# Patient Record
Sex: Female | Born: 1970
Health system: Southern US, Community
[De-identification: ages and names within clinical notes are randomized; demographics above are authoritative.]

## PROBLEM LIST (undated history)

## (undated) DIAGNOSIS — E119 Type 2 diabetes mellitus without complications: Secondary | ICD-10-CM

## (undated) DIAGNOSIS — D509 Iron deficiency anemia, unspecified: Secondary | ICD-10-CM

## (undated) DIAGNOSIS — L83 Acanthosis nigricans: Secondary | ICD-10-CM

## (undated) DIAGNOSIS — F419 Anxiety disorder, unspecified: Secondary | ICD-10-CM

## (undated) DIAGNOSIS — N939 Abnormal uterine and vaginal bleeding, unspecified: Secondary | ICD-10-CM

## (undated) DIAGNOSIS — D649 Anemia, unspecified: Secondary | ICD-10-CM

## (undated) DIAGNOSIS — D219 Benign neoplasm of connective and other soft tissue, unspecified: Secondary | ICD-10-CM

## (undated) DIAGNOSIS — R519 Headache, unspecified: Secondary | ICD-10-CM

## (undated) DIAGNOSIS — R51 Headache: Secondary | ICD-10-CM

## (undated) HISTORY — DX: Iron deficiency anemia, unspecified: D50.9

## (undated) HISTORY — DX: Abnormal uterine and vaginal bleeding, unspecified: N93.9

## (undated) HISTORY — DX: Type 2 diabetes mellitus without complications: E11.9

## (undated) HISTORY — PX: TUBAL LIGATION: SHX77

## (undated) HISTORY — PX: CHOLECYSTECTOMY: SHX55

## (undated) HISTORY — DX: Acanthosis nigricans: L83

## (undated) HISTORY — PX: KNEE ARTHROSCOPY: SUR90

---

## 2005-03-28 ENCOUNTER — Emergency Department: Payer: Self-pay | Admitting: Emergency Medicine

## 2006-01-16 ENCOUNTER — Emergency Department: Payer: Self-pay | Admitting: Emergency Medicine

## 2006-01-16 ENCOUNTER — Other Ambulatory Visit: Payer: Self-pay

## 2007-11-13 ENCOUNTER — Emergency Department: Payer: Self-pay | Admitting: Internal Medicine

## 2008-11-03 ENCOUNTER — Emergency Department: Payer: Self-pay | Admitting: Emergency Medicine

## 2009-11-06 ENCOUNTER — Emergency Department: Payer: Self-pay | Admitting: Internal Medicine

## 2010-11-27 ENCOUNTER — Emergency Department: Payer: Self-pay | Admitting: Emergency Medicine

## 2011-08-25 ENCOUNTER — Emergency Department: Payer: Self-pay | Admitting: Emergency Medicine

## 2012-04-16 ENCOUNTER — Emergency Department: Payer: Self-pay | Admitting: Emergency Medicine

## 2015-04-23 ENCOUNTER — Other Ambulatory Visit: Payer: Self-pay

## 2015-04-23 ENCOUNTER — Other Ambulatory Visit: Payer: Self-pay | Admitting: Family Medicine

## 2015-04-23 DIAGNOSIS — Z1231 Encounter for screening mammogram for malignant neoplasm of breast: Secondary | ICD-10-CM

## 2015-04-24 ENCOUNTER — Emergency Department
Admit: 2015-04-24 | Disposition: A | Discharge status: Home / Self Care | Payer: Self-pay | Admitting: Emergency Medicine

## 2015-04-24 LAB — COMPREHENSIVE METABOLIC PANEL
AST: 26 U/L
Albumin: 4 g/dL
Alkaline Phosphatase: 37 U/L — ABNORMAL LOW
Anion Gap: 7 (ref 7–16)
BUN: 5 mg/dL — ABNORMAL LOW
Bilirubin,Total: <0.1 mg/dL — ABNORMAL LOW
CO2: 25 mmol/L
Calcium, Total: 8.2 mg/dL — ABNORMAL LOW
Chloride: 104 mmol/L
Creatinine: 0.61 mg/dL
GLUCOSE: 177 mg/dL — AB
Potassium: 2.9 mmol/L — ABNORMAL LOW
SGPT (ALT): 10 U/L — ABNORMAL LOW
Sodium: 136 mmol/L
Total Protein: 7.5 g/dL

## 2015-04-24 LAB — URINALYSIS, COMPLETE
BACTERIA: NONE SEEN
Bilirubin,UR: NEGATIVE
Glucose,UR: NEGATIVE mg/dL (ref 0–75)
Ketone: NEGATIVE
NITRITE: NEGATIVE
PROTEIN: NEGATIVE
Ph: 7 (ref 4.5–8.0)
Specific Gravity: 1.017 (ref 1.003–1.030)

## 2015-04-24 LAB — CBC WITH DIFFERENTIAL/PLATELET
Basophil #: 0 10*3/uL (ref 0.0–0.1)
Basophil %: 0.7 %
EOS PCT: 2 %
Eosinophil #: 0.1 10*3/uL (ref 0.0–0.7)
HCT: 19.4 % — AB (ref 35.0–47.0)
HGB: 5.2 g/dL — ABNORMAL LOW (ref 12.0–16.0)
LYMPHS ABS: 1.4 10*3/uL (ref 1.0–3.6)
Lymphocyte %: 36.1 %
MCH: 16.4 pg — ABNORMAL LOW (ref 26.0–34.0)
MCHC: 26.7 g/dL — ABNORMAL LOW (ref 32.0–36.0)
MCV: 62 fL — ABNORMAL LOW (ref 80–100)
MONOS PCT: 7.7 %
Monocyte #: 0.3 x10 3/mm (ref 0.2–0.9)
NEUTROS ABS: 2.1 10*3/uL (ref 1.4–6.5)
Neutrophil %: 53.5 %
Platelet: 347 10*3/uL (ref 150–440)
RBC: 3.16 10*6/uL — ABNORMAL LOW (ref 3.80–5.20)
RDW: 24.4 % — ABNORMAL HIGH (ref 11.5–14.5)
WBC: 4 10*3/uL (ref 3.6–11.0)

## 2015-05-14 ENCOUNTER — Encounter
Admission: RE | Admit: 2015-05-14 | Discharge: 2015-05-14 | Disposition: A | Discharge status: Home / Self Care | Payer: Commercial Managed Care - PPO | Source: Ambulatory Visit | Attending: Obstetrics and Gynecology | Admitting: Obstetrics and Gynecology

## 2015-05-14 DIAGNOSIS — Z01810 Encounter for preprocedural cardiovascular examination: Secondary | ICD-10-CM | POA: Insufficient documentation

## 2015-05-14 DIAGNOSIS — Z01812 Encounter for preprocedural laboratory examination: Secondary | ICD-10-CM | POA: Diagnosis present

## 2015-05-14 DIAGNOSIS — D259 Leiomyoma of uterus, unspecified: Secondary | ICD-10-CM | POA: Insufficient documentation

## 2015-05-14 DIAGNOSIS — D649 Anemia, unspecified: Secondary | ICD-10-CM

## 2015-05-14 HISTORY — DX: Anemia, unspecified: D64.9

## 2015-05-14 HISTORY — DX: Headache, unspecified: R51.9

## 2015-05-14 HISTORY — DX: Headache: R51

## 2015-05-14 LAB — CBC
HCT: 26.7 % — ABNORMAL LOW (ref 35.0–47.0)
Hemoglobin: 7.4 g/dL — ABNORMAL LOW (ref 12.0–16.0)
MCH: 20 pg — ABNORMAL LOW (ref 26.0–34.0)
MCHC: 27.6 g/dL — ABNORMAL LOW (ref 32.0–36.0)
MCV: 72.5 fL — ABNORMAL LOW (ref 80.0–100.0)
Platelets: 530 10*3/uL — ABNORMAL HIGH (ref 150–440)
RBC: 3.68 MIL/uL — ABNORMAL LOW (ref 3.80–5.20)
RDW: 34.6 % — AB (ref 11.5–14.5)
WBC: 8.2 10*3/uL (ref 3.6–11.0)

## 2015-05-14 LAB — ABO/RH: ABO/RH(D): O POS

## 2015-05-14 LAB — DIFFERENTIAL
Basophils Absolute: 0 10*3/uL (ref 0–0.1)
Basophils Relative: 1 %
Eosinophils Absolute: 0.2 10*3/uL (ref 0–0.7)
Eosinophils Relative: 2 %
Lymphs Abs: 1.4 10*3/uL (ref 1.0–3.6)
Monocytes Absolute: 0.5 10*3/uL (ref 0.2–0.9)
NEUTROS ABS: 6.1 10*3/uL (ref 1.4–6.5)
Neutrophils Relative %: 74 %

## 2015-05-14 NOTE — Patient Instructions (Signed)
  Your procedure is scheduled on: May 29, 2015 (Friday) Report to Same Day Surgery. To find out your arrival time please call [phone] between 1PM - 3PM on  May 28, 2015, (Thursday)  Remember: Instructions that are not followed completely may result in serious medical risk, up to and including death, or upon the discretion of your surgeon and anesthesiologist your surgery may need to be rescheduled.    __x_ 1. Do not eat food or drink liquids after midnight. No gum chewing or hard candies.     __x__ 2. No Alcohol for 24 hours before or after surgery.   ____ 3. Bring all medications with you on the day of surgery if instructed.    __x__ 4. Notify your doctor if there is any change in your medical condition     (cold, fever, infections).     Do not wear jewelry, make-up, hairpins, clips or nail polish.  Do not wear lotions, powders, or perfumes. You may wear deodorant.  Do not shave 48 hours prior to surgery. Men may shave face and neck.  Do not bring valuables to the hospital.    [facility] is not responsible for any belongings or valuables.               Contacts, dentures or bridgework may not be worn into surgery.  Leave your suitcase in the car. After surgery it may be brought to your room.  For patients admitted to the hospital, discharge time is determined by your                treatment team.   Patients discharged the day of surgery will not be allowed to drive home.   Please read over the following fact sheets that you were given:   Surgical Site Infection Prevention   ____ Take these medicines the morning of surgery with A SIP OF WATER:    1.  2.   3.   4.  5.  6.  ____ Fleet Enema (as directed)   ____ Use CHG Soap as directed  ____ Use inhalers on the day of surgery  ____ Stop metformin 2 days prior to surgery    ____ Take 1/2 of usual insulin dose the night before surgery and none on the morning of surgery.   ____ Stop Coumadin/Plavix/aspirin on   __x__ Stop Anti-inflammatories on (ten days before surgery)  ____ Stop supplements until after surgery.    ____ Bring C-Pap to the hospital.

## 2015-05-19 ENCOUNTER — Ambulatory Visit: Payer: Self-pay

## 2015-05-20 DIAGNOSIS — N939 Abnormal uterine and vaginal bleeding, unspecified: Secondary | ICD-10-CM | POA: Insufficient documentation

## 2015-05-20 HISTORY — DX: Abnormal uterine and vaginal bleeding, unspecified: N93.9

## 2015-05-25 ENCOUNTER — Encounter: Payer: Self-pay | Admitting: Emergency Medicine

## 2015-05-25 ENCOUNTER — Emergency Department
Admission: EM | Admit: 2015-05-25 | Discharge: 2015-05-25 | Disposition: A | Discharge status: Home / Self Care | Payer: Commercial Managed Care - PPO | Attending: Student | Admitting: Student

## 2015-05-25 DIAGNOSIS — Z79899 Other long term (current) drug therapy: Secondary | ICD-10-CM | POA: Insufficient documentation

## 2015-05-25 DIAGNOSIS — R1030 Lower abdominal pain, unspecified: Secondary | ICD-10-CM

## 2015-05-25 DIAGNOSIS — E876 Hypokalemia: Secondary | ICD-10-CM | POA: Insufficient documentation

## 2015-05-25 DIAGNOSIS — D649 Anemia, unspecified: Secondary | ICD-10-CM

## 2015-05-25 DIAGNOSIS — Z87891 Personal history of nicotine dependence: Secondary | ICD-10-CM | POA: Insufficient documentation

## 2015-05-25 DIAGNOSIS — Z3202 Encounter for pregnancy test, result negative: Secondary | ICD-10-CM | POA: Insufficient documentation

## 2015-05-25 DIAGNOSIS — D259 Leiomyoma of uterus, unspecified: Secondary | ICD-10-CM | POA: Insufficient documentation

## 2015-05-25 HISTORY — DX: Benign neoplasm of connective and other soft tissue, unspecified: D21.9

## 2015-05-25 LAB — CBC WITH DIFFERENTIAL/PLATELET
BASOS ABS: 0.1 10*3/uL (ref 0–0.1)
Basophils Relative: 1 %
Eosinophils Absolute: 0.1 10*3/uL (ref 0–0.7)
Eosinophils Relative: 2 %
HCT: 28.6 % — ABNORMAL LOW (ref 35.0–47.0)
Hemoglobin: 8.6 g/dL — ABNORMAL LOW (ref 12.0–16.0)
LYMPHS ABS: 1.5 10*3/uL (ref 1.0–3.6)
Lymphocytes Relative: 16 %
MCH: 22.2 pg — ABNORMAL LOW (ref 26.0–34.0)
MCHC: 29.9 g/dL — AB (ref 32.0–36.0)
MCV: 74.2 fL — ABNORMAL LOW (ref 80.0–100.0)
Monocytes Absolute: 0.5 10*3/uL (ref 0.2–0.9)
Monocytes Relative: 5 %
NEUTROS PCT: 76 %
Neutro Abs: 7.4 10*3/uL — ABNORMAL HIGH (ref 1.4–6.5)
Platelets: 339 10*3/uL (ref 150–440)
RBC: 3.86 MIL/uL (ref 3.80–5.20)
RDW: 30.3 % — AB (ref 11.5–14.5)
WBC: 9.6 10*3/uL (ref 3.6–11.0)

## 2015-05-25 LAB — URINALYSIS COMPLETE WITH MICROSCOPIC (ARMC ONLY)
BILIRUBIN URINE: NEGATIVE
Glucose, UA: NEGATIVE mg/dL
KETONES UR: NEGATIVE mg/dL
Leukocytes, UA: NEGATIVE
Nitrite: NEGATIVE
PROTEIN: 30 mg/dL — AB
Renal Epithelial: 3
SPECIFIC GRAVITY, URINE: 1.021 (ref 1.005–1.030)
Squamous Epithelial / LPF: NONE SEEN
pH: 6 (ref 5.0–8.0)

## 2015-05-25 LAB — COMPREHENSIVE METABOLIC PANEL
ALBUMIN: 3.5 g/dL (ref 3.5–5.0)
ALK PHOS: 33 U/L — AB (ref 38–126)
ALT: 13 U/L — AB (ref 14–54)
AST: 20 U/L (ref 15–41)
Anion gap: 8 (ref 5–15)
BUN: 6 mg/dL (ref 6–20)
CALCIUM: 8.1 mg/dL — AB (ref 8.9–10.3)
CO2: 23 mmol/L (ref 22–32)
Chloride: 101 mmol/L (ref 101–111)
Creatinine, Ser: 0.59 mg/dL (ref 0.44–1.00)
GFR calc non Af Amer: >60 mL/min (ref >60)
Glucose, Bld: 103 mg/dL — ABNORMAL HIGH (ref 65–99)
Potassium: 2.8 mmol/L — CL (ref 3.5–5.1)
SODIUM: 132 mmol/L — AB (ref 135–145)
Total Bilirubin: 0.5 mg/dL (ref 0.3–1.2)
Total Protein: 7.2 g/dL (ref 6.5–8.1)

## 2015-05-25 LAB — LIPASE, BLOOD: LIPASE: 22 U/L (ref 22–51)

## 2015-05-25 LAB — POCT PREGNANCY, URINE: PREG TEST UR: NEGATIVE

## 2015-05-25 MED ORDER — SODIUM CHLORIDE 0.9 % IV BOLUS (SEPSIS)
1000.0000 mL | Freq: Once | INTRAVENOUS | Status: AC
  Administered 2015-05-25: 1000 mL via INTRAVENOUS

## 2015-05-25 MED ORDER — ONDANSETRON HCL 4 MG/2ML IJ SOLN
4.0000 mg | Freq: Once | INTRAMUSCULAR | Status: AC
  Administered 2015-05-25: 4 mg via INTRAVENOUS

## 2015-05-25 MED ORDER — KETOROLAC TROMETHAMINE 30 MG/ML IJ SOLN
30.0000 mg | Freq: Once | INTRAMUSCULAR | Status: AC
  Administered 2015-05-25: 30 mg via INTRAVENOUS

## 2015-05-25 MED ORDER — MORPHINE SULFATE 4 MG/ML IJ SOLN
INTRAMUSCULAR | Status: AC
  Administered 2015-05-25: 4 mg via INTRAVENOUS
  Filled 2015-05-25: qty 1

## 2015-05-25 MED ORDER — MORPHINE SULFATE 4 MG/ML IJ SOLN
4.0000 mg | Freq: Once | INTRAMUSCULAR | Status: AC
  Administered 2015-05-25: 4 mg via INTRAVENOUS

## 2015-05-25 MED ORDER — OXYCODONE HCL 5 MG PO TABS: 5.0000 mg | ORAL_TABLET | Freq: Four times a day (QID) | ORAL | Status: DC | PRN

## 2015-05-25 MED ORDER — KETOROLAC TROMETHAMINE 30 MG/ML IJ SOLN
INTRAMUSCULAR | Status: AC
  Administered 2015-05-25: 30 mg via INTRAVENOUS
  Filled 2015-05-25: qty 1

## 2015-05-25 MED ORDER — POTASSIUM CHLORIDE CRYS ER 20 MEQ PO TBCR
40.0000 meq | EXTENDED_RELEASE_TABLET | Freq: Once | ORAL | Status: AC
  Administered 2015-05-25: 40 meq via ORAL

## 2015-05-25 MED ORDER — ONDANSETRON HCL 4 MG/2ML IJ SOLN
INTRAMUSCULAR | Status: AC
  Administered 2015-05-25: 4 mg via INTRAVENOUS
  Filled 2015-05-25: qty 2

## 2015-05-25 MED ORDER — POTASSIUM CHLORIDE CRYS ER 20 MEQ PO TBCR
EXTENDED_RELEASE_TABLET | ORAL | Status: AC
  Filled 2015-05-25: qty 2

## 2015-05-25 NOTE — ED Provider Notes (Signed)
[facility] Emergency Department Provider Note  ____________________________________________  Time seen: Approximately 4:49 PM  I have reviewed the triage vital signs and the nursing notes.   HISTORY  Chief Complaint Abdominal Pain    HPI QUINESHA SELINGER is a 44 y.o. female with history of anemia and large uterine fibroids presents for evaluation of 3 days of constant waxing and waning lower abdominal/suprapubic pain. She is scheduled for hysterectomy in 4 days for removal of fibroid uterus for treatment of severe anemia related to menorrhagia. Currently the pain is described as severe. No modifying factors. No nausea, vomiting, diarrhea, fevers or chills. She has had vaginal bleeding since May 20 which is improving.   Past Medical History  Diagnosis Date  . Headache   . Anemia   . Fibroids     There are no active problems to display for this patient.   Past Surgical History  Procedure Laterality Date  . Cholecystectomy    . Tubal ligation    . Knee arthroscopy Right     Current Outpatient Rx  Name  Route  Sig  Dispense  Refill  . ferrous sulfate 325 (65 FE) MG tablet   Oral   Take 325 mg by mouth 3 (three) times daily with meals.         Marland Kitchen ibuprofen (ADVIL,MOTRIN) 200 MG tablet   Oral   Take 600 mg by mouth every 6 (six) hours as needed.         . norethindrone (AYGESTIN) 5 MG tablet   Oral   Take 5 mg by mouth daily.           Allergies Review of patient's allergies indicates no known allergies.  No family history on file.  Social History History  Substance Use Topics  . Smoking status: Former Smoker -- 1.00 packs/day    Types: Cigarettes    Quit date: 04/25/2012  . Smokeless tobacco: Never Used  . Alcohol Use: No    Review of Systems Constitutional: No fever/chills Eyes: No visual changes. ENT: No sore throat. Cardiovascular: Denies chest pain. Respiratory: Denies shortness of breath. Gastrointestinal: +abdominal  pain.  No nausea, no vomiting.  No diarrhea.  No constipation. Genitourinary: Negative for dysuria. Musculoskeletal: Negative for back pain. Skin: Negative for rash. Neurological: Negative for headaches, focal weakness or numbness.  10-point ROS otherwise negative.  ____________________________________________   PHYSICAL EXAM:  VITAL SIGNS: ED Triage Vitals  Enc Vitals Group     BP 05/25/15 1634 103/56 mmHg     Pulse Rate 05/25/15 1634 77     Resp 05/25/15 1634 20     Temp 05/25/15 1634 98.4 F (36.9 C)     Temp Source 05/25/15 1634 Oral     SpO2 05/25/15 1634 100 %     Weight 05/25/15 1634 220 lb (99.791 kg)     Height 05/25/15 1634 5\' 4"  (1.626 m)     Head Cir --      Peak Flow --      Pain Score 05/25/15 1635 10     Pain Loc --      Pain Edu? --      Excl. in [place]? --     Constitutional: Alert and oriented. In mild distress secondary to pain. Eyes: Conjunctivae are normal. PERRL. EOMI. Head: Atraumatic. Nose: No congestion/rhinnorhea. Mouth/Throat: Mucous membranes are moist.  Oropharynx non-erythematous. Neck: No stridor.   Cardiovascular: Normal rate, regular rhythm. Grossly normal heart sounds.  Good peripheral circulation. Respiratory: Normal  respiratory effort.  No retractions. Lungs CTAB. Gastrointestinal: Soft with palpable nontender uterus, fundus palpated just under the umbilicus. No distention. No abdominal bruits. No CVA tenderness. Genitourinary: deferred Musculoskeletal: No lower extremity tenderness nor edema.  No joint effusions. Neurologic:  Normal speech and language. No gross focal neurologic deficits are appreciated. Speech is normal. No gait instability. Skin:  Skin is warm, dry and intact. No rash noted. Psychiatric: Mood and affect are normal. Speech and behavior are normal.  ____________________________________________   LABS (all labs ordered are listed, but only abnormal results are displayed)  Labs Reviewed  CBC WITH  DIFFERENTIAL/PLATELET - Abnormal; Notable for the following:    Hemoglobin 8.6 (*)    HCT 28.6 (*)    MCV 74.2 (*)    MCH 22.2 (*)    MCHC 29.9 (*)    RDW 30.3 (*)    Neutro Abs 7.4 (*)    All other components within normal limits  COMPREHENSIVE METABOLIC PANEL - Abnormal; Notable for the following:    Sodium 132 (*)    Potassium 2.8 (*)    Glucose, Bld 103 (*)    Calcium 8.1 (*)    ALT 13 (*)    Alkaline Phosphatase 33 (*)    All other components within normal limits  URINALYSIS COMPLETEWITH MICROSCOPIC ([facility] ONLY) - Abnormal; Notable for the following:    Color, Urine YELLOW (*)    APPearance HAZY (*)    Hgb urine dipstick 3+ (*)    Protein, ur 30 (*)    Bacteria, UA RARE (*)    All other components within normal limits  LIPASE, BLOOD  POC URINE PREG, ED  POCT PREGNANCY, URINE   ____________________________________________  EKG  none ____________________________________________  RADIOLOGY  none ____________________________________________   PROCEDURES  Procedure(s) performed: None  Critical Care performed: No  ____________________________________________   INITIAL IMPRESSION / ASSESSMENT AND PLAN / ED COURSE  Pertinent labs & imaging results that were available during my care of the patient were reviewed by me and considered in my medical decision making (see chart for details).  ----------------------------------------- 8:32 PM on 05/25/2015 -----------------------------------------   [name] is a 44 y.o. female with history of anemia and large uterine fibroids presents for evaluation of 3 days of constant waxing and waning lower abdominal/suprapubic pain secondary to known enlarged fibroid uterus. Labs notable for improving anemia. She also has chronic hypokalemia. K is 2.8 today and her potassium was orally repleted. She has mild hyponatremia. At this point her pain has completely resolved with 4 mg of morphine, 30 mg of Toradol. She is now  sitting up in bed texting on her phone. I discussed the case with Dr. Leafy Ro of OB/GYN and she will have her office contact the patient tomorrow for follow-up appointment. I've discussed extensive return precautions with the patient and she and her husband are comfortable with the discharge plan. Vital signs stable. Afebrile. ____________________________________________   FINAL CLINICAL IMPRESSION(S) / ED DIAGNOSES  Final diagnoses:  Uterine leiomyoma, unspecified location  Lower abdominal pain  Hypokalemia  Anemia, unspecified anemia type      Joanne Gavel, MD 05/25/15 2035

## 2015-05-25 NOTE — ED Notes (Signed)
States abdominal pain x 2 days, has history of uterine fibroid with scheduled hysterectomy and vaginal bleeding since May 20th

## 2015-05-27 NOTE — H&P (Signed)
From [facility] GYN OFFICE VISIT 04/29/2015   Amanda Moran 05-03-1971  HPI: 44 y.o. female, G3P3003, presents with chief complaint of heavy menstrual bleeding leading to microcytic anemia. She was recently seen for a physical and Hgb 5.2 was identified. She was sent to ER at [facility] and monitored. No si/sx of anemia meriting transfusion.  She quantifies this bleeding as: Patient's last menstrual period was 04/21/2015. Menstrual frequency 28 days Length 7 days Pads/day: 10-12 pads Clotting: yes Night time sx yes Overall quality of life impact: Not significantly, she has always dealt with it. Was waiting for menopause.   These symptoms have been present for 5 years. She has tried no treatement in the past for these symptoms without improvement. She has been to the emergency room for this problem in the past.   Anemia symptoms:  Headaches: None Palpitations: None Shortness of breath: None Excessive fatigue: None Dizziness: None Pica: None  Associated symptoms:  Fever, chills, increasing abdominal girth, pelvic pain/pressure, bowel or bladder dysfunction all negative  GYN specific history:  Contraception: BTL STI: recent trichomonas infection Paps: NILM recently, never needed colposcopy Recent sexual activity: Yes-with spouse Desire for future pregnancy: none Weight history: recent weight loss with that is intentional   Associated medical illnesses:  Bleeding disorder, connective tissue disease, liver/renal/heart disease all negative.  Current medications Current Outpatient Prescriptions  Medication Sig  . ferrous sulfate 325 (65 FE) MG tablet Take 325 mg by mouth daily with breakfast.  . norethindrone (AYGESTIN) 5 mg tablet Take 5 mg by mouth once daily.   Herbal medications?: None No use of SSRI, anticoagulants, antipsychotics, tamoxifen.   Family history:  Coagulation or thromboembolic disorders: None Hormone-sensitive cancers: Maternal sister breast, maternal sister aunt,  maternal sister ovarian, mom had cervical cancer.    See the ROS below, but she specifically denies lightheadness, excessive fatigue, or changes in skin color.    Review of Systems: positives in bold GEN:  fevers, chills, weight changes, appetite changes, fatigue, night sweats HEENT: HA, vision changes, hearing loss, congestion, rhinorrhea, sinus pressure, dysphagia CV:  CP, palpitations PULM: SOB, cough GI: abd pain, N/V/D/C GU: dysuria, urgency, frequency MSK: arthralgias, myalgias, back pain, swelling SKIN: rashes, color changes, pallor NEURO: numbness, weakness, tingling, seizures, dizziness, tremors PSYCH: depression, anxiety, behavioral problems, confusion  HEME/LYMPH: easy bruising or bleeding ENDO: heat/cold intolerance   PMH: Past Medical History  Diagnosis Date  . Fibroid   . Anemia     PSH: Past Surgical History  Procedure Laterality Date  . Tubal ligation    . Cholecystectomy       PE: BP 138/70 mmHg  Pulse 106  Ht 162.6 cm (5\' 4" )  Wt 102.059 kg (225 lb)  BMI 38.60 kg/m2  LMP 04/21/2015 GEN: NAD, well-groomed, well-hydrated HEENT: Thyroid normal size, neck supple. No conjunctival pallor.  CV/Pulm: RRR, CTAB ABD: Soft, NT, ND, +BS GU: Normal vulva; Normal, well-estrogenized vaginal tissue; Cervix benign appearing; no CMT; Uterus palpable to 16 cm (likely pedunculated mass); No adnexal masses Ext: 5/5 strength, warm and well-perfused. Cap refill <2 sec.   Previous Imaging: TVUS on 4/29 at [facility]: 13.8 x 10.5 x 9.8 cm uterus with multiple fibroids: 7 x 7 c 5 cm anterior fundal intramural 7 x 6 x 6 cm LUS intramural 4 x 4 x 3 cm right lateral fundal 6 x 7 x 5 cm right lateral pedunculated Normal ovaries with addition of right hydrosalpinx.    Pertinent lab work: CBC 4.0\5.2/347 MCV 62 Cr 0.61  Endometrial  biopsy After verbal consent the  cervix was examined using Graves speculum and found to be benign appearing. The cervix was cleaned with betadine x 3. Hurricane spray was used to provide pain relief. The cervix was grasped with a single-tooth tenaculum. The endometrial biopsy Pipelle was placed to 9 cm depth and sufficient sample was obtained in 2 passes. All instruments were removed from the vagina.   A/P: 44 y.o. presents with large uterine fibroids  1. AUB: In order to treat Ms. Chow's anemia and uterine fibroids we recommend preoperative blood transfusion and minimally invasive total laparoscopic hysterectomy with bilateral salpingectomy, power morcellation, possible abdominal hysterectomy, possible cystoscopy. The risks of this procedure include pain, bleeding, infection, damage to surrounding structures, conversion to open surgery. The methods used to alleviate these risks were discussed and patient consents to this procedure in a manner consistent with informed consent. Pt is agreeable to blood transfusion if necessary to save her life or optimize her health postoperatively. The operative permit and blood consent were signed to reflect this. All questions were answered to Ms. Rusk's satisfaction. Plan for surgery June 3rd. Pt to proceed for anesthesia visit. Plan to continue aygestin and FeSO4 during this time in preparation.   RTC postop  I spent 30 minutes of a 45 minute visit involved in face-to-face counseling regarding this issue.

## 2015-05-28 ENCOUNTER — Ambulatory Visit
Admission: RE | Admit: 2015-05-28 | Discharge: 2015-05-28 | Disposition: A | Discharge status: Home / Self Care | Payer: Commercial Managed Care - PPO | Source: Ambulatory Visit | Attending: *Deleted | Admitting: *Deleted

## 2015-05-28 DIAGNOSIS — Z808 Family history of malignant neoplasm of other organs or systems: Secondary | ICD-10-CM | POA: Diagnosis not present

## 2015-05-28 DIAGNOSIS — N939 Abnormal uterine and vaginal bleeding, unspecified: Secondary | ICD-10-CM | POA: Diagnosis not present

## 2015-05-28 DIAGNOSIS — Z803 Family history of malignant neoplasm of breast: Secondary | ICD-10-CM | POA: Diagnosis not present

## 2015-05-28 DIAGNOSIS — N840 Polyp of corpus uteri: Secondary | ICD-10-CM | POA: Diagnosis not present

## 2015-05-28 DIAGNOSIS — Z8041 Family history of malignant neoplasm of ovary: Secondary | ICD-10-CM | POA: Diagnosis not present

## 2015-05-28 DIAGNOSIS — D251 Intramural leiomyoma of uterus: Secondary | ICD-10-CM | POA: Diagnosis not present

## 2015-05-28 DIAGNOSIS — D6489 Other specified anemias: Secondary | ICD-10-CM | POA: Diagnosis not present

## 2015-05-28 DIAGNOSIS — Z9049 Acquired absence of other specified parts of digestive tract: Secondary | ICD-10-CM | POA: Diagnosis not present

## 2015-05-28 DIAGNOSIS — Z87891 Personal history of nicotine dependence: Secondary | ICD-10-CM | POA: Diagnosis not present

## 2015-05-28 DIAGNOSIS — D649 Anemia, unspecified: Secondary | ICD-10-CM | POA: Diagnosis present

## 2015-05-28 DIAGNOSIS — Z79899 Other long term (current) drug therapy: Secondary | ICD-10-CM | POA: Diagnosis not present

## 2015-05-28 LAB — PREPARE RBC (CROSSMATCH)

## 2015-05-28 LAB — POTASSIUM: POTASSIUM, SERUM: 3.6

## 2015-05-28 MED ORDER — DIPHENHYDRAMINE HCL 25 MG PO CAPS
25.0000 mg | ORAL_CAPSULE | Freq: Once | ORAL | Status: AC
  Administered 2015-05-28: 25 mg via ORAL

## 2015-05-28 MED ORDER — DIPHENHYDRAMINE HCL 25 MG PO CAPS
ORAL_CAPSULE | ORAL | Status: AC
  Filled 2015-05-28: qty 1

## 2015-05-28 MED ORDER — ACETAMINOPHEN 325 MG PO TABS
ORAL_TABLET | ORAL | Status: AC
  Filled 2015-05-28: qty 2

## 2015-05-28 MED ORDER — SODIUM CHLORIDE 0.9 % IV SOLN
INTRAVENOUS | Status: DC
  Administered 2015-05-28: 09:00:00 via INTRAVENOUS

## 2015-05-28 MED ORDER — ACETAMINOPHEN 325 MG PO TABS
650.0000 mg | ORAL_TABLET | Freq: Once | ORAL | Status: AC
  Administered 2015-05-28: 650 mg via ORAL

## 2015-05-29 ENCOUNTER — Ambulatory Visit: Payer: Commercial Managed Care - PPO | Admitting: Anesthesiology

## 2015-05-29 ENCOUNTER — Encounter
Admission: RE | Disposition: A | Discharge status: Home / Self Care | Payer: Self-pay | Source: Ambulatory Visit | Attending: Obstetrics and Gynecology

## 2015-05-29 ENCOUNTER — Observation Stay
Admission: RE | Admit: 2015-05-29 | Discharge: 2015-05-31 | Disposition: A | Discharge status: Home / Self Care | Payer: Commercial Managed Care - PPO | Source: Ambulatory Visit | Attending: Obstetrics and Gynecology | Admitting: Obstetrics and Gynecology

## 2015-05-29 ENCOUNTER — Encounter: Payer: Self-pay | Admitting: Certified Registered Nurse Anesthetist

## 2015-05-29 DIAGNOSIS — Z79899 Other long term (current) drug therapy: Secondary | ICD-10-CM | POA: Insufficient documentation

## 2015-05-29 DIAGNOSIS — Z8041 Family history of malignant neoplasm of ovary: Secondary | ICD-10-CM | POA: Insufficient documentation

## 2015-05-29 DIAGNOSIS — Z87891 Personal history of nicotine dependence: Secondary | ICD-10-CM | POA: Insufficient documentation

## 2015-05-29 DIAGNOSIS — D6489 Other specified anemias: Principal | ICD-10-CM | POA: Insufficient documentation

## 2015-05-29 DIAGNOSIS — Z808 Family history of malignant neoplasm of other organs or systems: Secondary | ICD-10-CM | POA: Insufficient documentation

## 2015-05-29 DIAGNOSIS — N840 Polyp of corpus uteri: Secondary | ICD-10-CM | POA: Insufficient documentation

## 2015-05-29 DIAGNOSIS — D251 Intramural leiomyoma of uterus: Secondary | ICD-10-CM | POA: Insufficient documentation

## 2015-05-29 DIAGNOSIS — D259 Leiomyoma of uterus, unspecified: Secondary | ICD-10-CM | POA: Diagnosis present

## 2015-05-29 DIAGNOSIS — N939 Abnormal uterine and vaginal bleeding, unspecified: Secondary | ICD-10-CM | POA: Insufficient documentation

## 2015-05-29 DIAGNOSIS — Z9049 Acquired absence of other specified parts of digestive tract: Secondary | ICD-10-CM | POA: Insufficient documentation

## 2015-05-29 DIAGNOSIS — D649 Anemia, unspecified: Secondary | ICD-10-CM | POA: Diagnosis present

## 2015-05-29 DIAGNOSIS — Z803 Family history of malignant neoplasm of breast: Secondary | ICD-10-CM | POA: Insufficient documentation

## 2015-05-29 DIAGNOSIS — D252 Subserosal leiomyoma of uterus: Secondary | ICD-10-CM

## 2015-05-29 HISTORY — PX: ABDOMINAL HYSTERECTOMY: SHX81

## 2015-05-29 HISTORY — PX: LAPAROSCOPIC SUPRACERVICAL HYSTERECTOMY: SHX5399

## 2015-05-29 LAB — TYPE AND SCREEN
ABO/RH(D): O POS
ABO/RH(D): O POS
ANTIBODY SCREEN: NEGATIVE
Antibody Screen: NEGATIVE
UNIT DIVISION: 0
UNIT DIVISION: 0

## 2015-05-29 LAB — CBC
HCT: 33 % — ABNORMAL LOW (ref 35.0–47.0)
HEMOGLOBIN: 10.1 g/dL — AB (ref 12.0–16.0)
MCH: 23.6 pg — ABNORMAL LOW (ref 26.0–34.0)
MCHC: 30.5 g/dL — AB (ref 32.0–36.0)
MCV: 77.5 fL — ABNORMAL LOW (ref 80.0–100.0)
Platelets: 216 10*3/uL (ref 150–440)
RBC: 4.26 MIL/uL (ref 3.80–5.20)
RDW: 27 % — ABNORMAL HIGH (ref 11.5–14.5)
WBC: 13 10*3/uL — ABNORMAL HIGH (ref 3.6–11.0)

## 2015-05-29 LAB — BASIC METABOLIC PANEL
Anion gap: 9 (ref 5–15)
BUN: 10 mg/dL (ref 6–20)
CALCIUM: 8.8 mg/dL — AB (ref 8.9–10.3)
CO2: 27 mmol/L (ref 22–32)
Chloride: 103 mmol/L (ref 101–111)
Creatinine, Ser: 0.66 mg/dL (ref 0.44–1.00)
GFR calc non Af Amer: >60 mL/min (ref >60)
GLUCOSE: 165 mg/dL — AB (ref 65–99)
Potassium: 3.4 mmol/L — ABNORMAL LOW (ref 3.5–5.1)
SODIUM: 139 mmol/L (ref 135–145)

## 2015-05-29 LAB — HEMOGLOBIN AND HEMATOCRIT, BLOOD
HCT: 32.4 % — ABNORMAL LOW (ref 35.0–47.0)
Hemoglobin: 10 g/dL — ABNORMAL LOW (ref 12.0–16.0)

## 2015-05-29 LAB — ABO/RH: ABO/RH(D): O POS

## 2015-05-29 SURGERY — HYSTERECTOMY, SUPRACERVICAL, LAPAROSCOPIC
Anesthesia: General | Wound class: Clean Contaminated

## 2015-05-29 MED ORDER — ONDANSETRON HCL 4 MG/2ML IJ SOLN: 4.0000 mg | Freq: Four times a day (QID) | INTRAMUSCULAR | Status: DC | PRN

## 2015-05-29 MED ORDER — KCL IN DEXTROSE-NACL 20-5-0.45 MEQ/L-%-% IV SOLN
INTRAVENOUS | Status: DC
  Administered 2015-05-29 – 2015-05-30 (×4): via INTRAVENOUS
  Filled 2015-05-29 (×8): qty 1000

## 2015-05-29 MED ORDER — ESMOLOL HCL 10 MG/ML IV SOLN
INTRAVENOUS | Status: DC | PRN
  Administered 2015-05-29: 20 mg via INTRAVENOUS
  Administered 2015-05-29 (×3): 10 mg via INTRAVENOUS

## 2015-05-29 MED ORDER — NALOXONE HCL 0.4 MG/ML IJ SOLN: 0.4000 mg | INTRAMUSCULAR | Status: DC | PRN

## 2015-05-29 MED ORDER — EPHEDRINE SULFATE 50 MG/ML IJ SOLN
INTRAMUSCULAR | Status: DC | PRN
  Administered 2015-05-29: 10 mg via INTRAVENOUS

## 2015-05-29 MED ORDER — GLYCOPYRROLATE 0.2 MG/ML IJ SOLN
INTRAMUSCULAR | Status: DC | PRN
  Administered 2015-05-29: .15 mg via INTRAVENOUS

## 2015-05-29 MED ORDER — DIPHENHYDRAMINE HCL 50 MG/ML IJ SOLN: 12.5000 mg | Freq: Four times a day (QID) | INTRAMUSCULAR | Status: DC | PRN

## 2015-05-29 MED ORDER — MIDAZOLAM HCL 2 MG/2ML IJ SOLN
INTRAMUSCULAR | Status: DC | PRN
  Administered 2015-05-29: 2 mg via INTRAVENOUS

## 2015-05-29 MED ORDER — ROCURONIUM BROMIDE 100 MG/10ML IV SOLN
INTRAVENOUS | Status: DC | PRN
  Administered 2015-05-29: 5 mg via INTRAVENOUS
  Administered 2015-05-29: 20 mg via INTRAVENOUS

## 2015-05-29 MED ORDER — LIDOCAINE HCL (CARDIAC) 20 MG/ML IV SOLN
INTRAVENOUS | Status: DC | PRN
  Administered 2015-05-29: 40 mg via INTRAVENOUS

## 2015-05-29 MED ORDER — ONDANSETRON HCL 4 MG/2ML IJ SOLN
INTRAMUSCULAR | Status: AC
  Administered 2015-05-29: 4 mg
  Filled 2015-05-29: qty 2

## 2015-05-29 MED ORDER — MORPHINE SULFATE (PF) 1 MG/ML IV SOLN
INTRAVENOUS | Status: DC
  Administered 2015-05-29: 14:00:00 via INTRAVENOUS
  Filled 2015-05-29: qty 25

## 2015-05-29 MED ORDER — CEFAZOLIN SODIUM-DEXTROSE 2-3 GM-% IV SOLR
INTRAVENOUS | Status: AC
  Administered 2015-05-29: 2 g via INTRAVENOUS
  Filled 2015-05-29: qty 50

## 2015-05-29 MED ORDER — SIMETHICONE 80 MG PO CHEW
80.0000 mg | CHEWABLE_TABLET | Freq: Three times a day (TID) | ORAL | Status: DC
  Administered 2015-05-29 – 2015-05-31 (×7): 80 mg via ORAL
  Filled 2015-05-29 (×7): qty 1

## 2015-05-29 MED ORDER — LACTATED RINGERS IV SOLN
INTRAVENOUS | Status: DC
  Administered 2015-05-29 (×2): via INTRAVENOUS

## 2015-05-29 MED ORDER — ONDANSETRON HCL 4 MG/2ML IJ SOLN
INTRAMUSCULAR | Status: DC | PRN
  Administered 2015-05-29: 4 mg via INTRAVENOUS

## 2015-05-29 MED ORDER — BUPIVACAINE HCL (PF) 0.5 % IJ SOLN
INTRAMUSCULAR | Status: AC
  Filled 2015-05-29: qty 30

## 2015-05-29 MED ORDER — FENTANYL CITRATE (PF) 100 MCG/2ML IJ SOLN
25.0000 ug | INTRAMUSCULAR | Status: DC | PRN
  Administered 2015-05-29 (×4): 25 ug via INTRAVENOUS

## 2015-05-29 MED ORDER — ONDANSETRON HCL 4 MG/2ML IJ SOLN: 4.0000 mg | Freq: Once | INTRAMUSCULAR | Status: DC | PRN

## 2015-05-29 MED ORDER — FENTANYL CITRATE (PF) 100 MCG/2ML IJ SOLN
INTRAMUSCULAR | Status: AC
  Administered 2015-05-29: 25 ug via INTRAVENOUS
  Filled 2015-05-29: qty 2

## 2015-05-29 MED ORDER — SODIUM CHLORIDE 0.9 % IJ SOLN: 9.0000 mL | INTRAMUSCULAR | Status: DC | PRN

## 2015-05-29 MED ORDER — FENTANYL CITRATE (PF) 100 MCG/2ML IJ SOLN
INTRAMUSCULAR | Status: DC | PRN
  Administered 2015-05-29: 25 ug via INTRAVENOUS
  Administered 2015-05-29: 100 ug via INTRAVENOUS
  Administered 2015-05-29: 50 ug via INTRAVENOUS
  Administered 2015-05-29: 25 ug via INTRAVENOUS
  Administered 2015-05-29: 100 ug via INTRAVENOUS
  Administered 2015-05-29: 50 ug via INTRAVENOUS

## 2015-05-29 MED ORDER — CEFAZOLIN SODIUM-DEXTROSE 2-3 GM-% IV SOLR
2.0000 g | Freq: Once | INTRAVENOUS | Status: AC
  Administered 2015-05-29: 2 g via INTRAVENOUS

## 2015-05-29 MED ORDER — PROPOFOL 10 MG/ML IV BOLUS
INTRAVENOUS | Status: DC | PRN
Start: 2015-05-29 — End: 2015-05-29
  Administered 2015-05-29: 170 mg via INTRAVENOUS

## 2015-05-29 MED ORDER — KETOROLAC TROMETHAMINE 30 MG/ML IJ SOLN
30.0000 mg | Freq: Four times a day (QID) | INTRAMUSCULAR | Status: DC
  Administered 2015-05-29 – 2015-05-30 (×4): 30 mg via INTRAVENOUS
  Filled 2015-05-29 (×9): qty 1

## 2015-05-29 MED ORDER — DIPHENHYDRAMINE HCL 12.5 MG/5ML PO ELIX
12.5000 mg | ORAL_SOLUTION | Freq: Four times a day (QID) | ORAL | Status: DC | PRN
  Filled 2015-05-29: qty 5

## 2015-05-29 MED ORDER — DEXAMETHASONE SODIUM PHOSPHATE 4 MG/ML IJ SOLN
INTRAMUSCULAR | Status: DC | PRN
  Administered 2015-05-29: 5 mg via INTRAVENOUS

## 2015-05-29 MED ORDER — SUCCINYLCHOLINE CHLORIDE 20 MG/ML IJ SOLN
INTRAMUSCULAR | Status: DC | PRN
  Administered 2015-05-29: 100 mg via INTRAVENOUS

## 2015-05-29 MED ORDER — KETOROLAC TROMETHAMINE 30 MG/ML IJ SOLN
30.0000 mg | Freq: Four times a day (QID) | INTRAMUSCULAR | Status: DC
  Filled 2015-05-29 (×5): qty 1

## 2015-05-29 MED ORDER — BUPIVACAINE HCL 0.5 % IJ SOLN
INTRAMUSCULAR | Status: DC | PRN
  Administered 2015-05-29: 5 mL

## 2015-05-29 MED ORDER — KETOROLAC TROMETHAMINE 60 MG/2ML IM SOLN
INTRAMUSCULAR | Status: AC
  Administered 2015-05-29: 30 mg
  Filled 2015-05-29: qty 2

## 2015-05-29 SURGICAL SUPPLY — 48 items
BAG URO DRAIN 2000ML W/SPOUT (MISCELLANEOUS) ×4 IMPLANT
BLADE SURG SZ10 CARB STEEL (BLADE) ×4 IMPLANT
BLADE SURG SZ11 CARB STEEL (BLADE) ×8 IMPLANT
BLADE SURG SZ20 CARB STEEL (BLADE) ×4 IMPLANT
CATH FOLEY 2WAY  5CC 16FR (CATHETERS) ×2
CATH URTH 16FR FL 2W BLN LF (CATHETERS) ×2 IMPLANT
CELL SAVER ADDITIONAL TIME PER (MISCELLANEOUS) ×120
CELL SAVER AUTOCOAGULATE (MISCELLANEOUS) ×8
CELL SAVER COLL SVCS (MISCELLANEOUS) ×8
CHLORAPREP W/TINT 26ML (MISCELLANEOUS) ×4 IMPLANT
CLOSURE WOUND 1/2 X4 (GAUZE/BANDAGES/DRESSINGS) ×1
COUNTER NEEDLE 20/40 LG (NEEDLE) ×4 IMPLANT
DRSG TEGADERM 2-3/8X2-3/4 SM (GAUZE/BANDAGES/DRESSINGS) ×4 IMPLANT
ELECT BLADE 6.5 EXT (BLADE) ×4 IMPLANT
FILTER LAP SMOKE EVAC STRL (MISCELLANEOUS) ×4 IMPLANT
GAUZE SPONGE NON-WVN 2X2 STRL (MISCELLANEOUS) ×2 IMPLANT
GLOVE BIO SURGEON STRL SZ8 (GLOVE) ×4 IMPLANT
GOWN STRL REUS W/ TWL LRG LVL3 (GOWN DISPOSABLE) ×2 IMPLANT
GOWN STRL REUS W/ TWL XL LVL3 (GOWN DISPOSABLE) ×2 IMPLANT
GOWN STRL REUS W/TWL LRG LVL3 (GOWN DISPOSABLE) ×2
GOWN STRL REUS W/TWL XL LVL3 (GOWN DISPOSABLE) ×2
HANDLE YANKAUER SUCT BULB TIP (MISCELLANEOUS) ×4 IMPLANT
IV LACTATED RINGERS 1000ML (IV SOLUTION) ×4 IMPLANT
KIT RM TURNOVER CYSTO AR (KITS) ×4 IMPLANT
LABEL OR SOLS (LABEL) ×4 IMPLANT
NS IRRIG 500ML POUR BTL (IV SOLUTION) ×4 IMPLANT
OCCLUDER COLPOPNEUMO (BALLOONS) ×4 IMPLANT
PACK GYN LAPAROSCOPIC (MISCELLANEOUS) ×4 IMPLANT
PAD OB MATERNITY 4.3X12.25 (PERSONAL CARE ITEMS) ×4 IMPLANT
PAD PREP 24X41 OB/GYN DISP (PERSONAL CARE ITEMS) ×4 IMPLANT
PENCIL ELECTRO HAND CTR (MISCELLANEOUS) ×4 IMPLANT
SAVE CELL AUTOCOAG (MISCELLANEOUS) ×4 IMPLANT
SAVER CELL COLL SVCS (MISCELLANEOUS) ×4 IMPLANT
SET CYSTO W/LG BORE CLAMP LF (SET/KITS/TRAYS/PACK) ×4 IMPLANT
SOLUTION ELECTROLUBE (MISCELLANEOUS) ×4 IMPLANT
SPONGE LAP 18X18 5 PK (GAUZE/BANDAGES/DRESSINGS) ×8 IMPLANT
SPONGE VERSALON 2X2 STRL (MISCELLANEOUS) ×2
STRIP CLOSURE SKIN 1/2X4 (GAUZE/BANDAGES/DRESSINGS) ×3 IMPLANT
SUT VIC AB 0 CT1 27 (SUTURE) ×8
SUT VIC AB 0 CT1 27XCR 8 STRN (SUTURE) ×8 IMPLANT
SUT VIC AB 2-0 UR6 27 (SUTURE) ×4 IMPLANT
SUTURE MONOCRYL 2-0 UR6 VIOLOT (SUTURE) ×4 IMPLANT
SYR 50ML LL SCALE MARK (SYRINGE) ×4 IMPLANT
SYRINGE 10CC LL (SYRINGE) ×4 IMPLANT
TIME ADDITIONAL PER CELL SAVER (MISCELLANEOUS) ×60 IMPLANT
TROCAR ENDO BLADELESS 11MM (ENDOMECHANICALS) ×4 IMPLANT
TROCAR XCEL UNIV SLVE 11M 100M (ENDOMECHANICALS) ×4 IMPLANT
TUBING INSUFFLATOR HEATED (MISCELLANEOUS) ×4 IMPLANT

## 2015-05-29 NOTE — H&P (Signed)
H&P Update  Pt was last seen on 5/4 and complete history and physical performed.  The surgical history has been reviewed and remains accurate without interval change. The patient was re-examined and patient's physiologic condition has not changed significantly in the last 30 days.  No new pharmacological allergies or types of therapy has been initiated.  No Known Allergies Current Facility-Administered Medications  Medication Dose Route Frequency Provider Last Rate Last Dose  . ceFAZolin (ANCEF) 2-3 GM-% IVPB SOLR           . ceFAZolin (ANCEF) IVPB 2 g/50 mL premix  2 g Intravenous Once Ruffin Frederick, MD      . lactated ringers infusion   Intravenous Continuous Gunnar Fusi, MD 75 mL/hr at 05/29/15 0802     Facility-Administered Medications Ordered in Other Encounters  Medication Dose Route Frequency Provider Last Rate Last Dose  . 0.9 %  sodium chloride infusion   Intravenous Continuous Ruffin Frederick, MD 20 mL/hr at 05/28/15 [phone]       Past Medical History  Diagnosis Date  . Headache   . Anemia   . Fibroids    Past Surgical History  Procedure Laterality Date  . Cholecystectomy    . Tubal ligation    . Knee arthroscopy Right     BP 124/61 mmHg  Pulse 93  Temp(Src) 98.6 F (37 C) (Oral)  Resp 16  SpO2 100%  NAD RRR no murmurs CTAB, no wheezing, resps unlabored +BS, soft, NTTP No c/c/e Pelvic exam deferred  The above history was confirmed with the patient. The condition still exists that makes this procedure necessary. Surgical plan includes TLH with BS, morcellation likely necesasry confirmed on the consent. The treatment plan remains the same, without new options for care.  The patient understands the potential benefits and risks and the consents have been signed and placed on the chart. We discussed the ~25% likelihood of converting to open surgery and the expected results of that outcome as well. Spent 15 minutes with family asking all questions.    Amanda Moran. Archie Balboa MD, [facility] [phone]

## 2015-05-29 NOTE — Transfer of Care (Signed)
Immediate Anesthesia Transfer of Care Note  Patient: Amanda Moran  Procedure(s) Performed: Procedure(s) with comments: LAPAROSCOPIC SUPRACERVICAL HYSTERECTOMY (N/A) - attempted HYSTERECTOMY ABDOMINAL  Patient Location: PACU  Anesthesia Type:General  Level of Consciousness: awake, oriented and patient cooperative  Airway & Oxygen Therapy: Patient Spontanous Breathing and Patient connected to face mask oxygen  Post-op Assessment: Report given to RN and Post -op Vital signs reviewed and stable  Post vital signs: Reviewed and stable  Last Vitals:  Filed Vitals:   05/29/15 0754  BP: 124/61  Pulse: 93  Temp: 37 C  Resp: 16    Complications: No apparent anesthesia complications

## 2015-05-29 NOTE — Anesthesia Postprocedure Evaluation (Signed)
  Anesthesia Post-op Note  Patient: Amanda Moran  Procedure(s) Performed: Procedure(s) with comments: LAPAROSCOPIC SUPRACERVICAL HYSTERECTOMY (N/A) - attempted HYSTERECTOMY ABDOMINAL  Anesthesia type:General  Patient location: PACU  Post pain: Pain level controlled  Post assessment: Post-op Vital signs reviewed, Patient's Cardiovascular Status Stable, Respiratory Function Stable, Patent Airway and No signs of Nausea or vomiting  Post vital signs: Reviewed and stable  Last Vitals:  Filed Vitals:   05/29/15 0754  BP: 124/61  Pulse: 93  Temp: 37 C  Resp: 16    Level of consciousness: awake, alert  and patient cooperative  Complications: No apparent anesthesia complications

## 2015-05-29 NOTE — Op Note (Signed)
Operatative Note  Pre-Op Dx:   1. Uterine fibroids 2. Anemia 3. Abnormal uterine bleeding-L  Post-Op Dx:   same  Procedure:   Diagnostic laparoscopy  Total abdominal hysterectomy  Bilateral salpingectomy   Surgeon:  Nathaneil Canary MD, MPH Assistant(s):  Laverta Baltimore Staff:  Circulator: Lazaro Arms, RN Scrub Person: Judithe Modest  Anesthesia:  Randolm Idol  Antibiotics:  Ancef 2g  at 0848 Incision Time:  0945 End Time: 1148 DVT prophylaxis: SCDs   EBL:  575cc (250 cc given back via cell-saver) IVF:  1850 cc LR UOP: 600 cc Drains: Foley catheter  Specimen(s):    ID Type Source Tests Collected by Time Destination  1 : uerus with cervix Tissue [facility] benign resection SURGICAL PATHOLOGY Ruffin Frederick, MD 05/29/2015 1101     Findings:  ASA 2. Wound class 2 Uterus: Larger than anticipated by TVUS. Uterus 18 cm rather than the 16 cm as previously palpated in the office. This was more easily palpable under anesthesia. What had been described as 13 cm uterus with pedunculated fibroid wasn't pedunculated at all, but was rather a large subserosal fibroid. The decision was made to convert to open surgery which was anticipated and discussed at length with family. Ovaries and Tubes: Normal, clips in place Adhesions: none   Complications:  none   Disposition:  Extubated to PACU.   Technique:   After a satisfactory level of general anesthesia was achieved and the patient in supine position, SCD stockings were placed and turned on.  Preoperative antibiotics had been given.  A time out was performed.  Her abdomen was prepped with Alcohol and the vulva and vagina were widely prepped with Hibaclens.  A foley catheter was placed into the bladder.  The abdomen was draped out with sterile drapes.  Uterus sounded to 18 cm. Medium V-care was placed.  Laparoscopy was started. The umbilicus was infiltrated with 5 cc of dilute marcaine. 11 mm incision was made. The 10 mm  operative scope was placed under direct visualization using an optical trocar and sleeve. Once intraperitoneal access was obtained, the abdomen was insufflated. A prohibitively enlarged uterus was noted as described above, and the decision was made to convert to open incision. The abdomen was deflated, the fascia was closed with 2-0 Vicryl, and skin closed with 4-0 Monocryl. V-care was removed.   The abdomen was entered through a Pfannenstiel incision and hemostasis achieved with Bovie electrosurgery.  The parietal peritoneum was elevated and entered sharply and incised in a vertical fashion.  The abdomen and pelvis were inspected with findings as noted above.   All suture ligatures were 0-Vicryl unless otherwise specified, with larger pedicles transfixed, and Heaney clamps were used.    Large Kelly clamps were placed along the lateral walls of the uterus and used for traction and to control back-bleeding.  The round ligaments were sutured and divided in their mid section, opening up the broad ligament.  The adnexal pedicles were bluntly isolated and clamped, cut and doubly ligated with a suture ligature doubly. The ureter was identified inferior and posterior to the ligation site.  The anterior leaf of the broad ligament was now entered and incised inferiorly and the bladder flap developed with sharp and blunt dissection. The uterine vessels were skeletonized and the uterine arteries clamped with and divided, high on the uterus due to it's large size. Care was taken to avoid back bleeding. The uterus was followed down and successive clamp, cut, tie sequences were used to  reach the uterine artery at the cardinal ligament. The bulky uterus was then amputated using scalpel.  Straight Heaney clamps were now used to divide the cervix from the cardinal ligaments, and approximately 5 clamps were required on each side.  The ureters were identified by palpation and were lateral and inferior to the operative site.     Once the cervicovaginal junction was reached, curved Heaney clamps were placed across the vaginal angles and the uterus and cervix transected from the cuff with the [employer].   The angles were sutured to affix the cuff to the lower cardinal and uterosacral ligaments and held.  The remainder of the cuff was closed with 2 interrupted figure of 8 sutures.  The pelvis was irrigated thoroughly and hemostasis assured.    The subfascial and subcutaneous tissues were extensively irrigated and hemostasis assured.  Hemostasis was achieved with 2 additional figure of 8 sutures at the left pedicle. 3 g of Arista was used on the raw peritoneal edge bilaterally as a prophylactic measure.The rectus fascia was closed with 0-Vicryl.  The subcutaneous tissue was re-approximated with several interrupted sutures of 0- Vicryl suture and the skin was closed with 4-0 Monocryl suture subcuticularly.  Sterile dressing applied.    Final sponge, needle, and instrument counts were correct times three.  Ending time  was performed.  The patient was now extubated without difficulty and taken to the PACU in stable condition.

## 2015-05-29 NOTE — Anesthesia Procedure Notes (Signed)
Procedure Name: Intubation Performed by: Kennon Holter Pre-anesthesia Checklist: Patient identified, Timeout performed, Emergency Drugs available, Suction available and Patient being monitored Patient Re-evaluated:Patient Re-evaluated prior to inductionOxygen Delivery Method: Circle system utilized Preoxygenation: Pre-oxygenation with 100% oxygen Intubation Type: IV induction Ventilation: Mask ventilation without difficulty Laryngoscope Size: 3 Grade View: Grade I Tube type: Oral Number of attempts: 1 Airway Equipment and Method: Stylet

## 2015-05-29 NOTE — Anesthesia Preprocedure Evaluation (Addendum)
Anesthesia Evaluation  Patient identified by MRN, date of birth, ID band Patient awake    Reviewed: Allergy & Precautions, NPO status , Patient's Chart, lab work & pertinent test results  History of Anesthesia Complications Negative for: history of anesthetic complications  Airway Mallampati: II  TM Distance: >3 FB Neck ROM: Full    Dental  (+) Implants, Dental Advisory Given,    Pulmonary former smoker (quit x 3 yrs),          Cardiovascular     Neuro/Psych  Headaches (occ.),    GI/Hepatic   Endo/Other    Renal/GU      Musculoskeletal   Abdominal   Peds  Hematology  (+) anemia ,   Anesthesia Other Findings   Reproductive/Obstetrics                            Anesthesia Physical Anesthesia Plan  ASA: II  Anesthesia Plan: General   Post-op Pain Management:    Induction: Intravenous  Airway Management Planned: Oral ETT  Additional Equipment:   Intra-op Plan:   Post-operative Plan:   Informed Consent: I have reviewed the patients History and Physical, chart, labs and discussed the procedure including the risks, benefits and alternatives for the proposed anesthesia with the patient or authorized representative who has indicated his/her understanding and acceptance.     Plan Discussed with:   Anesthesia Plan Comments:         Anesthesia Quick Evaluation

## 2015-05-29 NOTE — Progress Notes (Signed)
Daily Progress Note  Service:  [facility] Day:   LOS: 0 days  Attending:  Ruffin Frederick, MD    Subjective:                                                                                          The patient feels tired.  Pain is well controlled with current medications. Nausea/vomiting:  no nausea, no vomiting. Urinary output is adequate via Foley. Flatus: None. Ambulating:None The patient is not tolerating a normal diet.   ROS:  Denies HA, CP, SOB, FC, NV, dizziness, lightheadedness, leg tenderness.     Objective:    24hour vital signs:  Temp:  [97.5 F (36.4 C)-98.6 F (37 C)] 98.2 F (36.8 C) (06/03 1700) Pulse Rate:  [60-137] 62 (06/03 1700) Resp:  [8-24] 18 (06/03 1700) BP: (122-138)/(61-80) 132/62 mmHg (06/03 1700) SpO2:  [96 %-100 %] 100 % (06/03 1700)  SpO2 Readings from Last 1 Encounters:  05/29/15 100%        General:    alert and no distress  HEENT:   NCAT, MMM, OP clear, EOMI  Cardiovascular:   RRR, no murmurs  Pulmonary:   CTAB, no wheezing, no crackles, resps unlabored  Abdomen:  absent, no TTP, no distension  Incision:  healing well, no significant drainage, no dehiscence, no significant erythema  Extremities:  no edema, No evidence of DVT seen on physical exam.    Labs: Recent Labs     05/29/15  0736  05/29/15  1348  WBC   --   13.0*  HGB  10.0*  10.1*  HCT  32.4*  33.0*  MCV   --   77.5*  PLT   --   216   Recent Labs     05/29/15  1348  NA  139  K  3.4*  CL  103  CO2  27  BUN  10  CREATININE  0.66  CALCIUM  8.8*   No results for input(s): BILITOT, BILIDIR, ALKPHOS, AST, ALT, PROT in the last 72 hours.  Invalid input(s): LABALB  Medications: Scheduled Meds: . ketorolac  30 mg Intravenous 4 times per day   Or  . ketorolac  30 mg Intramuscular 4 times per day  . morphine   Intravenous 6 times per day  . simethicone  80 mg Oral TID PC & HS    Continuous Infusions: . dextrose 5 % and 0.45 % NaCl with KCl 20  mEq/L 125 mL/hr at 05/29/15 1504    PRN Meds:diphenhydrAMINE **OR** diphenhydrAMINE, naloxone **AND** sodium chloride, ondansetron (ZOFRAN) IV  .Assessment:   44 y.o. POD #0 s/p TAH for large fibroid uterus, EBL 575 cc. Doing well postoperatively.  Plan:  1. Post-op: - Pain: well controlled on current regimen of Toradol and Morphine PCA - CVS:  No Issues - Pulm:  IS bedside - GI: Clears, ADAT. No N/V. Awaiting flatus.  Zofran PRN.  Add Colace BID - GU: Foley in place. Adeq UOP. D/c Foley in am.   - Heme:  preop Hgb 10.0;  Postop Hgb in the AM, given 250 cc blood back via  cell saver. Acute blood loss anemia on top of chronic anemia  - ID:  Pt received antibiotics at the appropriate intervals during her surgery. No current signs or symptoms of infection. - FEN:  MIVF 125 cc/hr - Endocrine:  No issues - DVT ppx: SCD's,  ambulate TID - Wound: Healing appropriately after surgery. - Path: Discussed operative findings with patient and her family. All questions answered.   3. Dispo:  - Anticipate discharge home on POD #2-3

## 2015-05-30 DIAGNOSIS — D6489 Other specified anemias: Secondary | ICD-10-CM | POA: Diagnosis not present

## 2015-05-30 MED ORDER — HYDROCODONE-ACETAMINOPHEN 5-325 MG PO TABS
1.0000 | ORAL_TABLET | ORAL | Status: DC | PRN
  Administered 2015-05-30 (×3): 1 via ORAL
  Administered 2015-05-30: 2 via ORAL
  Filled 2015-05-30: qty 2
  Filled 2015-05-30 (×2): qty 1
  Filled 2015-05-30: qty 2
  Filled 2015-05-30: qty 1

## 2015-05-30 MED ORDER — IBUPROFEN 800 MG PO TABS
800.0000 mg | ORAL_TABLET | Freq: Three times a day (TID) | ORAL | Status: DC
  Administered 2015-05-30 – 2015-05-31 (×2): 800 mg via ORAL
  Filled 2015-05-30 (×3): qty 1

## 2015-05-30 MED ORDER — MORPHINE SULFATE 2 MG/ML IJ SOLN
2.0000 mg | INTRAMUSCULAR | Status: DC | PRN
Start: 2015-05-30 — End: 2015-05-31

## 2015-05-30 NOTE — Progress Notes (Signed)
Daily Progress Note  Service:  [facility] Day:   LOS: 1 day  Attending:  Ruffin Frederick, MD    Subjective:                                                                                          The patient feels great.  Pain is well controlled with current medications. Nausea/vomiting:  no nausea, no vomiting. Urinary output is adequate via Foley. Flatus: None, yet Ambulating:In the room The patient is tolerating a normal diet.   ROS:  Denies HA, CP, SOB, FC, NV, dizziness, lightheadedness, leg tenderness.     Objective:    24hour vital signs:  Temp:  [97.5 F (36.4 C)-98.6 F (37 C)] 98.6 F (37 C) (06/04 0328) Pulse Rate:  [60-137] 79 (06/04 0328) Resp:  [8-24] 19 (06/04 0750) BP: (109-138)/(57-80) 125/68 mmHg (06/04 0328) SpO2:  [96 %-100 %] 100 % (06/04 0328)  SpO2 Readings from Last 1 Encounters:  05/30/15 100%    I/O last 3 completed shifts: In: 3664 [I.V.:2960; Blood:250] Out: 4034 [Urine:2150; Blood:1075]   General:    alert and no distress  HEENT:   NCAT, MMM, OP clear, EOMI  Cardiovascular:   RRR, no murmurs  Pulmonary:   CTAB, no wheezing, no crackles, resps unlabored  Abdomen:  present, no TTP, no distension  Incision:  healing well, no significant drainage, no dehiscence, no significant erythema  Extremities:  no edema, No evidence of DVT seen on physical exam.    Labs: Recent Labs     05/29/15  0736  05/29/15  1348  WBC   --   13.0*  HGB  10.0*  10.1*  HCT  32.4*  33.0*  MCV   --   77.5*  PLT   --   216   Recent Labs     05/29/15  1348  NA  139  K  3.4*  CL  103  CO2  27  BUN  10  CREATININE  0.66  CALCIUM  8.8*   No results for input(s): BILITOT, BILIDIR, ALKPHOS, AST, ALT, PROT in the last 72 hours.  Invalid input(s): LABALB  Medications: Scheduled Meds: . ibuprofen  800 mg Oral TID  . ketorolac  30 mg Intravenous 4 times per day   Or  . ketorolac  30 mg Intramuscular 4 times per day  . simethicone  80 mg  Oral TID PC & HS    Continuous Infusions: . dextrose 5 % and 0.45 % NaCl with KCl 20 mEq/L 125 mL/hr at 05/30/15 0337    PRN Meds:HYDROcodone-acetaminophen, morphine injection  .Assessment:   44 y.o. POD #1 s/p TAH for large fibroid uterus, EBL 575 cc. Doing well postoperatively.  Plan:  1. Post-op: - Pain: well controlled on current regimen of Toradol and Morphine PCA, plan to change to Motrin, Norco with Morphine for breakthrough - CVS:  No Issues - Pulm:  IS bedside - GI: regular diet. No N/V. Awaiting flatus.  Zofran PRN.  Added Colace BID - GU: Needed, 500 cc bolus overnight, otherwise adequate UOP. D/C foley today, voiding trial   -  Heme:  preop Hgb 10.0;  Postop Hgb 10.1 given 250 cc blood back via cell saver. Acute blood loss anemia on top of chronic anemia  - ID:  Pt received antibiotics at the appropriate intervals during her surgery. No current signs or symptoms of infection. - FEN:  MIVF decrease to 75 cc/hr - Endocrine:  No issues - DVT ppx: SCD's,  ambulate TID - Wound: Healing appropriately after surgery. - Path: Discussed operative findings with patient and her family. All questions answered.   3. Dispo:  - Anticipate discharge home on POD #2

## 2015-05-30 NOTE — Progress Notes (Signed)
Bladder scan post void with output of 200 ml and zero residual

## 2015-05-31 DIAGNOSIS — D6489 Other specified anemias: Secondary | ICD-10-CM | POA: Diagnosis not present

## 2015-05-31 DIAGNOSIS — D649 Anemia, unspecified: Secondary | ICD-10-CM | POA: Diagnosis present

## 2015-05-31 MED ORDER — OXYCODONE-ACETAMINOPHEN 5-325 MG PO TABS: 1.0000 | ORAL_TABLET | ORAL | Status: DC | PRN

## 2015-05-31 MED ORDER — DOCUSATE SODIUM 100 MG PO CAPS: 100.0000 mg | ORAL_CAPSULE | Freq: Two times a day (BID) | ORAL | Status: DC

## 2015-05-31 MED ORDER — ALUM & MAG HYDROXIDE-SIMETH 200-200-20 MG/5ML PO SUSP: 30.0000 mL | ORAL | Status: DC | PRN

## 2015-05-31 MED ORDER — ONDANSETRON HCL 4 MG/2ML IJ SOLN: 4.0000 mg | Freq: Four times a day (QID) | INTRAMUSCULAR | Status: DC | PRN

## 2015-05-31 MED ORDER — DOCUSATE SODIUM 100 MG PO CAPS
100.0000 mg | ORAL_CAPSULE | Freq: Two times a day (BID) | ORAL | Status: DC
  Administered 2015-05-31: 100 mg via ORAL
  Filled 2015-05-31: qty 1

## 2015-05-31 MED ORDER — HYDROMORPHONE HCL 1 MG/ML IJ SOLN: 0.2000 mg | INTRAMUSCULAR | Status: DC | PRN

## 2015-05-31 MED ORDER — OXYCODONE-ACETAMINOPHEN 5-325 MG PO TABS
1.0000 | ORAL_TABLET | ORAL | Status: DC | PRN
  Administered 2015-05-31: 1 via ORAL
  Filled 2015-05-31 (×2): qty 2

## 2015-05-31 MED ORDER — IBUPROFEN 800 MG PO TABS: 800.0000 mg | ORAL_TABLET | Freq: Three times a day (TID) | ORAL | Status: DC

## 2015-05-31 MED ORDER — ACETAMINOPHEN 325 MG PO TABS: 650.0000 mg | ORAL_TABLET | ORAL | Status: DC | PRN

## 2015-05-31 MED ORDER — PRENATAL MULTIVITAMIN CH: 1.0000 | ORAL_TABLET | Freq: Every day | ORAL | Status: DC

## 2015-05-31 MED ORDER — ONDANSETRON HCL 4 MG PO TABS: 4.0000 mg | ORAL_TABLET | Freq: Four times a day (QID) | ORAL | Status: DC | PRN

## 2015-05-31 NOTE — Discharge Summary (Signed)
Physician Discharge Summary  Patient ID: Amanda Moran MRN: [id number] DOB/AGE: 1971/12/01 44 y.o.  Admit date: 05/29/2015 Discharge date: 05/31/2015  Admission Diagnoses: Uterine Fibroids leading to AUB-L, chronic anemia  Discharge Diagnoses:  Active Problems:   Chronic anemia   Discharged Condition: good  Hospital Course: Ms. Ulloa was brought in the day before surgery and received 2 U PRBC for a preop Hgb of 5. It had temporarily increased on 8 in the interim between scheduling and surgery. The patient was admitted on the day of scheduled surgery and proceeded to the operating room as scheduled for the below stated procedure without complications, EBL 768GS with 250 given back.  (For complete operative information, please see operative report).  Postoperatively she was admitted to the floor. Pain was initially managed with Morphine PCA which was transitioned to PO Motrin and Percocet when the patient was tolerating a regular diet on postoperative day number 1.  Postoperative labs were stable (Hgb 10 --> 10 and creatinine 0.66 )  Foley cath was discontinued on postoperative day number 1 and patient passed a voiding trial without difficulty.   Patient was felt to be stable for discharge on postoperative day number 2 when she was tolerating a regular diet, pain was controlled with po pain medications, and she was ambulating and voiding without difficulty. Vital signs were stable and physical exam remained benign throughout her hospital stay. Incision was clean,dry, and intact with sutures.  She will follow up per below for post-op check in two weeks.  Rx given for Percocet, Motrin, and Colace.    She was given specific instructions and numbers to call in written and verbal format. She verbalized understanding, agrees with the plan of care, and all questions answered to her satisfaction.   Consults: None  Significant Diagnostic Studies: See summary  Treatments: IV hydration, analgesia:  Morphine and Motrin, Toradol, Percocet and surgery: TAH, BS  Discharge Exam: Blood pressure 114/70, pulse 68, temperature 98.4 F (36.9 C), temperature source Oral, resp. rate 18, SpO2 100 %. General:  alert and no distress  HEENT:  NCAT, MMM, OP clear, EOMI  Cardiovascular:  RRR, no murmurs  Pulmonary:  CTAB, no wheezing, no crackles, resps unlabored  Abdomen: present, no TTP, no distension  Incision: healing well, no significant drainage, no dehiscence, no significant erythema  Extremities: no edema, No evidence of DVT seen on physical exam.         Disposition: 01-Home or Self Care      Discharge Instructions    Discharge patient    Complete by:  As directed             Medication List    STOP taking these medications        oxyCODONE 5 MG immediate release tablet  Commonly known as:  ROXICODONE      TAKE these medications        docusate sodium 100 MG capsule  Commonly known as:  COLACE  Take 1 capsule (100 mg total) by mouth 2 (two) times daily.     ferrous sulfate 325 (65 FE) MG tablet  Take 325 mg by mouth 3 (three) times daily with meals.     ibuprofen 200 MG tablet  Commonly known as:  ADVIL,MOTRIN  Take 600 mg by mouth every 6 (six) hours as needed for headache or mild pain.     ibuprofen 800 MG tablet  Commonly known as:  ADVIL,MOTRIN  Take 1 tablet (800 mg total) by mouth 3 (three)  times daily.     oxyCODONE-acetaminophen 5-325 MG per tablet  Commonly known as:  PERCOCET/ROXICET  Take 1-2 tablets by mouth every 3 (three) hours as needed (moderate to severe pain (when tolerating fluids)).       Follow-up Information    Follow up with GOODMAN, Clayburn Pert, MD In 2 weeks.   Specialty:  Obstetrics and Gynecology   Contact information:   [place] [address] [phone]       Signed: Ruffin Frederick 05/31/2015, 7:35 AM

## 2015-05-31 NOTE — Discharge Instructions (Signed)
Signs and Symptoms to Report Call our office at [phone] if you have any of the following.   Fever over 100.4 degrees or higher  Severe stomach pain not relieved with pain medications  Bright red bleeding thats heavier than a period that does not slow with rest  To go the bathroom a lot (frequency), you cant hold your urine (urgency), or it hurts when you empty your bladder (urinate)  Chest pain  Shortness of breath  Pain in the calves of your legs  Severe nausea and vomiting not relieved with anti-nausea medications  Signs of infection around your wounds, such as redness, hot to touch, swelling, green/yellow drainage (like pus), bad smelling discharge  Any concerns  What You Can Expect after Surgery  You may see some pink tinged, bloody fluid and bruising around the wound. This is normal.  You may notice shoulder and neck pain. This is caused by the gas used during surgery to expand your abdomen so your surgeon could get to the uterus easier.  You may have a sore throat because of the tube in your mouth during general anesthesia. This will go away in 2 to 3 days.  You may have some stomach cramps.  You may notice spotting on your panties.  You may have pain around the incision sites.   Activities after Your Discharge Follow these guidelines to help speed your recovery at home:  Do the coughing and deep breathing as you did in the hospital for 2 weeks. Use the small blue breathing device, called the incentive spirometer for 2 weeks.  Dont drive if you are in pain or taking narcotic pain medicine. You may drive when you can safely slam on the brakes, turn the wheel forcefully, and rotate your torso comfortably. This is typically 1-2 weeks. Practice in a parking lot or side street prior to attempting to drive regularly.   Ask others to help with household chores for 4 weeks.  Do not lift anything heavier that 10 pounds for 4-6 weeks. This includes pets,  children, and groceries.  Dont do strenuous activities, exercises, or sports like vacuuming, tennis, squash, etc. until your doctor says it is safe to do so. ---If you had a hysterectomy (abdominal, laparoscopic, or vaginal) do not have intercourse for 8-10 weeks.   Walk as you feel able. Rest often since it may take two or three weeks for your energy level to return to normal.   You may climb stairs  Avoid constipation:   -Eat fruits, vegetables, and whole grains. Eat small meals as your appetite will take time to return to normal.   -Drink 6 to 8 glasses of water each day unless your doctor has told you to limit your fluids.   -Use a laxative or stool softener as needed if constipation becomes a problem. You may take Miralax, metamucil, Citrucil, Colace, Senekot, FiberCon, etc. If this does not relieve the constipation, try two tablespoons of Milk Of Magnesia every 8 hours until your bowels move.   You may shower. Gently wash the wounds with a mild soap and water. Pat dry.  Do not get in a hot tub, swimming pool, etc. until your doctor agrees.  Do not use lotions, oils, powders on the wounds.  Do not douche, use tampons, or have sex until your doctor says it is okay.  Take your pain medicine when you need it. The medicine may not work as well if the pain is bad.  Take the medicines you were  taking before surgery. Other medications you will need are pain medications (Norco or Percocet) and nausea medications (Zofran).   Call your doctor for increased pain or vaginal bleeding, temperature above 100.4, depression, or concerns.  No strenuous activity or heavy lifting for 6 weeks.  No intercourse, tampons, douching, or enemas for 6 weeks.  No tub baths-showers only.  No driving for 2 weeks or while taking percocet.  Keep incision clean and dry.  Call your doctor for incision concerns including redness, swelling, bleeding or drainage, or if begins to come apart.

## 2015-06-01 LAB — SURGICAL PATHOLOGY

## 2015-06-23 LAB — POCT PREGNANCY, URINE: PREG TEST UR: NEGATIVE

## 2015-07-31 ENCOUNTER — Ambulatory Visit: Payer: Self-pay | Admitting: Family Medicine

## 2015-08-06 ENCOUNTER — Other Ambulatory Visit: Payer: Self-pay | Admitting: Family Medicine

## 2015-08-06 ENCOUNTER — Ambulatory Visit (INDEPENDENT_AMBULATORY_CARE_PROVIDER_SITE_OTHER): Payer: Commercial Managed Care - PPO | Admitting: Family Medicine

## 2015-08-06 ENCOUNTER — Encounter: Payer: Self-pay | Admitting: Family Medicine

## 2015-08-06 DIAGNOSIS — E119 Type 2 diabetes mellitus without complications: Secondary | ICD-10-CM | POA: Diagnosis not present

## 2015-08-06 DIAGNOSIS — R748 Abnormal levels of other serum enzymes: Secondary | ICD-10-CM

## 2015-08-06 DIAGNOSIS — R7301 Impaired fasting glucose: Secondary | ICD-10-CM | POA: Insufficient documentation

## 2015-08-06 DIAGNOSIS — Z1231 Encounter for screening mammogram for malignant neoplasm of breast: Secondary | ICD-10-CM

## 2015-08-06 DIAGNOSIS — D500 Iron deficiency anemia secondary to blood loss (chronic): Secondary | ICD-10-CM | POA: Insufficient documentation

## 2015-08-06 LAB — CBC WITH DIFFERENTIAL/PLATELET
Hematocrit: 36.4 % (ref 34.0–46.6)
Hemoglobin: 11.5 g/dL (ref 11.1–15.9)
Lymphocytes Absolute: 1.9 10*3/uL (ref 0.7–3.1)
Lymphs: 29 %
MCH: 26.4 pg — ABNORMAL LOW (ref 26.6–33.0)
MCHC: 31.6 g/dL (ref 31.5–35.7)
MCV: 84 fL (ref 79–97)
MID (Absolute): 0.4 10*3/uL (ref 0.1–1.6)
MID: 7 %
NEUTROS ABS: 4.4 10*3/uL (ref 1.4–7.0)
Neutrophils: 65 %
PLATELETS: 237 10*3/uL (ref 150–379)
RBC: 4.35 x10E6/uL (ref 3.77–5.28)
RDW: 20.5 % — ABNORMAL HIGH (ref 12.3–15.4)
WBC: 6.7 10*3/uL (ref 3.4–10.8)

## 2015-08-06 LAB — MICROALBUMIN, URINE WAIVED
CREATININE, URINE WAIVED: 300 mg/dL (ref 10–300)
MICROALB, UR WAIVED: 30 mg/L — AB (ref 0–19)
Microalb/Creat Ratio: <30 mg/g (ref <30)

## 2015-08-06 LAB — BAYER DCA HB A1C WAIVED: HB A1C (BAYER DCA - WAIVED): 5.4 % (ref <7.0)

## 2015-08-06 NOTE — Assessment & Plan Note (Addendum)
A1c 6.5 at last check in May. On recheck today, it came back at 5.4! Keep watching what she's eating and working on diet and exercise. Will check back in with A1c in 6 months.

## 2015-08-06 NOTE — Patient Instructions (Signed)
Diabetes and Standards of Medical Care Diabetes is complicated. You may find that your diabetes team includes a dietitian, nurse, diabetes educator, eye doctor, and more. To help everyone know what is going on and to help you get the care you deserve, the following schedule of care was developed to help keep you on track. Below are the tests, exams, vaccines, medicines, education, and plans you will need. HbA1c test This test shows how well you have controlled your glucose over the past 2-3 months. It is used to see if your diabetes management plan needs to be adjusted.   It is performed at least 2 times a year if you are meeting treatment goals.  It is performed 4 times a year if therapy has changed or if you are not meeting treatment goals. Blood pressure test  This test is performed at every routine medical visit. The goal is less than 140/90 mm Hg for most people, but 130/80 mm Hg in some cases. Ask your health care provider about your goal. Dental exam  Follow up with the dentist regularly. Eye exam  If you are diagnosed with type 1 diabetes as a child, get an exam upon reaching the age of 37 years or older and have had diabetes for 3-5 years. Yearly eye exams are recommended after that initial eye exam.  If you are diagnosed with type 1 diabetes as an adult, get an exam within 5 years of diagnosis and then yearly.  If you are diagnosed with type 2 diabetes, get an exam as soon as possible after the diagnosis and then yearly. Foot care exam  Visual foot exams are performed at every routine medical visit. The exams check for cuts, injuries, or other problems with the feet.  A comprehensive foot exam should be done yearly. This includes visual inspection as well as assessing foot pulses and testing for loss of sensation.  Check your feet nightly for cuts, injuries, or other problems with your feet. Tell your health care provider if anything is not healing. Kidney function test (urine  microalbumin)  This test is performed once a year.  Type 1 diabetes: The first test is performed 5 years after diagnosis.  Type 2 diabetes: The first test is performed at the time of diagnosis.  A serum creatinine and estimated glomerular filtration rate (eGFR) test is done once a year to assess the level of chronic kidney disease (CKD), if present. Lipid profile (cholesterol, HDL, LDL, triglycerides)  Performed every 5 years for most people.  The goal for LDL is less than 100 mg/dL. If you are at high risk, the goal is less than 70 mg/dL.  The goal for HDL is 40 mg/dL-50 mg/dL for men and 50 mg/dL-60 mg/dL for women. An HDL cholesterol of 60 mg/dL or higher gives some protection against heart disease.  The goal for triglycerides is less than 150 mg/dL. Influenza vaccine, pneumococcal vaccine, and hepatitis B vaccine  The influenza vaccine is recommended yearly.  It is recommended that people with diabetes who are over 24 years old get the pneumonia vaccine. In some cases, two separate shots may be given. Ask your health care provider if your pneumonia vaccination is up to date.  The hepatitis B vaccine is also recommended for adults with diabetes. Diabetes self-management education  Education is recommended at diagnosis and ongoing as needed. Treatment plan  Your treatment plan is reviewed at every medical visit. Document Released: 10/09/2009 Document Revised: 04/28/2014 Document Reviewed: 05/14/2013 [facility] Patient Information 2015 [place],  [employer]. This information is not intended to replace advice given to you by your health care provider. Make sure you discuss any questions you have with your health care provider.  

## 2015-08-06 NOTE — Assessment & Plan Note (Addendum)
Rechecking CBC today following hysterectomy 3 months ago. CBC came back at low normal. Recommended patient restart iron supplementation to bring it back where it needs to be. Will recheck in 6 months.

## 2015-08-06 NOTE — Progress Notes (Signed)
BP 108/63 mmHg  Pulse 91  Temp(Src) 98.4 F (36.9 C)  Ht 5' 4.6" (1.641 m)  Wt 231 lb 3.2 oz (104.872 kg)  BMI 38.94 kg/m2  SpO2 100%  LMP  (LMP Unknown)   Subjective:    Patient ID: Amanda Moran, female    DOB: 02/07/1971, 44 y.o.   MRN: [id number]  HPI: Amanda Moran is a 44 y.o. female  Chief Complaint  Patient presents with  . Diabetes  . Anemia   DIABETES Hypoglycemic episodes:no Polydipsia/polyuria: no Visual disturbance: no Chest pain: no Paresthesias: no Glucose Monitoring: no Taking Insulin?: no Blood Pressure Monitoring: not checking Retinal Examination: Not up to Date Foot Exam: Up to Date Diabetic Education: Not Completed Pneumovax: Refused Influenza: Not up to Date Aspirin: no  ANEMIA- had a hysterectomy in May, has been feeling much better since then.  Anemia status: stable Etiology of anemia: chronic blood loss Duration of anemia treatment: hysterectomy in May Compliance with treatment: fair compliance Severity of anemia: severe Fatigue: no Decreased exercise tolerance: no  Dyspnea on exertion: no Palpitations: no Bleeding: no Pica: no   Relevant past medical, surgical, family and social history reviewed and updated as indicated. Interim medical history since our last visit reviewed. Allergies and medications reviewed and updated.  Review of Systems  Constitutional: Negative.   Respiratory: Negative.   Cardiovascular: Negative.   Gastrointestinal: Negative.   Psychiatric/Behavioral: Negative.    Per HPI unless specifically indicated above     Objective:    BP 108/63 mmHg  Pulse 91  Temp(Src) 98.4 F (36.9 C)  Ht 5' 4.6" (1.641 m)  Wt 231 lb 3.2 oz (104.872 kg)  BMI 38.94 kg/m2  SpO2 100%  LMP  (LMP Unknown)  Wt Readings from Last 3 Encounters:  08/06/15 231 lb 3.2 oz (104.872 kg)  04/23/15 227 lb (102.967 kg)  05/28/15 220 lb (99.791 kg)    Physical Exam  Constitutional: She is oriented to person, place, and time. She  appears well-developed and well-nourished. No distress.  HENT:  Head: Normocephalic and atraumatic.  Right Ear: Hearing normal.  Left Ear: Hearing normal.  Nose: Nose normal.  Eyes: Conjunctivae and lids are normal. Right eye exhibits no discharge. Left eye exhibits no discharge. No scleral icterus.  Cardiovascular: Normal rate and regular rhythm.  Exam reveals no gallop and no friction rub.   Murmur heard.  Crescendo systolic murmur is present with a grade of 4/6  Pulmonary/Chest: Effort normal and breath sounds normal. No respiratory distress. She has no wheezes. She has no rales. She exhibits no tenderness.  Musculoskeletal: Normal range of motion.  Neurological: She is alert and oriented to person, place, and time.  Skin: Skin is warm, dry and intact. No rash noted. No erythema. No pallor.  Psychiatric: She has a normal mood and affect. Her speech is normal and behavior is normal. Judgment and thought content normal. Cognition and memory are normal.  Nursing note and vitals reviewed.   Results for orders placed or performed during the hospital encounter of 05/29/15  Hemoglobin and hematocrit, blood  Result Value Ref Range   Hemoglobin 10.0 (L) 12.0 - 16.0 g/dL   HCT 32.4 (L) 35.0 - 47.0 %  CBC  Result Value Ref Range   WBC 13.0 (H) 3.6 - 11.0 K/uL   RBC 4.26 3.80 - 5.20 MIL/uL   Hemoglobin 10.1 (L) 12.0 - 16.0 g/dL   HCT 33.0 (L) 35.0 - 47.0 %   MCV 77.5 (L) 80.0 -  100.0 fL   MCH 23.6 (L) 26.0 - 34.0 pg   MCHC 30.5 (L) 32.0 - 36.0 g/dL   RDW 27.0 (H) 11.5 - 14.5 %   Platelets 216 150 - 440 K/uL  Basic metabolic panel  Result Value Ref Range   Sodium 139 135 - 145 mmol/L   Potassium 3.4 (L) 3.5 - 5.1 mmol/L   Chloride 103 101 - 111 mmol/L   CO2 27 22 - 32 mmol/L   Glucose, Bld 165 (H) 65 - 99 mg/dL   BUN 10 6 - 20 mg/dL   Creatinine, Ser 0.66 0.44 - 1.00 mg/dL   Calcium 8.8 (L) 8.9 - 10.3 mg/dL   GFR calc non Af Amer >60 >60 mL/min   GFR calc Af Amer >60 >60 mL/min    Anion gap 9 5 - 15  Pregnancy, urine POC  Result Value Ref Range   Preg Test, Ur NEGATIVE NEGATIVE  Type and screen for Red Blood Exchange  Result Value Ref Range   ABO/RH(D) O POS    Antibody Screen NEG    Sample Expiration 06/01/2015   ABO/Rh  Result Value Ref Range   ABO/RH(D) O POS   Surgical pathology  Result Value Ref Range   SURGICAL PATHOLOGY      Surgical Pathology CASE: [id number] PATIENT: Amanda Moran Surgical Pathology Report     SPECIMEN SUBMITTED: A. Uterus with cervix, bilateral tubes  CLINICAL HISTORY: None provided  PRE-OPERATIVE DIAGNOSIS: Uterine fibroids  POST-OPERATIVE DIAGNOSIS: Same as pre-op     DIAGNOSIS: A. UTERUS WITH CERVIX, BILATERAL FALLOPIAN TUBES; ABDOMINAL TOTAL HYSTERECTOMY WITH BILATERAL SALPINGECTOMY: - CERVIX AND ENDOCERVIX WITH NO SIGNIFICANT PATHOLOGIC ABNORMALITY. - SUBSEROSAL AND INTRAMURAL LEIOMYOMATA, LARGEST MEASURING 9.2 CM WITH NEURILEMMOMMA-LIKE FEATURES. - PROLIFERATIVE ENDOMETRIUM. - ENDOMETRIAL POLYP, MEASURING 2 CM. - UNREMARKABLE FALLOPIAN TUBES. - NEGATIVE FOR MALIGNANCY.   GROSS DESCRIPTION: A. The specimen is received in a formalin-filled container labeled with the patient's name and uterus with cervix, bilateral tubes, which is markedly distorted and cannot be orientated.  Adnexa: Bilateral tubes received detached, ovaries not received We ight: 1741 grams Dimensions:      Fundus -15 x 14 x 11 cm      Cervix -7.3 x 4.5 x 3 cm, detached Serosa: Distorted and shows 2 subserosal nodules measuring 1.2 and 7.5 cm in diameter, each of which have a whorled pattern. Cervix: Pale-tan and soft Endocervix: Unremarkable Endometrial cavity:      Dimensions -9.5 x 4 x 2.5 cm      Thickness -0.1 cm      Other findings -noted on the endometrium is a pink red pedunculated polypoid structure measuring 2 x 1.2 x 0.3 cm in greatest dimensions, which appears to be confined to the endometrial  surface. Myometrium:     Thickness -up to 9.5 cm in thickness cm     Other findings -serial sectioning through the myometrium reveals numerous pale tan rubbery intramural nodules ranging from 0.8 cm in diameter up to 9.2 x 8.5 x 6 cm in greatest dimensions. The smaller masses have a pale tan to nodular whorled pattern while the largest mass is markedly discolored pink red soft and has a somewhat fleshy appearance. Adnexa: One fallo pian tube           Measurements -3 x 2.5 x 0.7           Other findings -portion of fallopian tube fimbria with no lumen grossly identified Other fallopian tube  Measurements -4 cm in length x 1 cm in diameter           Other findings -portion of congested fallopian tube with fimbria Other comments: None  Block summary: 1-2 cervix 3 endocervix 4-5 subserosal nodules 6-9 endomyometrium 10 endometrial polyp 11-12 smaller myometrial masses 13-15 sections of largest myometrial mass 16-17 entire first fallopian tube fimbria 18-19 entire longitudinally sectioned second fallopian tube fimbria and representative cross-sections   Final Diagnosis performed by Quay Burow, MD.  Electronically signed 06/01/2015 6:04:08PM    The electronic signature indicates that the named Attending Pathologist has evaluated the specimen  Technical component performed at [facility], [address] [place], [place] [address] Lab: [phone] Dir: Darrick Penna. Evette Doffing, MD  Profes sional component performed at [facility], [facility], [place], [place], [place] [address] Lab: [phone] Dir: Dellia Nims. Reuel Derby, MD        Assessment & Plan:   Problem List Items Addressed This Visit      Endocrine   Diet-controlled type 2 diabetes mellitus    A1c 6.5 at last check in May. On recheck today, it came back at 5.4! Keep watching what she's eating and working on diet and exercise. Will check back in with A1c in 6 months.         Other    Iron deficiency anemia due to chronic blood loss    Rechecking CBC today following hysterectomy 3 months ago. CBC came back at low normal. Recommended patient restart iron supplementation to bring it back where it needs to be. Will recheck in 6 months.       Relevant Orders   CBC With Differential/Platelet    Other Visit Diagnoses    Encounter for screening mammogram for breast cancer        Elevated alkaline phosphatase level        Will recheck levels today. Await results.     Type 2 diabetes, diet controlled        Relevant Orders    Bayer DCA Hb A1c Waived        Follow up plan: Return in about 6 months (around 02/06/2016) for A1c/Anemia follow up.

## 2015-08-07 ENCOUNTER — Encounter: Payer: Self-pay | Admitting: Family Medicine

## 2015-08-07 LAB — COMPREHENSIVE METABOLIC PANEL
A/G RATIO: 1.6 (ref 1.1–2.5)
ALT: 10 IU/L (ref 0–32)
AST: 12 IU/L (ref 0–40)
Albumin: 4.1 g/dL (ref 3.5–5.5)
Alkaline Phosphatase: 47 IU/L (ref 39–117)
BILIRUBIN TOTAL: 0.3 mg/dL (ref 0.0–1.2)
BUN/Creatinine Ratio: 13 (ref 9–23)
BUN: 7 mg/dL (ref 6–24)
CALCIUM: 8.6 mg/dL — AB (ref 8.7–10.2)
CO2: 24 mmol/L (ref 18–29)
Chloride: 104 mmol/L (ref 97–108)
Creatinine, Ser: 0.56 mg/dL — ABNORMAL LOW (ref 0.57–1.00)
GFR calc non Af Amer: 115 mL/min/{1.73_m2} (ref >59)
GFR, EST AFRICAN AMERICAN: 133 mL/min/{1.73_m2} (ref >59)
Globulin, Total: 2.5 g/dL (ref 1.5–4.5)
Glucose: 121 mg/dL — ABNORMAL HIGH (ref 65–99)
POTASSIUM: 3.6 mmol/L (ref 3.5–5.2)
Sodium: 143 mmol/L (ref 134–144)
Total Protein: 6.6 g/dL (ref 6.0–8.5)

## 2015-08-07 LAB — IRON AND TIBC
IRON: 73 ug/dL (ref 27–159)
Iron Saturation: 17 % (ref 15–55)
Total Iron Binding Capacity: 421 ug/dL (ref 250–450)
UIBC: 348 ug/dL (ref 131–425)

## 2015-08-07 LAB — FERRITIN: FERRITIN: 16 ng/mL (ref 15–150)

## 2015-08-21 ENCOUNTER — Encounter: Payer: Self-pay | Admitting: Family Medicine

## 2015-08-21 ENCOUNTER — Ambulatory Visit (INDEPENDENT_AMBULATORY_CARE_PROVIDER_SITE_OTHER): Payer: Commercial Managed Care - PPO | Admitting: Family Medicine

## 2015-08-21 VITALS — BP 116/78 | HR 74 | Temp 98.6°F | Ht 65.0 in | Wt 235.0 lb

## 2015-08-21 DIAGNOSIS — M5441 Lumbago with sciatica, right side: Secondary | ICD-10-CM | POA: Diagnosis not present

## 2015-08-21 MED ORDER — CYCLOBENZAPRINE HCL 10 MG PO TABS: 10.0000 mg | ORAL_TABLET | Freq: Three times a day (TID) | ORAL | Status: DC | PRN

## 2015-08-21 MED ORDER — IBUPROFEN 600 MG PO TABS: 600.0000 mg | ORAL_TABLET | Freq: Four times a day (QID) | ORAL | Status: DC | PRN

## 2015-08-21 NOTE — Progress Notes (Signed)
BP 116/78 mmHg  Pulse 74  Temp(Src) 98.6 F (37 C)  Ht 5\' 5"  (1.651 m)  Wt 235 lb (106.595 kg)  BMI 39.11 kg/m2  SpO2 99%  LMP  (LMP Unknown)   Subjective:    Patient ID: Amanda Moran, female    DOB: 18-Aug-1971, 44 y.o.   MRN: [id number]  HPI: Amanda Moran is a 44 y.o. female  Chief Complaint  Patient presents with  . Back Pain    right side low, throbbing, pinches when walking - Sunday bent down   BACK PAIN Duration: 1 weeks- bent down to change her shoes and she felt a pull in her back Mechanism of injury: bending down Location: Right and low back Onset: sudden Severity: severe Quality: sharp and throbbing Frequency: constant Radiation: buttocks Aggravating factors: movement, walking and bending Alleviating factors: rest, ice, heat, laying and NSAIDs Status: stable Treatments attempted: rest, ice, heat, APAP, ibuprofen and aleve  Relief with NSAIDs?: mild Nighttime pain:  yes Paresthesias / decreased sensation:  no Bowel / bladder incontinence:  no Fevers:  no Dysuria / urinary frequency:  no  Relevant past medical, surgical, family and social history reviewed and updated as indicated. Interim medical history since our last visit reviewed. Allergies and medications reviewed and updated.  Review of Systems  Constitutional: Negative.   Respiratory: Negative.   Cardiovascular: Negative.   Musculoskeletal: Positive for myalgias, back pain and gait problem. Negative for joint swelling, arthralgias, neck pain and neck stiffness.  Psychiatric/Behavioral: Negative.     Per HPI unless specifically indicated above     Objective:    BP 116/78 mmHg  Pulse 74  Temp(Src) 98.6 F (37 C)  Ht 5\' 5"  (1.651 m)  Wt 235 lb (106.595 kg)  BMI 39.11 kg/m2  SpO2 99%  LMP  (LMP Unknown)  Wt Readings from Last 3 Encounters:  08/21/15 235 lb (106.595 kg)  08/06/15 231 lb 3.2 oz (104.872 kg)  04/23/15 227 lb (102.967 kg)    Physical Exam  Constitutional: She is  oriented to person, place, and time. She appears well-developed and well-nourished. No distress.  HENT:  Head: Normocephalic and atraumatic.  Right Ear: Hearing normal.  Left Ear: Hearing normal.  Nose: Nose normal.  Eyes: Conjunctivae and lids are normal. Right eye exhibits no discharge. Left eye exhibits no discharge. No scleral icterus.  Pulmonary/Chest: Effort normal. No respiratory distress.  Neurological: She is alert and oriented to person, place, and time.  Skin: Skin is warm, dry and intact. No rash noted. No erythema. No pallor.  Psychiatric: She has a normal mood and affect. Her speech is normal and behavior is normal. Judgment and thought content normal. Cognition and memory are normal.  Nursing note and vitals reviewed.  Back Exam:    Inspection:  Normal spinal curvature.  No deformity, ecchymosis, erythema, or lesions     Palpation:     Midline spinal tenderness: no      Paralumbar tenderness: yesLeft     Parathoracic tenderness: no     Buttocks tenderness: yesRight     Range of Motion:      Flexion: Fingers to Knees     Extension:Decreased     Lateral bending:Decreased    Rotation:Decreased    Neuro Exam:Lower extremity DTRs normal & symmetric.  Strength and sensation intact.    Special Tests:      Straight leg raise:negative   Results for orders placed or performed in visit on 08/06/15  CBC With Differential/Platelet  Result Value Ref Range   WBC 6.7 3.4 - 10.8 x10E3/uL   RBC 4.35 3.77 - 5.28 x10E6/uL   Hemoglobin 11.5 11.1 - 15.9 g/dL   Hematocrit 36.4 34.0 - 46.6 %   MCV 84 79 - 97 fL   MCH 26.4 (L) 26.6 - 33.0 pg   MCHC 31.6 31.5 - 35.7 g/dL   RDW 20.5 (H) 12.3 - 15.4 %   Platelets 237 150 - 379 x10E3/uL   Neutrophils 65 %   Lymphs 29 %   MID 7 %   Neutrophils Absolute 4.4 1.4 - 7.0 x10E3/uL   Lymphocytes Absolute 1.9 0.7 - 3.1 x10E3/uL   MID (Absolute) 0.4 0.1 - 1.6 X10E3/uL  Comprehensive metabolic panel  Result Value Ref Range   Glucose 121 (H)  65 - 99 mg/dL   BUN 7 6 - 24 mg/dL   Creatinine, Ser 0.56 (L) 0.57 - 1.00 mg/dL   GFR calc non Af Amer 115 >59 mL/min/1.73   GFR calc Af Amer 133 >59 mL/min/1.73   BUN/Creatinine Ratio 13 9 - 23   Sodium 143 134 - 144 mmol/L   Potassium 3.6 3.5 - 5.2 mmol/L   Chloride 104 97 - 108 mmol/L   CO2 24 18 - 29 mmol/L   Calcium 8.6 (L) 8.7 - 10.2 mg/dL   Total Protein 6.6 6.0 - 8.5 g/dL   Albumin 4.1 3.5 - 5.5 g/dL   Globulin, Total 2.5 1.5 - 4.5 g/dL   Albumin/Globulin Ratio 1.6 1.1 - 2.5   Bilirubin Total 0.3 0.0 - 1.2 mg/dL   Alkaline Phosphatase 47 39 - 117 IU/L   AST 12 0 - 40 IU/L   ALT 10 0 - 32 IU/L  Iron and TIBC  Result Value Ref Range   Total Iron Binding Capacity 421 250 - 450 ug/dL   UIBC 348 131 - 425 ug/dL   Iron 73 27 - 159 ug/dL   Iron Saturation 17 15 - 55 %  Ferritin  Result Value Ref Range   Ferritin 16 15 - 150 ng/mL  Bayer DCA Hb A1c Waived  Result Value Ref Range   Bayer DCA Hb A1c Waived 5.4 <7.0 %  Microalbumin, Urine Waived  Result Value Ref Range   Microalb, Ur Waived 30 (H) 0 - 19 mg/L   Creatinine, Urine Waived 300 10 - 300 mg/dL   Microalb/Creat Ratio <30 <30 mg/g      Assessment & Plan:   Problem List Items Addressed This Visit    None    Visit Diagnoses    Right-sided low back pain with right-sided sciatica    -  Primary    Will start on felxeril and ibuprofen. Heat. Stretches given. If not better in 2 weeks, return and we will start PT.     Relevant Medications    cyclobenzaprine (FLEXERIL) 10 MG tablet    ibuprofen (ADVIL,MOTRIN) 600 MG tablet        Follow up plan: Return in about 2 weeks (around 09/04/2015), or if not better.

## 2015-08-21 NOTE — Patient Instructions (Signed)

## 2015-09-17 ENCOUNTER — Telehealth: Payer: Self-pay | Admitting: Family Medicine

## 2015-09-17 MED ORDER — IBUPROFEN 600 MG PO TABS: 600.0000 mg | ORAL_TABLET | Freq: Four times a day (QID) | ORAL | Status: DC | PRN

## 2015-09-17 NOTE — Telephone Encounter (Signed)
Pt would like to have refill on ibuprofen sent to [facility]

## 2015-09-25 ENCOUNTER — Telehealth: Payer: Self-pay | Admitting: Family Medicine

## 2015-09-25 MED ORDER — LORAZEPAM 0.5 MG PO TABS: 0.5000 mg | ORAL_TABLET | Freq: Two times a day (BID) | ORAL | Status: DC | PRN

## 2015-09-25 NOTE — Telephone Encounter (Signed)
Forward to Amanda Moran

## 2015-09-25 NOTE — Telephone Encounter (Signed)
Pt called stated she can not stop crying, this has never happened to her before. Pt is in hysterics. States she needs to talk to Dr. Wynetta Emery before she leaves today, does not want to end up in the Emergency Room. Thanks.
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
## 2015-09-28 ENCOUNTER — Telehealth: Payer: Self-pay | Admitting: Family Medicine

## 2015-09-28 NOTE — Telephone Encounter (Signed)
Patient needs Dr. Wynetta Emery to fax a doctor note and this is the fax number [place] [facility]: Debby Bud. Patient took Sunday off. So needs doctor note from Saturday & Sunday.

## 2015-09-28 NOTE — Telephone Encounter (Signed)
Please extend patients work note.

## 2015-09-30 ENCOUNTER — Encounter: Payer: Self-pay | Admitting: Family Medicine

## 2015-09-30 NOTE — Telephone Encounter (Signed)
OK to fax over for her

## 2015-10-15 ENCOUNTER — Telehealth: Payer: Self-pay | Admitting: Family Medicine

## 2015-10-15 ENCOUNTER — Ambulatory Visit: Payer: Commercial Managed Care - PPO | Admitting: Family Medicine

## 2015-10-15 NOTE — Telephone Encounter (Signed)
Noted patient to come in for appointment today, but canceled.
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
## 2015-10-21 ENCOUNTER — Telehealth: Payer: Self-pay

## 2015-10-21 NOTE — Telephone Encounter (Signed)
Spoke with patient, she feels like the mood medication that she is taking is not helping and she does not want to be dependant on medication. She wants a referral to psychology, she does not need one, but a list of them was printed for patient to pick up.

## 2015-11-15 ENCOUNTER — Emergency Department
Admission: EM | Admit: 2015-11-15 | Discharge: 2015-11-15 | Disposition: A | Discharge status: Home / Self Care | Payer: Commercial Managed Care - PPO | Attending: Emergency Medicine | Admitting: Emergency Medicine

## 2015-11-15 DIAGNOSIS — M62838 Other muscle spasm: Secondary | ICD-10-CM | POA: Diagnosis not present

## 2015-11-15 DIAGNOSIS — S4992XA Unspecified injury of left shoulder and upper arm, initial encounter: Secondary | ICD-10-CM | POA: Diagnosis not present

## 2015-11-15 DIAGNOSIS — Z87891 Personal history of nicotine dependence: Secondary | ICD-10-CM | POA: Insufficient documentation

## 2015-11-15 DIAGNOSIS — Z79899 Other long term (current) drug therapy: Secondary | ICD-10-CM | POA: Diagnosis not present

## 2015-11-15 DIAGNOSIS — Y998 Other external cause status: Secondary | ICD-10-CM | POA: Insufficient documentation

## 2015-11-15 DIAGNOSIS — E119 Type 2 diabetes mellitus without complications: Secondary | ICD-10-CM | POA: Diagnosis not present

## 2015-11-15 DIAGNOSIS — Y9389 Activity, other specified: Secondary | ICD-10-CM | POA: Insufficient documentation

## 2015-11-15 DIAGNOSIS — S199XXA Unspecified injury of neck, initial encounter: Secondary | ICD-10-CM | POA: Diagnosis present

## 2015-11-15 DIAGNOSIS — Y92410 Unspecified street and highway as the place of occurrence of the external cause: Secondary | ICD-10-CM | POA: Insufficient documentation

## 2015-11-15 MED ORDER — CYCLOBENZAPRINE HCL 10 MG PO TABS: 10.0000 mg | ORAL_TABLET | Freq: Three times a day (TID) | ORAL | Status: DC | PRN

## 2015-11-15 MED ORDER — KETOROLAC TROMETHAMINE 30 MG/ML IJ SOLN
60.0000 mg | Freq: Once | INTRAMUSCULAR | Status: AC
  Administered 2015-11-15: 60 mg via INTRAMUSCULAR
  Filled 2015-11-15: qty 2

## 2015-11-15 MED ORDER — MELOXICAM 15 MG PO TABS: 15.0000 mg | ORAL_TABLET | Freq: Every day | ORAL | Status: DC
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
## 2015-11-15 NOTE — Discharge Instructions (Signed)
Heat Therapy Heat therapy can help ease sore, stiff, injured, and tight muscles and joints. Heat relaxes your muscles, which may help ease your pain.  RISKS AND COMPLICATIONS If you have any of the following conditions, do not use heat therapy unless your health care provider has approved:  Poor circulation.  Healing wounds or scarred skin in the area being treated.  Diabetes, heart disease, or high blood pressure.  Not being able to feel (numbness) the area being treated.  Unusual swelling of the area being treated.  Active infections.  Blood clots.  Cancer.  Inability to communicate pain. This may include young children and people who have problems with their brain function (dementia).  Pregnancy. Heat therapy should only be used on old, pre-existing, or long-lasting (chronic) injuries. Do not use heat therapy on new injuries unless directed by your health care provider. HOW TO USE HEAT THERAPY There are several different kinds of heat therapy, including:  Moist heat pack.  Warm water bath.  Hot water bottle.  Electric heating pad.  Heated gel pack.  Heated wrap.  Electric heating pad. Use the heat therapy method suggested by your health care provider. Follow your health care provider's instructions on when and how to use heat therapy. GENERAL HEAT THERAPY RECOMMENDATIONS  Do not sleep while using heat therapy. Only use heat therapy while you are awake.  Your skin may turn pink while using heat therapy. Do not use heat therapy if your skin turns red.  Do not use heat therapy if you have new pain.  High heat or long exposure to heat can cause burns. Be careful when using heat therapy to avoid burning your skin.  Do not use heat therapy on areas of your skin that are already irritated, such as with a rash or sunburn. SEEK MEDICAL CARE IF:  You have blisters, redness, swelling, or numbness.  You have new pain.  Your pain is worse. MAKE SURE  YOU:  Understand these instructions.  Will watch your condition.  Will get help right away if you are not doing well or get worse.   This information is not intended to replace advice given to you by your health care provider. Make sure you discuss any questions you have with your health care provider.  Muscle Cramps and Spasms    Muscle cramps and spasms occur when a muscle or muscles tighten and you have no control over this tightening (involuntary muscle contraction). They are a common problem and can develop in any muscle. The most common place is in the calf muscles of the leg. Both muscle cramps and muscle spasms are involuntary muscle contractions, but they also have differences:  Muscle cramps are sporadic and painful. They may last a few seconds to a quarter of an hour. Muscle cramps are often more forceful and last longer than muscle spasms.  Muscle spasms may or may not be painful. They may also last just a few seconds or much longer. CAUSES  It is uncommon for cramps or spasms to be due to a serious underlying problem. In many cases, the cause of cramps or spasms is unknown. Some common causes are:  Overexertion.  Overuse from repetitive motions (doing the same thing over and over).  Remaining in a certain position for a long period of time.  Improper preparation, form, or technique while performing a sport or activity.  Dehydration.  Injury.  Side effects of some medicines.  Abnormally low levels of the salts and ions in your  blood (electrolytes), especially potassium and calcium. This could happen if you are taking water pills (diuretics) or you are pregnant.  Some underlying medical problems can make it more likely to develop cramps or spasms. These include, but are not limited to:  Diabetes.  Parkinson disease.  Hormone disorders, such as thyroid problems.  Alcohol abuse.  Diseases specific to muscles, joints, and bones.  Blood vessel disease where not enough blood is  getting to the muscles.  HOME CARE INSTRUCTIONS  Stay well hydrated. Drink enough water and fluids to keep your urine clear or pale yellow.  It may be helpful to massage, stretch, and relax the affected muscle.  For tight or tense muscles, use a warm towel, heating pad, or hot shower water directed to the affected area.  If you are sore or have pain after a cramp or spasm, applying ice to the affected area may relieve discomfort.  Put ice in a plastic bag.  Place a towel between your skin and the bag.  Leave the ice on for 15-20 minutes, 03-04 times a day. Medicines used to treat a known cause of cramps or spasms may help reduce their frequency or severity. Only take over-the-counter or prescription medicines as directed by your caregiver. SEEK MEDICAL CARE IF:  Your cramps or spasms get more severe, more frequent, or do not improve over time.  MAKE SURE YOU:  Understand these instructions.  Will watch your condition.  Will get help right away if you are not doing well or get worse. This information is not intended to replace advice given to you by your health care provider. Make sure you discuss any questions you have with your health care provider.  Document Released: 06/03/2002 Document Revised: 04/08/2013 Document Reviewed: 11/28/2012  Elsevier Interactive Patient Education 2016 [place] Released: 03/05/2012 Document Revised: 01/02/2015 Document Reviewed: 02/04/2014 Elsevier Interactive Patient Education [employer].
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
## 2015-11-23 ENCOUNTER — Encounter: Payer: Self-pay | Admitting: Family Medicine

## 2015-11-23 ENCOUNTER — Ambulatory Visit (INDEPENDENT_AMBULATORY_CARE_PROVIDER_SITE_OTHER): Payer: Commercial Managed Care - PPO | Admitting: Family Medicine

## 2015-11-23 VITALS — BP 109/79 | HR 92 | Temp 99.5°F | Wt 240.0 lb

## 2015-11-23 DIAGNOSIS — S134XXA Sprain of ligaments of cervical spine, initial encounter: Secondary | ICD-10-CM

## 2015-11-23 MED ORDER — CYCLOBENZAPRINE HCL 10 MG PO TABS: ORAL_TABLET | ORAL | Status: DC

## 2015-11-23 MED ORDER — IBUPROFEN 600 MG PO TABS: 600.0000 mg | ORAL_TABLET | Freq: Three times a day (TID) | ORAL | Status: DC | PRN
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
## 2015-11-23 NOTE — Patient Instructions (Signed)
Cervical Sprain  A cervical sprain is an injury in the neck in which the strong, fibrous tissues (ligaments) that connect your neck bones stretch or tear. Cervical sprains can range from mild to severe. Severe cervical sprains can cause the neck vertebrae to be unstable. This can lead to damage of the spinal cord and can result in serious nervous system problems. The amount of time it takes for a cervical sprain to get better depends on the cause and extent of the injury. Most cervical sprains heal in 1 to 3 weeks.  CAUSES   Severe cervical sprains may be caused by:    Contact sport injuries (such as from football, rugby, wrestling, hockey, auto racing, gymnastics, diving, martial arts, or boxing).    Motor vehicle collisions.    Whiplash injuries. This is an injury from a sudden forward and backward whipping movement of the head and neck.   Falls.   Mild cervical sprains may be caused by:    Being in an awkward position, such as while cradling a telephone between your ear and shoulder.    Sitting in a chair that does not offer proper support.    Working at a poorly designed computer station.    Looking up or down for long periods of time.   SYMPTOMS    Pain, soreness, stiffness, or a burning sensation in the front, back, or sides of the neck. This discomfort may develop immediately after the injury or slowly, 24 hours or more after the injury.    Pain or tenderness directly in the middle of the back of the neck.    Shoulder or upper back pain.    Limited ability to move the neck.    Headache.    Dizziness.    Weakness, numbness, or tingling in the hands or arms.    Muscle spasms.    Difficulty swallowing or chewing.    Tenderness and swelling of the neck.   DIAGNOSIS   Most of the time your health care provider can diagnose a cervical sprain by taking your history and doing a physical exam. Your health care provider will ask about previous neck injuries and any known neck  problems, such as arthritis in the neck. X-rays may be taken to find out if there are any other problems, such as with the bones of the neck. Other tests, such as a CT scan or MRI, may also be needed.   TREATMENT   Treatment depends on the severity of the cervical sprain. Mild sprains can be treated with rest, keeping the neck in place (immobilization), and pain medicines. Severe cervical sprains are immediately immobilized. Further treatment is done to help with pain, muscle spasms, and other symptoms and may include:   Medicines, such as pain relievers, numbing medicines, or muscle relaxants.    Physical therapy. This may involve stretching exercises, strengthening exercises, and posture training. Exercises and improved posture can help stabilize the neck, strengthen muscles, and help stop symptoms from returning.   HOME CARE INSTRUCTIONS    Put ice on the injured area.     Put ice in a plastic bag.     Place a towel between your skin and the bag.     Leave the ice on for 15-20 minutes, 3-4 times a day.    If your injury was severe, you may have been given a cervical collar to wear. A cervical collar is a two-piece collar designed to keep your neck from moving while it heals.      Do not remove the collar unless instructed by your health care provider.    If you have long hair, keep it outside of the collar.    Ask your health care provider before making any adjustments to your collar. Minor adjustments may be required over time to improve comfort and reduce pressure on your chin or on the back of your head.    Ifyou are allowed to remove the collar for cleaning or bathing, follow your health care provider's instructions on how to do so safely.    Keep your collar clean by wiping it with mild soap and water and drying it completely. If the collar you have been given includes removable pads, remove them every 1-2 days and hand wash them with soap and water. Allow them to air dry. They should be completely  dry before you wear them in the collar.    If you are allowed to remove the collar for cleaning and bathing, wash and dry the skin of your neck. Check your skin for irritation or sores. If you see any, tell your health care provider.    Do not drive while wearing the collar.    Only take over-the-counter or prescription medicines for pain, discomfort, or fever as directed by your health care provider.    Keep all follow-up appointments as directed by your health care provider.    Keep all physical therapy appointments as directed by your health care provider.    Make any needed adjustments to your workstation to promote good posture.    Avoid positions and activities that make your symptoms worse.    Warm up and stretch before being active to help prevent problems.   SEEK MEDICAL CARE IF:    Your pain is not controlled with medicine.    You are unable to decrease your pain medicine over time as planned.    Your activity level is not improving as expected.   SEEK IMMEDIATE MEDICAL CARE IF:    You develop any bleeding.   You develop stomach upset.   You have signs of an allergic reaction to your medicine.    Your symptoms get worse.    You develop new, unexplained symptoms.    You have numbness, tingling, weakness, or paralysis in any part of your body.   MAKE SURE YOU:    Understand these instructions.   Will watch your condition.   Will get help right away if you are not doing well or get worse.     This information is not intended to replace advice given to you by your health care provider. Make sure you discuss any questions you have with your health care provider.     Document Released: 10/09/2007 Document Revised: 12/17/2013 Document Reviewed: 06/19/2013  Elsevier Interactive Patient Education 2016 [employer]

## 2015-11-25 ENCOUNTER — Telehealth: Payer: Self-pay | Admitting: Family Medicine

## 2015-11-25 NOTE — Telephone Encounter (Signed)
Pt would like a letter for light duty vs being out of work.  She does not have PAL time to cover being out and thinks she can do light duty ok.  She would like a note today if at all possible.

## 2015-11-26 NOTE — Telephone Encounter (Signed)
Routing to provider.  Dr. 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

## 2015-11-27 NOTE — Telephone Encounter (Signed)
Letter generated, patient notified that she can pick it up.

## 2015-11-27 NOTE — Telephone Encounter (Signed)
She can do light duty. No lifting over 10lbs. Needs to be able to sit and rest as needed. Please let her know that she shouldn't work if she is taking the medicine because it's going to make her sleepy. Thanks!

## 2015-11-30 ENCOUNTER — Telehealth: Payer: Self-pay

## 2015-11-30 NOTE — Telephone Encounter (Signed)
Letter generated and on your desk. OK to fax over or for her to pick up

## 2015-11-30 NOTE — Telephone Encounter (Signed)
Patient called, she states that the work note was not specific enough, that her job still gave her a regular assignment and told her not to lift anything. 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 She would like a more specific note.

## 2016-04-02 ENCOUNTER — Other Ambulatory Visit: Payer: Self-pay | Admitting: Family Medicine

## 2016-06-07 IMAGING — US TRANSABDOMINAL ULTRASOUND OF PELVIS
1 series · 13 of 25 positions shown · non-contrast
Comparison: 11/06/2009

CLINICAL DATA: Anemia, history of uterine fibroids

EXAM:
TRANSABDOMINAL ULTRASOUND OF PELVIS
TECHNIQUE: Transabdominal ultrasound examination of the pelvis was performed
including evaluation of the uterus, ovaries, adnexal regions, and
pelvic cul-de-sac.

[Series 1: transabdominal ultrasound of pelvis · 0.25mm/px · 13 of 126 slices shown]
[im 1/126]
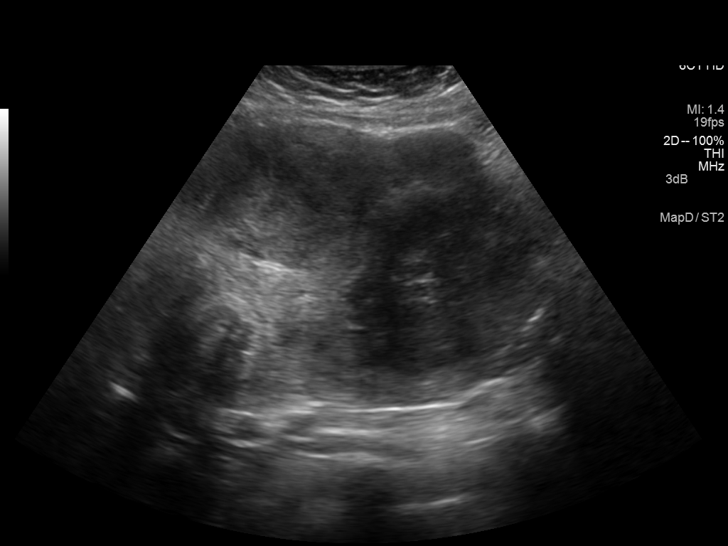
[im 11/126]
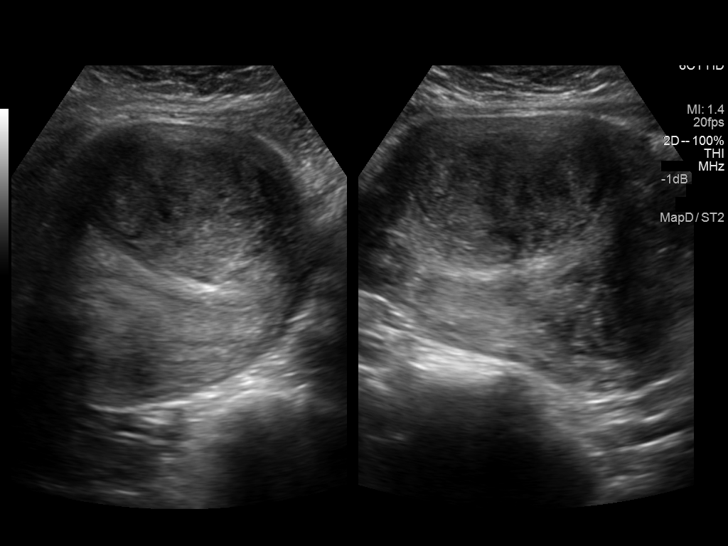
[im 21/126]
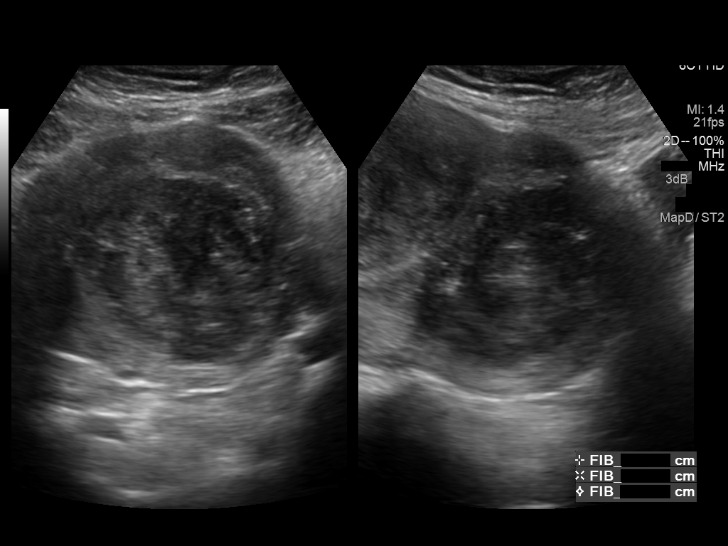
[im 32/126]
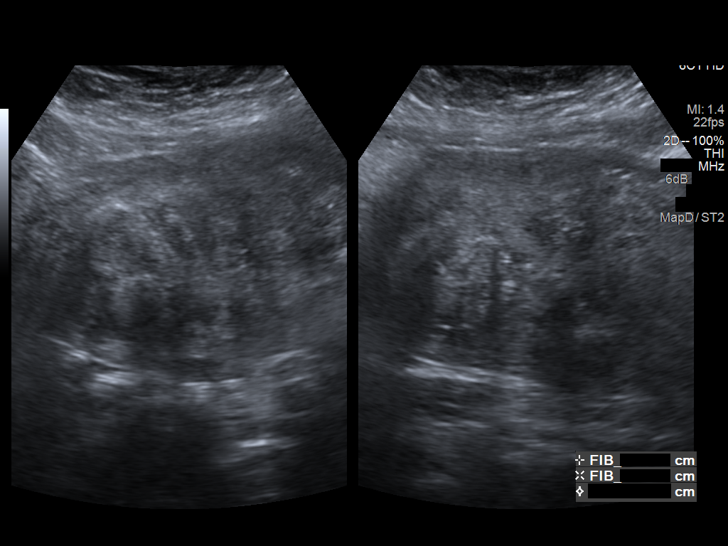
[im 42/126]
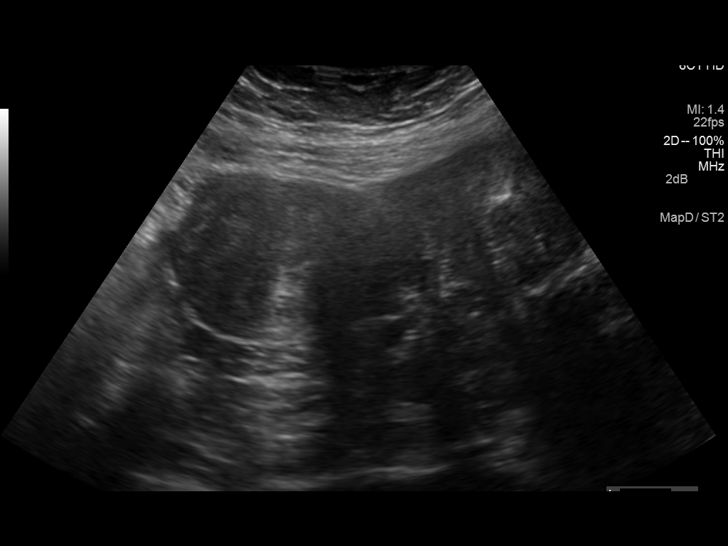
[im 53/126]
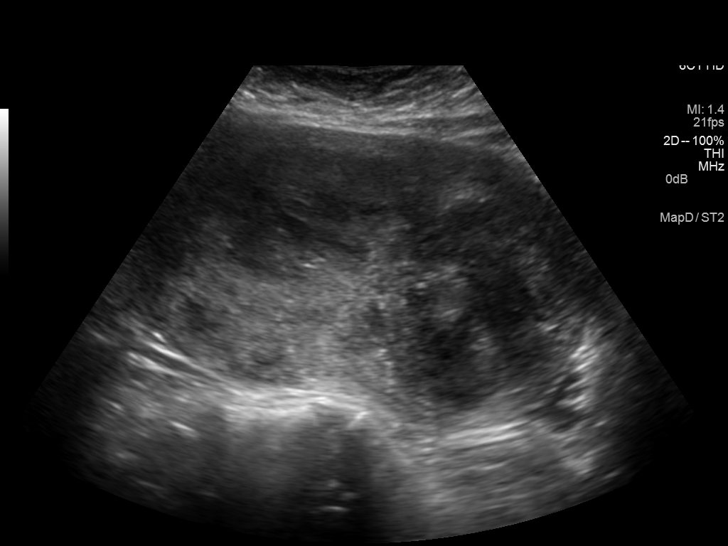
[im 63/126]
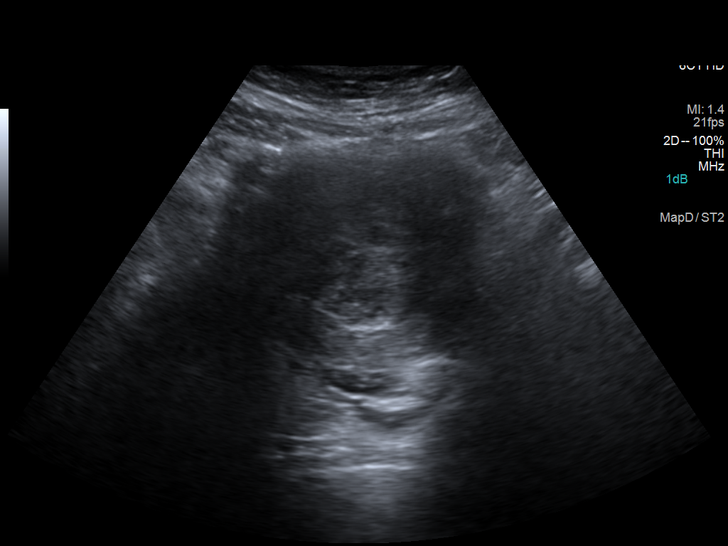
[im 73/126]
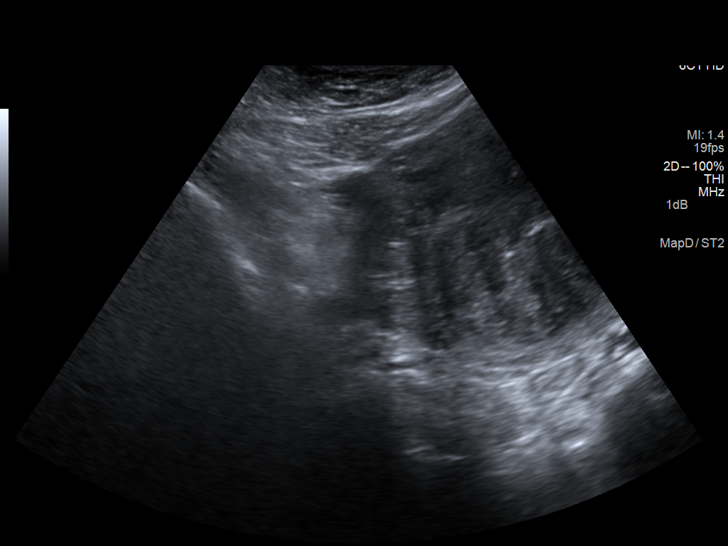
[im 84/126]
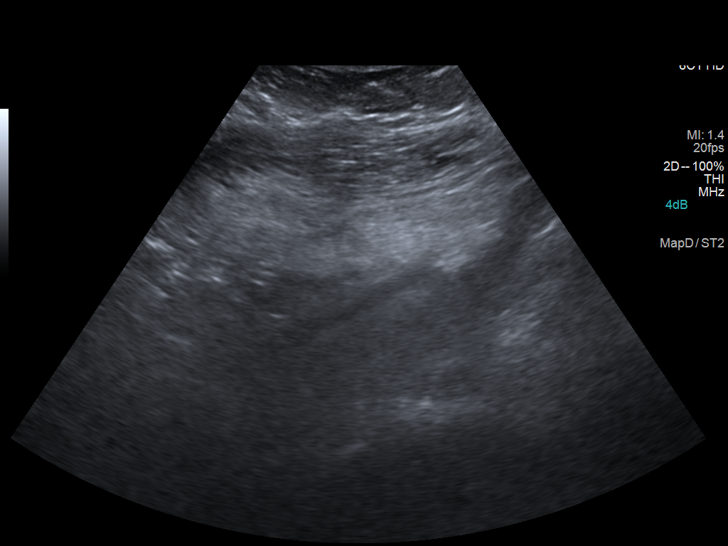
[im 94/126]
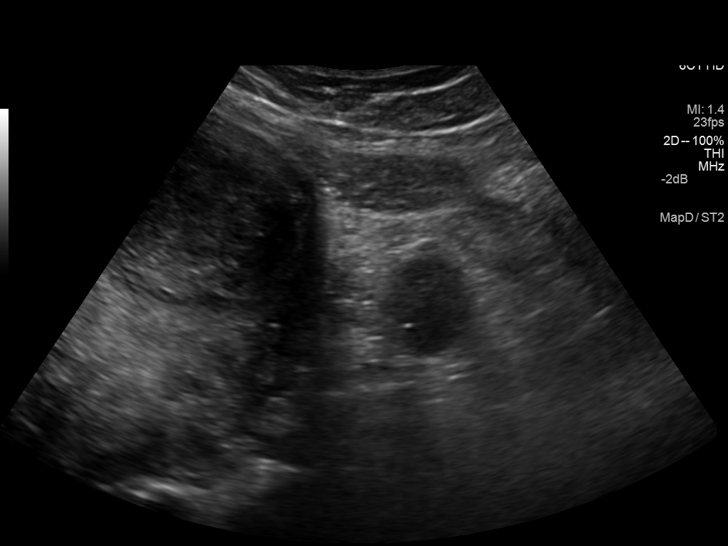
[im 105/126]
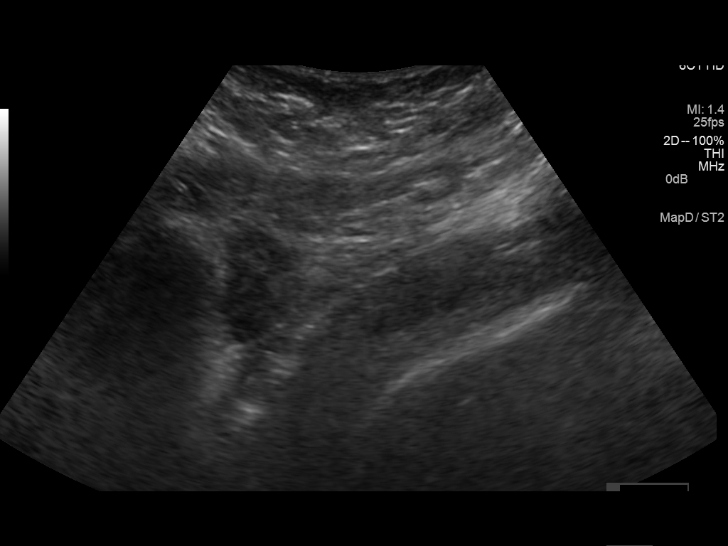
[im 115/126]
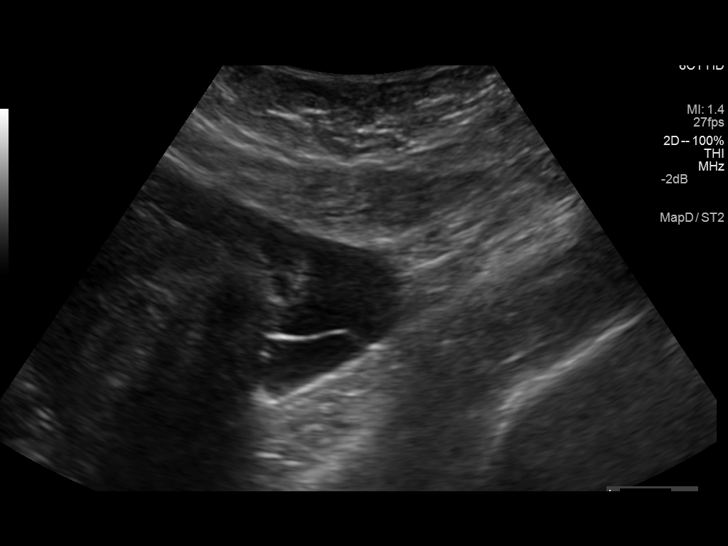
[im 126/126]
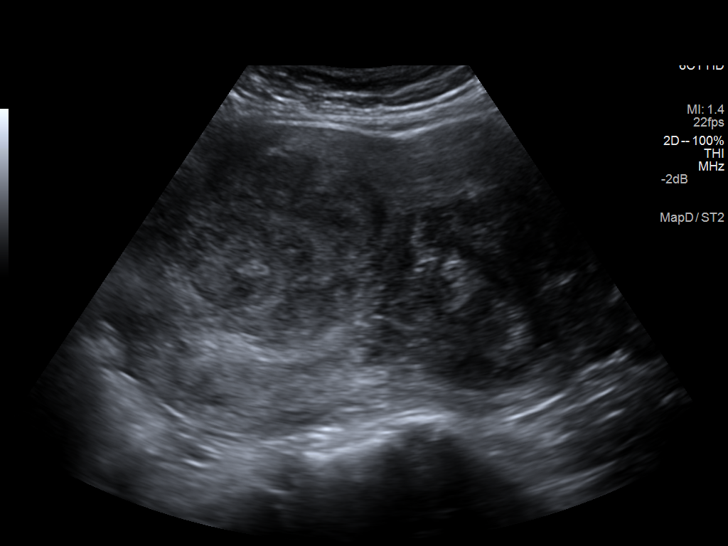

[13 of 25 positions shown; findings below may reference images not displayed]

FINDINGS: Uterus

Measurements: 13.8 x 10.5 x 9.8 cm. Multiple fibroids, including:

--7.2 x 7.5 x 4.7 cm intramural anterior fundal fibroid

--6.9 x 6.0 x 6.4 cm intramural lower uterine segment fibroid

--3.6 x 4.0 x 3.1 cm intramural right lateral fundal fibroid

--5.7 x 7.4 x 4.9 cm pedunculated right lateral fundal fibroid

Previously, the dominant anterior fundal fibroid measured up to
cm.

Endometrium

Not discretely visualized, distorted by multiple fibroids.

Right ovary

Measurements: 3.0 x 2.1 x 2.0 cm. Dilated tubal structure adjacent
to the right ovary, measuring 4.0 x 2.8 x 2.2 cm, favored to reflect
right hydrosalpinx.

Left ovary

Measurements: 4.1 x 2.3 x 2.6 cm. Normal appearance/no adnexal mass.

Other findings:  No free fluid
IMPRESSION: Multiple uterine fibroids, including a dominant anterior fundal
fibroid measuring up to 7.5 cm, previously 3.5 cm.

Endometrial complex is distorted by multiple fibroids and not
discretely visualized.

Right hydrosalpinx.

## 2016-06-22 ENCOUNTER — Encounter: Payer: Commercial Managed Care - PPO | Admitting: Family Medicine

## 2016-07-04 ENCOUNTER — Telehealth: Payer: Self-pay | Admitting: Family Medicine

## 2016-07-04 MED ORDER — IBUPROFEN 600 MG PO TABS: ORAL_TABLET | ORAL | Status: DC

## 2016-07-04 NOTE — Telephone Encounter (Signed)
Rx sent to her pharmacy 

## 2016-07-04 NOTE — Telephone Encounter (Signed)
Pt would like a refill for ibuprofen (ADVIL,MOTRIN) 600 MG tablet sent to [facility].

## 2016-07-07 ENCOUNTER — Ambulatory Visit (INDEPENDENT_AMBULATORY_CARE_PROVIDER_SITE_OTHER): Payer: Commercial Managed Care - PPO | Admitting: Family Medicine

## 2016-07-07 ENCOUNTER — Encounter: Payer: Self-pay | Admitting: Family Medicine

## 2016-07-07 VITALS — BP 127/84 | HR 82 | Temp 99.1°F | Wt 237.0 lb

## 2016-07-07 DIAGNOSIS — M25561 Pain in right knee: Secondary | ICD-10-CM | POA: Diagnosis not present

## 2016-07-07 MED ORDER — MELOXICAM 15 MG PO TABS: 15.0000 mg | ORAL_TABLET | Freq: Every day | ORAL | Status: DC

## 2016-07-07 NOTE — Progress Notes (Addendum)
   BP 127/84 mmHg  Pulse 82  Temp(Src) 99.1 F (37.3 C)  Wt 237 lb (107.502 kg)  SpO2 97%  LMP  (LMP Unknown)   Subjective:    Patient ID: Amanda Moran, female    DOB: 12-04-1971, 45 y.o.   MRN: [id number]  HPI: [name] is a 45 y.o. female  Chief Complaint  Patient presents with  . Knee Pain    right knee x 1 week, pain and swelling, gives out on her. Usually takes Ibuprofen and it helps, but not working now. has had orthoscopic surgeries in the past and they told her she'd eventually need a replacement.   Has been having issues with right knee pain, swelling, and locking up for about 5 years now, had multiple arthroscopic procedures with [facility] at this time and was eventually told she would need a total knee replacement. Has had joint injections in the past with good short term relief. Patient is hoping to schedule the surgery in October when she gets her benefits. 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 Currently taking 600 mg ibuprofen as needed, which previously was helping but is not sufficient in this recent flare.   Relevant past medical, surgical, family and social history reviewed and updated as indicated. Interim medical history since our last visit reviewed. Allergies and medications reviewed and updated.  Review of Systems  Constitutional: Negative for fever and chills.  Respiratory: Negative.   Cardiovascular: Negative.   Musculoskeletal: Positive for joint swelling and arthralgias (right knee).  Skin: Negative.   Neurological: Negative.   Psychiatric/Behavioral: Negative.     Per HPI unless specifically indicated above     Objective:    BP 127/84 mmHg  Pulse 82  Temp(Src) 99.1 F (37.3 C)  Wt 237 lb (107.502 kg)  SpO2 97%  LMP  (LMP Unknown)  Wt Readings from Last 3 Encounters:  07/07/16 237 lb (107.502 kg)  11/23/15 240 lb (108.863 kg)  11/15/15 230 lb (104.327 kg)    Physical Exam   Constitutional: She is oriented to person, place, and time. She appears well-developed and well-nourished.  HENT:  Head: Atraumatic.  Eyes: Conjunctivae are normal. No scleral icterus.  Neck: Normal range of motion. Neck supple.  Cardiovascular: Normal rate and normal heart sounds.   Pulmonary/Chest: Effort normal. No respiratory distress.  Musculoskeletal: Normal range of motion. She exhibits edema (right knee) and tenderness (right knee TTP).  Neurological: She is alert and oriented to person, place, and time.  Skin: Skin is warm and dry.  Psychiatric: She has a normal mood and affect. Her behavior is normal.        Assessment & Plan:   Problem List Items Addressed This Visit    None    Visit Diagnoses    Right knee pain    -  Primary     Right knee injection performed today at separate appointment. Please see that note for procedure.  Meloxicam given, patient instructed to d/c ibuprofen when taking meloxicam. Ultimately, patient will schedule knee replacement this fall once benefits kick in.    Follow up plan: No Follow-up on file.

## 2016-07-07 NOTE — Addendum Note (Signed)
Addended by: Valerie Roys on: 07/07/2016 04:20 PM   Modules accepted: Miquel Dunn

## 2016-07-07 NOTE — Patient Instructions (Signed)
Follow up if no improvement 

## 2016-07-07 NOTE — Progress Notes (Signed)
Needs steroid injection in R knee. Saw my partner earlier today.   Procedure: Right  Knee Intraarticular Steroid Injection        Diagnosis:   ICD-9-CM ICD-10-CM   1. Right knee pain 719.46 M25.561    Steroid injection given today.    Physician: MJ Consent:  Risks, benefits, and alternative treatments discussed and all questions were answered.  Patient elected to proceed and verbal consent obtained.  Description: Area prepped and draped using  semi-sterile technique.  Using a anterior/lateral approach, a mixture of 5 cc of  0.5% marcaine & 2 cc of Kenalog 40 was injected into knee joint.  A bandage was then placed over the injection site Complications: none Post Procedure Instructions: Wound care instructions discussed and patient was instructed to keep area clean and dry.  Signs and symptoms of infection discussed, patient agrees to contact the office ASAP should they occur.  Follow Up: Return if symptoms worsen or fail to improve.

## 2016-07-19 ENCOUNTER — Encounter (INDEPENDENT_AMBULATORY_CARE_PROVIDER_SITE_OTHER): Payer: Self-pay

## 2016-07-28 ENCOUNTER — Encounter: Payer: Commercial Managed Care - PPO | Admitting: Family Medicine

## 2016-08-20 ENCOUNTER — Emergency Department: Payer: Commercial Managed Care - PPO

## 2016-08-20 ENCOUNTER — Encounter: Payer: Self-pay | Admitting: Emergency Medicine

## 2016-08-20 ENCOUNTER — Emergency Department
Admission: EM | Admit: 2016-08-20 | Discharge: 2016-08-20 | Disposition: A | Discharge status: Home / Self Care | Payer: Commercial Managed Care - PPO | Attending: Emergency Medicine | Admitting: Emergency Medicine

## 2016-08-20 DIAGNOSIS — E119 Type 2 diabetes mellitus without complications: Secondary | ICD-10-CM | POA: Insufficient documentation

## 2016-08-20 DIAGNOSIS — Y939 Activity, unspecified: Secondary | ICD-10-CM | POA: Diagnosis not present

## 2016-08-20 DIAGNOSIS — Y999 Unspecified external cause status: Secondary | ICD-10-CM | POA: Diagnosis not present

## 2016-08-20 DIAGNOSIS — S8392XA Sprain of unspecified site of left knee, initial encounter: Secondary | ICD-10-CM | POA: Diagnosis not present

## 2016-08-20 DIAGNOSIS — F17210 Nicotine dependence, cigarettes, uncomplicated: Secondary | ICD-10-CM | POA: Insufficient documentation

## 2016-08-20 DIAGNOSIS — S8992XA Unspecified injury of left lower leg, initial encounter: Secondary | ICD-10-CM | POA: Diagnosis present

## 2016-08-20 DIAGNOSIS — Y92512 Supermarket, store or market as the place of occurrence of the external cause: Secondary | ICD-10-CM | POA: Insufficient documentation

## 2016-08-20 DIAGNOSIS — W010XXA Fall on same level from slipping, tripping and stumbling without subsequent striking against object, initial encounter: Secondary | ICD-10-CM | POA: Diagnosis not present

## 2016-08-20 MED ORDER — OXYCODONE-ACETAMINOPHEN 5-325 MG PO TABS
1.0000 | ORAL_TABLET | Freq: Once | ORAL | Status: AC
  Administered 2016-08-20: 1 via ORAL
  Filled 2016-08-20: qty 1

## 2016-08-20 MED ORDER — IBUPROFEN 800 MG PO TABS
800.0000 mg | ORAL_TABLET | Freq: Once | ORAL | Status: AC
  Administered 2016-08-20: 800 mg via ORAL
  Filled 2016-08-20: qty 1

## 2016-08-20 MED ORDER — IBUPROFEN 800 MG PO TABS: 800.0000 mg | ORAL_TABLET | Freq: Three times a day (TID) | ORAL | 0 refills | Status: DC | PRN

## 2016-08-20 MED ORDER — OXYCODONE-ACETAMINOPHEN 7.5-325 MG PO TABS: 1.0000 | ORAL_TABLET | Freq: Four times a day (QID) | ORAL | 0 refills | Status: DC | PRN

## 2016-08-20 NOTE — ED Triage Notes (Signed)
Patient states she slipped on a puddle of water in the floor at [employer] and hurt her knee. Patient states that she felt something in her knee pop and since then she has been unable to put pressure on it. Patient she positive pulses and is able to move leg.

## 2016-08-20 NOTE — ED Provider Notes (Signed)
[facility] Emergency Department Provider Note   ____________________________________________   None    (approximate)  I have reviewed the triage vital signs and the nursing notes.   HISTORY  Chief Complaint Knee Pain    HPI Amanda Moran is a 45 y.o. female patient complaining of left knee pain secondary to a slip and fall in grocery store prior to arrival. Patient states she felt something pop in her knee has been unable to weight-bear secondary to pain since incident. Patient did not land on her knees when she had a near fall.Patient rates the pain as a 7/10. Patient described a pain as "achy". Patient stated pain becomes "sharp" when she tries to bear weight. No palliative measures taken for this complaint prior to arrival. Patient is given ice pack in triage.   Past Medical History:  Diagnosis Date  . Acanthosis nigricans   . Anemia   . Diabetes mellitus without complication ([place])   . Fibroids   . Fibroids   . Headache   . Iron deficiency anemia     Patient Active Problem List   Diagnosis Date Noted  . Diet-controlled type 2 diabetes mellitus ([place]) 08/06/2015  . Iron deficiency anemia due to chronic blood loss 08/06/2015  . Chronic anemia 05/31/2015    Past Surgical History:  Procedure Laterality Date  . ABDOMINAL HYSTERECTOMY  05/29/2015   Procedure: HYSTERECTOMY ABDOMINAL;  Surgeon: Ruffin Frederick, MD;  Location: [facility] ORS;  Service: Gynecology;;  . Lorin Mercy    . KNEE ARTHROSCOPY Right   . LAPAROSCOPIC SUPRACERVICAL HYSTERECTOMY N/A 05/29/2015   Procedure: LAPAROSCOPIC SUPRACERVICAL HYSTERECTOMY;  Surgeon: Ruffin Frederick, MD;  Location: [facility] ORS;  Service: Gynecology;  Laterality: N/A;  attempted  . TUBAL LIGATION      Prior to Admission medications   Medication Sig Start Date End Date Taking? Authorizing Provider  ibuprofen (ADVIL,MOTRIN) 600 MG tablet TAKE 1 TABLET (600 MG TOTAL) BY MOUTH EVERY 8 (EIGHT) HOURS  AS NEEDED. 07/04/16   Megan P Johnson, DO  ibuprofen (ADVIL,MOTRIN) 800 MG tablet Take 1 tablet (800 mg total) by mouth every 8 (eight) hours as needed for moderate pain. 08/20/16   Sable Feil, PA-C  meloxicam (MOBIC) 15 MG tablet Take 1 tablet (15 mg total) by mouth daily. 07/07/16   Volney American, PA-C  oxyCODONE-acetaminophen (PERCOCET) 7.5-325 MG tablet Take 1 tablet by mouth every 6 (six) hours as needed for severe pain. 08/20/16   Sable Feil, PA-C    Allergies Review of patient's allergies indicates no known allergies.  Family History  Problem Relation Age of Onset  . Alcohol abuse Father   . Arthritis Father   . Diabetes Father   . Drug abuse Father   . Cancer Father     pancreatic  . Diabetes Brother   . Cancer Mother     cervical    Social History Social History  Substance Use Topics  . Smoking status: Current Every Day Smoker    Packs/day: 1.00    Types: Cigarettes  . Smokeless tobacco: Never Used  . Alcohol use Yes     Comment: socially    Review of Systems Constitutional: No fever/chills Eyes: No visual changes. ENT: No sore throat. Cardiovascular: Denies chest pain. Respiratory: Denies shortness of breath. Gastrointestinal: No abdominal pain.  No nausea, no vomiting.  No diarrhea.  No constipation. Genitourinary: Negative for dysuria. Musculoskeletal: Negative for back pain. Skin: Negative for rash. Neurological: Negative for headaches, focal weakness  or numbness. Endocrine:Diabetes Hematological/Lymphatic:Iron deficient anemia   ____________________________________________   PHYSICAL EXAM:  VITAL SIGNS: ED Triage Vitals  Enc Vitals Group     BP 08/20/16 1637 124/89     Pulse Rate 08/20/16 1637 97     Resp 08/20/16 1637 16     Temp 08/20/16 1637 98.1 F (36.7 C)     Temp Source 08/20/16 1637 Oral     SpO2 08/20/16 1637 95 %     Weight 08/20/16 1639 235 lb (106.6 kg)     Height 08/20/16 1639 5\' 3"  (1.6 m)     Head  Circumference --      Peak Flow --      Pain Score 08/20/16 1634 7     Pain Loc --      Pain Edu? --      Excl. in [place]? --     Constitutional: Alert and oriented. Well appearing and in no acute distress. Morbid obese Eyes: Conjunctivae are normal. PERRL. EOMI. Head: Atraumatic. Nose: No congestion/rhinnorhea. Mouth/Throat: Mucous membranes are moist.  Oropharynx non-erythematous. Neck: No stridor.  No cervical spine tenderness to palpation. Hematological/Lymphatic/Immunilogical: No cervical lymphadenopathy. Cardiovascular: Normal rate, regular rhythm. Grossly normal heart sounds.  Good peripheral circulation. Respiratory: Normal respiratory effort.  No retractions. Lungs CTAB. Gastrointestinal: Soft and nontender. No distention. No abdominal bruits. No CVA tenderness. Musculoskeletal: No obvious deformity to the left knee. Patient has some moderate guarding EDU collateral ligament insertion point.  Neurologic:  Normal speech and language. No gross focal neurologic deficits are appreciated. No gait instability. Skin:  Skin is warm, dry and intact. No rash noted. Psychiatric: Mood and affect are normal. Speech and behavior are normal.  ____________________________________________   LABS (all labs ordered are listed, but only abnormal results are displayed)  Labs Reviewed - No data to display ____________________________________________  EKG   ____________________________________________  RADIOLOGY  No acute final x-ray of the left knee. Incidental finding of a Baker's cyst left knee. ____________________________________________   PROCEDURES  Procedure(s) performed: None  Procedures  Critical Care performed: No  ____________________________________________   INITIAL IMPRESSION / ASSESSMENT AND PLAN / ED COURSE  Pertinent labs & imaging results that were available during my care of the patient were reviewed by me and considered in my medical decision making (see  chart for details). Sprain left knee. Discussed negative x-ray finding with patient. Patient placed in a Ace wrap and given crutches to ambulate. Patient given discharge care instructions. Patient get a prescription for Percocets and ibuprofen. Patient advised follow-up family doctor this condition persists.  Clinical Course     ____________________________________________   FINAL CLINICAL IMPRESSION(S) / ED DIAGNOSES  Final diagnoses:  Left knee sprain, initial encounter      NEW MEDICATIONS STARTED DURING THIS VISIT:  New Prescriptions   IBUPROFEN (ADVIL,MOTRIN) 800 MG TABLET    Take 1 tablet (800 mg total) by mouth every 8 (eight) hours as needed for moderate pain.   OXYCODONE-ACETAMINOPHEN (PERCOCET) 7.5-325 MG TABLET    Take 1 tablet by mouth every 6 (six) hours as needed for severe pain.     Note:  This document was prepared using Dragon voice recognition software and may include unintentional dictation errors.    Sable Feil, PA-C 08/20/16 1802    Earleen Newport, MD 08/21/16 [phone]

## 2016-08-22 ENCOUNTER — Encounter: Payer: Self-pay | Admitting: Family Medicine

## 2016-08-22 ENCOUNTER — Ambulatory Visit (INDEPENDENT_AMBULATORY_CARE_PROVIDER_SITE_OTHER): Payer: Commercial Managed Care - PPO | Admitting: Family Medicine

## 2016-08-22 VITALS — BP 120/86 | HR 110 | Temp 98.9°F | Ht 64.6 in | Wt 237.0 lb

## 2016-08-22 DIAGNOSIS — E119 Type 2 diabetes mellitus without complications: Secondary | ICD-10-CM | POA: Diagnosis not present

## 2016-08-22 DIAGNOSIS — Z0000 Encounter for general adult medical examination without abnormal findings: Secondary | ICD-10-CM

## 2016-08-22 DIAGNOSIS — Z1239 Encounter for other screening for malignant neoplasm of breast: Secondary | ICD-10-CM

## 2016-08-22 DIAGNOSIS — J020 Streptococcal pharyngitis: Secondary | ICD-10-CM

## 2016-08-22 DIAGNOSIS — J029 Acute pharyngitis, unspecified: Secondary | ICD-10-CM | POA: Diagnosis not present

## 2016-08-22 DIAGNOSIS — D500 Iron deficiency anemia secondary to blood loss (chronic): Secondary | ICD-10-CM

## 2016-08-22 DIAGNOSIS — R7301 Impaired fasting glucose: Secondary | ICD-10-CM | POA: Diagnosis not present

## 2016-08-22 LAB — MICROALBUMIN, URINE

## 2016-08-22 LAB — HEMOGLOBIN A1C: HEMOGLOBIN A1C: 5.7

## 2016-08-22 LAB — RAPID STREP SCREEN (MED CTR MEBANE ONLY): STREP GP A AG, IA W/REFLEX: POSITIVE — AB

## 2016-08-22 MED ORDER — AMOXICILLIN 500 MG PO CAPS: 500.0000 mg | ORAL_CAPSULE | Freq: Two times a day (BID) | ORAL | 0 refills | Status: DC

## 2016-08-22 NOTE — Assessment & Plan Note (Signed)
Improved since hysterectomy. Checking labs today. Await results.

## 2016-08-22 NOTE — Assessment & Plan Note (Signed)
A1c 5.7. Continue current regimen. Continue to monitor.

## 2016-08-22 NOTE — Patient Instructions (Addendum)
Health Maintenance, Female Adopting a healthy lifestyle and getting preventive care can go a long way to promote health and wellness. Talk with your health care provider about what schedule of regular examinations is right for you. This is a good chance for you to check in with your provider about disease prevention and staying healthy. In between checkups, there are plenty of things you can do on your own. Experts have done a lot of research about which lifestyle changes and preventive measures are most likely to keep you healthy. Ask your health care provider for more information. WEIGHT AND DIET  Eat a healthy diet  Be sure to include plenty of vegetables, fruits, low-fat dairy products, and lean protein.  Do not eat a lot of foods high in solid fats, added sugars, or salt.  Get regular exercise. This is one of the most important things you can do for your health.  Most adults should exercise for at least 150 minutes each week. The exercise should increase your heart rate and make you sweat (moderate-intensity exercise).  Most adults should also do strengthening exercises at least twice a week. This is in addition to the moderate-intensity exercise.  Maintain a healthy weight  Body mass index (BMI) is a measurement that can be used to identify possible weight problems. It estimates body fat based on height and weight. Your health care provider can help determine your BMI and help you achieve or maintain a healthy weight.  For females 20 years of age and older:   A BMI below 18.5 is considered underweight.  A BMI of 18.5 to 24.9 is normal.  A BMI of 25 to 29.9 is considered overweight.  A BMI of 30 and above is considered obese.  Watch levels of cholesterol and blood lipids  You should start having your blood tested for lipids and cholesterol at 44 years of age, then have this test every 5 years.  You may need to have your cholesterol levels checked more often if:  Your lipid  or cholesterol levels are high.  You are older than 44 years of age.  You are at high risk for heart disease.  CANCER SCREENING   Lung Cancer  Lung cancer screening is recommended for adults 55-80 years old who are at high risk for lung cancer because of a history of smoking.  A yearly low-dose CT scan of the lungs is recommended for people who:  Currently smoke.  Have quit within the past 15 years.  Have at least a 30-pack-year history of smoking. A pack year is smoking an average of one pack of cigarettes a day for 1 year.  Yearly screening should continue until it has been 15 years since you quit.  Yearly screening should stop if you develop a health problem that would prevent you from having lung cancer treatment.  Breast Cancer  Practice breast self-awareness. This means understanding how your breasts normally appear and feel.  It also means doing regular breast self-exams. Let your health care provider know about any changes, no matter how small.  If you are in your 20s or 30s, you should have a clinical breast exam (CBE) by a health care provider every 1-3 years as part of a regular health exam.  If you are 40 or older, have a CBE every year. Also consider having a breast X-ray (mammogram) every year.  If you have a family history of breast cancer, talk to your health care provider about genetic screening.  If you   are at high risk for breast cancer, talk to your health care provider about having an MRI and a mammogram every year.  Breast cancer gene (BRCA) assessment is recommended for women who have family members with BRCA-related cancers. BRCA-related cancers include:  Breast.  Ovarian.  Tubal.  Peritoneal cancers.  Results of the assessment will determine the need for genetic counseling and BRCA1 and BRCA2 testing. Cervical Cancer Your health care provider may recommend that you be screened regularly for cancer of the pelvic organs (ovaries, uterus, and  vagina). This screening involves a pelvic examination, including checking for microscopic changes to the surface of your cervix (Pap test). You may be encouraged to have this screening done every 3 years, beginning at age 21.  For women ages 30-65, health care providers may recommend pelvic exams and Pap testing every 3 years, or they may recommend the Pap and pelvic exam, combined with testing for human papilloma virus (HPV), every 5 years. Some types of HPV increase your risk of cervical cancer. Testing for HPV may also be done on women of any age with unclear Pap test results.  Other health care providers may not recommend any screening for nonpregnant women who are considered low risk for pelvic cancer and who do not have symptoms. Ask your health care provider if a screening pelvic exam is right for you.  If you have had past treatment for cervical cancer or a condition that could lead to cancer, you need Pap tests and screening for cancer for at least 20 years after your treatment. If Pap tests have been discontinued, your risk factors (such as having a new sexual partner) need to be reassessed to determine if screening should resume. Some women have medical problems that increase the chance of getting cervical cancer. In these cases, your health care provider may recommend more frequent screening and Pap tests. Colorectal Cancer  This type of cancer can be detected and often prevented.  Routine colorectal cancer screening usually begins at 44 years of age and continues through 44 years of age.  Your health care provider may recommend screening at an earlier age if you have risk factors for colon cancer.  Your health care provider may also recommend using home test kits to check for hidden blood in the stool.  A small camera at the end of a tube can be used to examine your colon directly (sigmoidoscopy or colonoscopy). This is done to check for the earliest forms of colorectal  cancer.  Routine screening usually begins at age 50.  Direct examination of the colon should be repeated every 5-10 years through 44 years of age. However, you may need to be screened more often if early forms of precancerous polyps or small growths are found. Skin Cancer  Check your skin from head to toe regularly.  Tell your health care provider about any new moles or changes in moles, especially if there is a change in a mole's shape or color.  Also tell your health care provider if you have a mole that is larger than the size of a pencil eraser.  Always use sunscreen. Apply sunscreen liberally and repeatedly throughout the day.  Protect yourself by wearing long sleeves, pants, a wide-brimmed hat, and sunglasses whenever you are outside. HEART DISEASE, DIABETES, AND HIGH BLOOD PRESSURE   High blood pressure causes heart disease and increases the risk of stroke. High blood pressure is more likely to develop in:  People who have blood pressure in the high end   of the normal range (130-139/85-89 mm Hg).  People who are overweight or obese.  People who are African American.  If you are 38-23 years of age, have your blood pressure checked every 3-5 years. If you are 61 years of age or older, have your blood pressure checked every year. You should have your blood pressure measured twice--once when you are at a hospital or clinic, and once when you are not at a hospital or clinic. Record the average of the two measurements. To check your blood pressure when you are not at a hospital or clinic, you can use:  An automated blood pressure machine at a pharmacy.  A home blood pressure monitor.  If you are between 45 years and 39 years old, ask your health care provider if you should take aspirin to prevent strokes.  Have regular diabetes screenings. This involves taking a blood sample to check your fasting blood sugar level.  If you are at a normal weight and have a low risk for diabetes,  have this test once every three years after 44 years of age.  If you are overweight and have a high risk for diabetes, consider being tested at a younger age or more often. PREVENTING INFECTION  Hepatitis B  If you have a higher risk for hepatitis B, you should be screened for this virus. You are considered at high risk for hepatitis B if:  You were born in a country where hepatitis B is common. Ask your health care provider which countries are considered high risk.  Your parents were born in a high-risk country, and you have not been immunized against hepatitis B (hepatitis B vaccine).  You have HIV or AIDS.  You use needles to inject street drugs.  You live with someone who has hepatitis B.  You have had sex with someone who has hepatitis B.  You get hemodialysis treatment.  You take certain medicines for conditions, including cancer, organ transplantation, and autoimmune conditions. Hepatitis C  Blood testing is recommended for:  Everyone born from [birth year] through 1964.  Anyone with known risk factors for hepatitis C. Sexually transmitted infections (STIs)  You should be screened for sexually transmitted infections (STIs) including gonorrhea and chlamydia if:  You are sexually active and are younger than 44 years of age.  You are older than 44 years of age and your health care provider tells you that you are at risk for this type of infection.  Your sexual activity has changed since you were last screened and you are at an increased risk for chlamydia or gonorrhea. Ask your health care provider if you are at risk.  If you do not have HIV, but are at risk, it may be recommended that you take a prescription medicine daily to prevent HIV infection. This is called pre-exposure prophylaxis (PrEP). You are considered at risk if:  You are sexually active and do not regularly use condoms or know the HIV status of your partner(s).  You take drugs by injection.  You are sexually  active with a partner who has HIV. Talk with your health care provider about whether you are at high risk of being infected with HIV. If you choose to begin PrEP, you should first be tested for HIV. You should then be tested every 3 months for as long as you are taking PrEP.  PREGNANCY   If you are premenopausal and you may become pregnant, ask your health care provider about preconception counseling.  If you may  become pregnant, take 400 to 800 micrograms (mcg) of folic acid every day.  If you want to prevent pregnancy, talk to your health care provider about birth control (contraception). OSTEOPOROSIS AND MENOPAUSE   Osteoporosis is a disease in which the bones lose minerals and strength with aging. This can result in serious bone fractures. Your risk for osteoporosis can be identified using a bone density scan.  If you are 79 years of age or older, or if you are at risk for osteoporosis and fractures, ask your health care provider if you should be screened.  Ask your health care provider whether you should take a calcium or vitamin D supplement to lower your risk for osteoporosis.  Menopause may have certain physical symptoms and risks.  Hormone replacement therapy may reduce some of these symptoms and risks. Talk to your health care provider about whether hormone replacement therapy is right for you.  HOME CARE INSTRUCTIONS   Schedule regular health, dental, and eye exams.  Stay current with your immunizations.   Do not use any tobacco products including cigarettes, chewing tobacco, or electronic cigarettes.  If you are pregnant, do not drink alcohol.  If you are breastfeeding, limit how much and how often you drink alcohol.  Limit alcohol intake to no more than 1 drink per day for nonpregnant women. One drink equals 12 ounces of beer, 5 ounces of wine, or 1 ounces of hard liquor.  Do not use street drugs.  Do not share needles.  Ask your health care provider for help if  you need support or information about quitting drugs.  Tell your health care provider if you often feel depressed.  Tell your health care provider if you have ever been abused or do not feel safe at home.   This information is not intended to replace advice given to you by your health care provider. Make sure you discuss any questions you have with your health care provider.   Document Released: 06/27/2011 Document Revised: 01/02/2015 Document Reviewed: 11/13/2013 Elsevier Interactive Patient Education 2016 [place]. Diabetes and Standards of Medical Care Diabetes is complicated. You may find that your diabetes team includes a dietitian, nurse, diabetes educator, eye doctor, and more. To help everyone know what is going on and to help you get the care you deserve, the following schedule of care was developed to help keep you on track. Below are the tests, exams, vaccines, medicines, education, and plans you will need. HbA1c test This test shows how well you have controlled your glucose over the past 2-3 months. It is used to see if your diabetes management plan needs to be adjusted.   It is performed at least 2 times a year if you are meeting treatment goals.  It is performed 4 times a year if therapy has changed or if you are not meeting treatment goals. Blood pressure test  This test is performed at every routine medical visit. The goal is less than 140/90 mm Hg for most people, but 130/80 mm Hg in some cases. Ask your health care provider about your goal. Dental exam  Follow up with the dentist regularly. Eye exam  If you are diagnosed with type 1 diabetes as a child, get an exam upon reaching the age of 37 years or older and having had diabetes for 3-5 years. Yearly eye exams are recommended after that initial eye exam.  If you are diagnosed with type 1 diabetes as an adult, get an exam within 5 years of  diagnosis and then yearly.  If you are diagnosed with type 2 diabetes, get  an exam as soon as possible after the diagnosis and then yearly. Foot care exam  Visual foot exams are performed at every routine medical visit. The exams check for cuts, injuries, or other problems with the feet.  You should have a complete foot exam performed every year. This exam includes an inspection of the structure and skin of your feet, a check of the pulses in your feet, and a check of the sensation in your feet.  Type 1 diabetes: The first exam is performed 5 years after diagnosis.  Type 2 diabetes: The first exam is performed at the time of diagnosis.  Check your feet nightly for cuts, injuries, or other problems with your feet. Tell your health care provider if anything is not healing. Kidney function test (urine microalbumin)  This test is performed once a year.  Type 1 diabetes: The first test is performed 5 years after diagnosis.  Type 2 diabetes: The first test is performed at the time of diagnosis.  A serum creatinine and estimated glomerular filtration rate (eGFR) test is done once a year to assess the level of chronic kidney disease (CKD), if present. Lipid profile (cholesterol, HDL, LDL, triglycerides)  Performed every 5 years for most people.  The goal for LDL is less than 100 mg/dL. If you are at high risk, the goal is less than 70 mg/dL.  The goal for HDL is 40 mg/dL-50 mg/dL for men and 50 mg/dL-60 mg/dL for women. An HDL cholesterol of 60 mg/dL or higher gives some protection against heart disease.  The goal for triglycerides is less than 150 mg/dL. Immunizations  The flu (influenza) vaccine is recommended yearly for every person 5 months of age or older who has diabetes.  The pneumonia (pneumococcal) vaccine is recommended for every person 39 years of age or older who has diabetes. Adults 75 years of age or older may receive the pneumonia vaccine as a series of two separate shots.  The hepatitis B vaccine is recommended for adults shortly after they have  been diagnosed with diabetes.  The Tdap (tetanus, diphtheria, and pertussis) vaccine should be given:  According to normal childhood vaccination schedules, for children.  Every 10 years, for adults who have diabetes. Diabetes self-management education  Education is recommended at diagnosis and ongoing as needed. Treatment plan  Your treatment plan is reviewed at every medical visit.   This information is not intended to replace advice given to you by your health care provider. Make sure you discuss any questions you have with your health care provider.   Document Released: 10/09/2009 Document Revised: 01/02/2015 Document Reviewed: 05/14/2013 Elsevier Interactive Patient Education 2016 [employer]. Smoking Cessation, Tips for Success If you are ready to quit smoking, congratulations! You have chosen to help yourself be healthier. Cigarettes bring nicotine, tar, carbon monoxide, and other irritants into your body. Your lungs, heart, and blood vessels will be able to work better without these poisons. There are many different ways to quit smoking. Nicotine gum, nicotine patches, a nicotine inhaler, or nicotine nasal spray can help with physical craving. Hypnosis, support groups, and medicines help break the habit of smoking. WHAT THINGS CAN I DO TO MAKE QUITTING EASIER?  Here are some tips to help you quit for good:  Pick a date when you will quit smoking completely. Tell all of your friends and family about your plan to quit on that date.  Do  not try to slowly cut down on the number of cigarettes you are smoking. Pick a quit date and quit smoking completely starting on that day.  Throw away all cigarettes.   Clean and remove all ashtrays from your home, work, and car.  On a card, write down your reasons for quitting. Carry the card with you and read it when you get the urge to smoke.  Cleanse your body of nicotine. Drink enough water and fluids to keep your urine clear or pale  yellow. Do this after quitting to flush the nicotine from your body.  Learn to predict your moods. Do not let a bad situation be your excuse to have a cigarette. Some situations in your life might tempt you into wanting a cigarette.  Never have "just one" cigarette. It leads to wanting another and another. Remind yourself of your decision to quit.  Change habits associated with smoking. If you smoked while driving or when feeling stressed, try other activities to replace smoking. Stand up when drinking your coffee. Brush your teeth after eating. Sit in a different chair when you read the paper. Avoid alcohol while trying to quit, and try to drink fewer caffeinated beverages. Alcohol and caffeine may urge you to smoke.  Avoid foods and drinks that can trigger a desire to smoke, such as sugary or spicy foods and alcohol.  Ask people who smoke not to smoke around you.  Have something planned to do right after eating or having a cup of coffee. For example, plan to take a walk or exercise.  Try a relaxation exercise to calm you down and decrease your stress. Remember, you may be tense and nervous for the first 2 weeks after you quit, but this will pass.  Find new activities to keep your hands busy. Play with a pen, coin, or rubber band. Doodle or draw things on paper.  Brush your teeth right after eating. This will help cut down on the craving for the taste of tobacco after meals. You can also try mouthwash.   Use oral substitutes in place of cigarettes. Try using lemon drops, carrots, cinnamon sticks, or chewing gum. Keep them handy so they are available when you have the urge to smoke.  When you have the urge to smoke, try deep breathing.  Designate your home as a nonsmoking area.  If you are a heavy smoker, ask your health care provider about a prescription for nicotine chewing gum. It can ease your withdrawal from nicotine.  Reward yourself. Set aside the cigarette money you save and buy  yourself something nice.  Look for support from others. Join a support group or smoking cessation program. Ask someone at home or at work to help you with your plan to quit smoking.  Always ask yourself, "Do I need this cigarette or is this just a reflex?" Tell yourself, "Today, I choose not to smoke," or "I do not want to smoke." You are reminding yourself of your decision to quit.  Do not replace cigarette smoking with electronic cigarettes (commonly called e-cigarettes). The safety of e-cigarettes is unknown, and some may contain harmful chemicals.  If you relapse, do not give up! Plan ahead and think about what you will do the next time you get the urge to smoke. HOW WILL I FEEL WHEN I QUIT SMOKING? You may have symptoms of withdrawal because your body is used to nicotine (the addictive substance in cigarettes). You may crave cigarettes, be irritable, feel very hungry, cough often,  get headaches, or have difficulty concentrating. The withdrawal symptoms are only temporary. They are strongest when you first quit but will go away within 10-14 days. When withdrawal symptoms occur, stay in control. Think about your reasons for quitting. Remind yourself that these are signs that your body is healing and getting used to being without cigarettes. Remember that withdrawal symptoms are easier to treat than the major diseases that smoking can cause.  Even after the withdrawal is over, expect periodic urges to smoke. However, these cravings are generally short lived and will go away whether you smoke or not. Do not smoke! WHAT RESOURCES ARE AVAILABLE TO HELP ME QUIT SMOKING? Your health care provider can direct you to community resources or hospitals for support, which may include:  Group support.  Education.  Hypnosis.  Therapy.   This information is not intended to replace advice given to you by your health care provider. Make sure you discuss any questions you have with your health care  provider.   Document Released: 09/09/2004 Document Revised: 01/02/2015 Document Reviewed: 05/30/2013 Elsevier Interactive Patient Education 2016 [place] is the time when your body begins to move into the menopause (no menstrual period for 12 straight months). It is a natural process. Perimenopause can begin 2-8 years before the menopause and usually lasts for 1 year after the menopause. During this time, your ovaries may or may not produce an egg. The ovaries vary in their production of estrogen and progesterone hormones each month. This can cause irregular menstrual periods, difficulty getting pregnant, vaginal bleeding between periods, and uncomfortable symptoms. CAUSES  Irregular production of the ovarian hormones, estrogen and progesterone, and not ovulating every month.  Other causes include:  Tumor of the pituitary gland in the brain.  Medical disease that affects the ovaries.  Radiation treatment.  Chemotherapy.  Unknown causes.  Heavy smoking and excessive alcohol intake can bring on perimenopause sooner. SIGNS AND SYMPTOMS   Hot flashes.  Night sweats.  Irregular menstrual periods.  Decreased sex drive.  Vaginal dryness.  Headaches.  Mood swings.  Depression.  Memory problems.  Irritability.  Tiredness.  Weight gain.  Trouble getting pregnant.  The beginning of losing bone cells (osteoporosis).  The beginning of hardening of the arteries (atherosclerosis). DIAGNOSIS  Your health care provider will make a diagnosis by analyzing your age, menstrual history, and symptoms. He or she will do a physical exam and note any changes in your body, especially your female organs. Female hormone tests may or may not be helpful depending on the amount of female hormones you produce and when you produce them. However, other hormone tests may be helpful to rule out other problems. TREATMENT  In some cases, no treatment is needed. The  decision on whether treatment is necessary during the perimenopause should be made by you and your health care provider based on how the symptoms are affecting you and your lifestyle. Various treatments are available, such as:  Treating individual symptoms with a specific medicine for that symptom.  Herbal medicines that can help specific symptoms.  Counseling.  Group therapy. HOME CARE INSTRUCTIONS   Keep track of your menstrual periods (when they occur, how heavy they are, how long between periods, and how long they last) as well as your symptoms and when they started.  Only take over-the-counter or prescription medicines as directed by your health care provider.  Sleep and rest.  Exercise.  Eat a diet that contains calcium (good for your bones) and soy (  acts like the estrogen hormone).  Do not smoke.  Avoid alcoholic beverages.  Take vitamin supplements as recommended by your health care provider. Taking vitamin E may help in certain cases.  Take calcium and vitamin D supplements to help prevent bone loss.  Group therapy is sometimes helpful.  Acupuncture may help in some cases. SEEK MEDICAL CARE IF:   You have questions about any symptoms you are having.  You need a referral to a specialist (gynecologist, psychiatrist, or psychologist). SEEK IMMEDIATE MEDICAL CARE IF:   You have vaginal bleeding.  Your period lasts longer than 8 days.  Your periods are recurring sooner than 21 days.  You have bleeding after intercourse.  You have severe depression.  You have pain when you urinate.  You have severe headaches.  You have vision problems.   This information is not intended to replace advice given to you by your health care provider. Make sure you discuss any questions you have with your health care provider.   Document Released: 01/19/2005 Document Revised: 01/02/2015 Document Reviewed: 07/11/2013 Elsevier Interactive Patient Education [employer].

## 2016-08-22 NOTE — Progress Notes (Signed)
BP 120/86 (BP Location: Left Arm, Patient Position: Sitting, Cuff Size: Large)   Pulse (!) 110   Temp 98.9 F (37.2 C)   Ht 5' 4.6" (1.641 m)   Wt 237 lb (107.5 kg)   LMP  (LMP Unknown)   SpO2 98%   BMI 39.93 kg/m    Subjective:    Patient ID: Amanda Moran, female    DOB: 31-Jan-1971, 45 y.o.   MRN: [id number]  HPI: Amanda Moran is a 44 y.o. female presenting on 08/22/2016 for comprehensive medical examination. Current medical complaints include:  UPPER RESPIRATORY TRACT INFECTION Duration: 2 days Worst symptom: sore throat Fever: no Cough: yes Shortness of breath: no Wheezing: no Chest pain: no Chest tightness: no Chest congestion: no Nasal congestion: no Runny nose: no Post nasal drip: no Sneezing: no Sore throat: yes Swollen glands: no Sinus pressure: no Headache: no Face pain: no Toothache: no Ear pain: no  Ear pressure: no  Eyes red/itching:no Eye drainage/crusting: no  Vomiting: no Rash: no Fatigue: no Sick contacts: yes- grandson Strep contacts: no  Context: worse Recurrent sinusitis: no Relief with OTC cold/cough medications: no  Treatments attempted: none  Slipped and fell at [employer] on Saturday and has been unable to put pressure on it.   She currently lives with: family Menopausal Symptoms: yes- night sweats  Depression Screen done today and results listed below:  Depression screen [facility] 2/9 08/22/2016  Decreased Interest 0  Down, Depressed, Hopeless 0  PHQ - 2 Score 0    Past Medical History:  Past Medical History:  Diagnosis Date  . Acanthosis nigricans   . Anemia   . Diabetes mellitus without complication ([place])   . Fibroids   . Fibroids   . Headache   . Iron deficiency anemia     Surgical History:  Past Surgical History:  Procedure Laterality Date  . ABDOMINAL HYSTERECTOMY  05/29/2015   Procedure: HYSTERECTOMY ABDOMINAL;  Surgeon: Ruffin Frederick, MD;  Location: [facility] ORS;  Service: Gynecology;;  . Lorin Mercy     . KNEE ARTHROSCOPY Right   . LAPAROSCOPIC SUPRACERVICAL HYSTERECTOMY N/A 05/29/2015   Procedure: LAPAROSCOPIC SUPRACERVICAL HYSTERECTOMY;  Surgeon: Ruffin Frederick, MD;  Location: [facility] ORS;  Service: Gynecology;  Laterality: N/A;  attempted  . TUBAL LIGATION      Medications:  Current Outpatient Prescriptions on File Prior to Visit  Medication Sig  . ibuprofen (ADVIL,MOTRIN) 600 MG tablet TAKE 1 TABLET (600 MG TOTAL) BY MOUTH EVERY 8 (EIGHT) HOURS AS NEEDED.  Marland Kitchen oxyCODONE-acetaminophen (PERCOCET) 7.5-325 MG tablet Take 1 tablet by mouth every 6 (six) hours as needed for severe pain.   No current facility-administered medications on file prior to visit.     Allergies:  No Known Allergies  Social History:  Social History   Social History  . Marital status: Married    Spouse name: N/A  . Number of children: N/A  . Years of education: N/A   Occupational History  . Not on file.   Social History Main Topics  . Smoking status: Current Every Day Smoker    Packs/day: 1.00    Types: Cigarettes  . Smokeless tobacco: Never Used  . Alcohol use Yes     Comment: socially  . Drug use: No  . Sexual activity: Yes   Other Topics Concern  . Not on file   Social History Narrative  . No narrative on file   History  Smoking Status  . Current Every Day Smoker  .  Packs/day: 1.00  . Types: Cigarettes  Smokeless Tobacco  . Never Used   History  Alcohol Use  . Yes    Comment: socially    Family History: -* Family History  Problem Relation Age of Onset  . Alcohol abuse Father   . Arthritis Father   . Diabetes Father   . Drug abuse Father   . Cancer Father     pancreatic  . Diabetes Brother   . Cancer Mother     cervical    Past medical history, surgical history, medications, allergies, family history and social history reviewed with patient today and changes made to appropriate areas of the chart.   Review of Systems  Constitutional: Negative.   HENT: Positive  for sore throat. Negative for congestion, ear discharge, ear pain, hearing loss, nosebleeds and tinnitus.   Eyes: Negative.   Respiratory: Positive for cough (3 months). Negative for hemoptysis, sputum production, shortness of breath, wheezing and stridor.   Cardiovascular: Negative.   Gastrointestinal: Positive for diarrhea (last week, gone now). Negative for abdominal pain, blood in stool, constipation, heartburn, melena, nausea and vomiting.  Genitourinary: Negative.   Musculoskeletal: Positive for falls and joint pain. Negative for back pain, myalgias and neck pain.  Skin: Negative.   Neurological: Negative.  Negative for headaches.  Endo/Heme/Allergies: Positive for polydipsia. Negative for environmental allergies. Does not bruise/bleed easily.  Psychiatric/Behavioral: Negative.     All other ROS negative except what is listed above and in the HPI.      Objective:    BP 120/86 (BP Location: Left Arm, Patient Position: Sitting, Cuff Size: Large)   Pulse (!) 110   Temp 98.9 F (37.2 C)   Ht 5' 4.6" (1.641 m)   Wt 237 lb (107.5 kg)   LMP  (LMP Unknown)   SpO2 98%   BMI 39.93 kg/m   Wt Readings from Last 3 Encounters:  08/22/16 237 lb (107.5 kg)  08/20/16 235 lb (106.6 kg)  07/07/16 237 lb (107.5 kg)    Physical Exam  Constitutional: She is oriented to person, place, and time. She appears well-developed and well-nourished. No distress.  HENT:  Head: Normocephalic and atraumatic.  Right Ear: Hearing, tympanic membrane, external ear and ear canal normal.  Left Ear: Hearing, tympanic membrane, external ear and ear canal normal.  Nose: Mucosal edema and rhinorrhea present.  Mouth/Throat: Uvula is midline and mucous membranes are normal. Oropharyngeal exudate, posterior oropharyngeal edema and posterior oropharyngeal erythema present.  2+ tonsils with white exudate on the L  Eyes: Conjunctivae, EOM and lids are normal. Pupils are equal, round, and reactive to light. Right eye  exhibits no discharge. Left eye exhibits no discharge. No scleral icterus.  Neck: Normal range of motion. Neck supple. No JVD present. No tracheal deviation present. No thyromegaly present.  Cardiovascular: Normal rate, regular rhythm, normal heart sounds and intact distal pulses.  Exam reveals no gallop and no friction rub.   No murmur heard. Pulmonary/Chest: Effort normal and breath sounds normal. No stridor. No respiratory distress. She has no wheezes. She has no rales. She exhibits no tenderness. Right breast exhibits no inverted nipple, no mass, no nipple discharge, no skin change and no tenderness. Left breast exhibits no inverted nipple, no mass, no nipple discharge, no skin change and no tenderness. Breasts are symmetrical.  Abdominal: Soft. Bowel sounds are normal. She exhibits no distension and no mass. There is no tenderness. There is no rebound and no guarding.  Genitourinary:  Genitourinary Comments: Deferred  with shared decision making.  Musculoskeletal: Normal range of motion. She exhibits no edema, tenderness or deformity.  Lymphadenopathy:    She has cervical adenopathy.  Neurological: She is alert and oriented to person, place, and time. She has normal reflexes. She displays normal reflexes. No cranial nerve deficit. She exhibits normal muscle tone. Coordination normal.  Skin: Skin is warm, dry and intact. No rash noted. She is not diaphoretic. No erythema. No pallor.  Psychiatric: She has a normal mood and affect. Her speech is normal and behavior is normal. Judgment and thought content normal. Cognition and memory are normal.  Nursing note and vitals reviewed.   Results for orders placed or performed in visit on 08/22/16  Microalbumin, urine  Result Value Ref Range   Microalb, Ur ^30   Hemoglobin A1c  Result Value Ref Range   Hemoglobin A1C 5.7       Assessment & Plan:   Problem List Items Addressed This Visit      Endocrine   IFG (impaired fasting glucose)    A1c  5.7. Continue current regimen. Continue to monitor.         Other   Iron deficiency anemia due to chronic blood loss    Improved since hysterectomy. Checking labs today. Await results.       Relevant Orders   CBC with Differential/Platelet    Other Visit Diagnoses    Routine general medical examination at a health care facility    -  Primary   Recommeneded pneumovax and flu. Tdap up to date. Screening labs checked today. Mammogram ordered. Pap N/A. Continue diet and exercise.    Relevant Orders   Bayer DCA Hb A1c Waived   CBC with Differential/Platelet   Comprehensive metabolic panel   Lipid Panel Piccolo, Waived   Microalbumin, Urine Waived   TSH   UA/M w/rflx Culture, Routine   Screening for breast cancer       Mammogram ordered today.   Relevant Orders   MM DIGITAL SCREENING BILATERAL   Sore throat       + strep.   Relevant Orders   Rapid strep screen (not at [facility])   Strep pharyngitis       Will treat with amoxicillin. Call if not getting better or getting worse.        Follow up plan: Return in about 6 months (around 02/22/2017).   LABORATORY TESTING:  - Pap smear: not applicable  IMMUNIZATIONS:   - Tdap: Tetanus vaccination status reviewed: last tetanus booster within 10 years. - Influenza: Postponed to flu season - Pneumovax: Refused  SCREENING: -Mammogram: Ordered today  - Colonoscopy: Not applicable   PATIENT COUNSELING:   Advised to take 1 mg of folate supplement per day if capable of pregnancy.   Sexuality: Discussed sexually transmitted diseases, partner selection, use of condoms, avoidance of unintended pregnancy  and contraceptive alternatives.   Advised to avoid cigarette smoking.  I discussed with the patient that most people either abstain from alcohol or drink within safe limits (<=14/week and <=4 drinks/occasion for males, <=7/weeks and <= 3 drinks/occasion for females) and that the risk for alcohol disorders and other health effects rises  proportionally with the number of drinks per week and how often a drinker exceeds daily limits.  Discussed cessation/primary prevention of drug use and availability of treatment for abuse.   Diet: Encouraged to adjust caloric intake to maintain  or achieve ideal body weight, to reduce intake of dietary saturated fat and total fat, to  limit sodium intake by avoiding high sodium foods and not adding table salt, and to maintain adequate dietary potassium and calcium preferably from fresh fruits, vegetables, and low-fat dairy products.    stressed the importance of regular exercise  Injury prevention: Discussed safety belts, safety helmets, smoke detector, smoking near bedding or upholstery.   Dental health: Discussed importance of regular tooth brushing, flossing, and dental visits.    NEXT PREVENTATIVE PHYSICAL DUE IN 1 YEAR. Return in about 6 months (around 02/22/2017).

## 2016-08-23 ENCOUNTER — Encounter: Payer: Self-pay | Admitting: Family Medicine

## 2016-08-23 LAB — CBC WITH DIFFERENTIAL/PLATELET
BASOS: 0 %
Basophils Absolute: 0 10*3/uL (ref 0.0–0.2)
EOS (ABSOLUTE): 0.1 10*3/uL (ref 0.0–0.4)
EOS: 1 %
HEMATOCRIT: 40.5 % (ref 34.0–46.6)
Hemoglobin: 13.5 g/dL (ref 11.1–15.9)
Immature Grans (Abs): 0 10*3/uL (ref 0.0–0.1)
Immature Granulocytes: 0 %
LYMPHS ABS: 2.1 10*3/uL (ref 0.7–3.1)
Lymphs: 15 %
MCH: 29.2 pg (ref 26.6–33.0)
MCHC: 33.3 g/dL (ref 31.5–35.7)
MCV: 88 fL (ref 79–97)
MONOS ABS: 1 10*3/uL — AB (ref 0.1–0.9)
Monocytes: 7 %
NEUTROS PCT: 77 %
Neutrophils Absolute: 11.2 10*3/uL — ABNORMAL HIGH (ref 1.4–7.0)
PLATELETS: 229 10*3/uL (ref 150–379)
RBC: 4.63 x10E6/uL (ref 3.77–5.28)
RDW: 13.9 % (ref 12.3–15.4)
WBC: 14.5 10*3/uL — ABNORMAL HIGH (ref 3.4–10.8)

## 2016-08-23 LAB — COMPREHENSIVE METABOLIC PANEL
A/G RATIO: 1.4 (ref 1.2–2.2)
ALK PHOS: 74 IU/L (ref 39–117)
ALT: 18 IU/L (ref 0–32)
AST: 20 IU/L (ref 0–40)
Albumin: 4.2 g/dL (ref 3.5–5.5)
BILIRUBIN TOTAL: 0.6 mg/dL (ref 0.0–1.2)
BUN/Creatinine Ratio: 12 (ref 9–23)
BUN: 8 mg/dL (ref 6–24)
CALCIUM: 9.3 mg/dL (ref 8.7–10.2)
CHLORIDE: 100 mmol/L (ref 96–106)
CO2: 22 mmol/L (ref 18–29)
Creatinine, Ser: 0.66 mg/dL (ref 0.57–1.00)
GFR calc Af Amer: 125 mL/min/{1.73_m2} (ref >59)
GFR, EST NON AFRICAN AMERICAN: 109 mL/min/{1.73_m2} (ref >59)
GLOBULIN, TOTAL: 3.1 g/dL (ref 1.5–4.5)
Glucose: 129 mg/dL — ABNORMAL HIGH (ref 65–99)
POTASSIUM: 3.5 mmol/L (ref 3.5–5.2)
SODIUM: 140 mmol/L (ref 134–144)
Total Protein: 7.3 g/dL (ref 6.0–8.5)

## 2016-08-23 LAB — TSH: TSH: 1.07 u[IU]/mL (ref 0.450–4.500)

## 2016-08-24 LAB — MICROSCOPIC EXAMINATION

## 2016-08-24 LAB — UA/M W/RFLX CULTURE, ROUTINE
BILIRUBIN UA: NEGATIVE
GLUCOSE, UA: NEGATIVE
Ketones, UA: NEGATIVE
NITRITE UA: NEGATIVE
UUROB: 0.2 mg/dL (ref 0.2–1.0)
pH, UA: 5.5 (ref 5.0–7.5)

## 2016-08-24 LAB — LIPID PANEL PICCOLO, WAIVED
CHOLESTEROL PICCOLO, WAIVED: 203 mg/dL — AB (ref <200)
Chol/HDL Ratio Piccolo,Waive: 4.1 mg/dL
HDL CHOL PICCOLO, WAIVED: 49 mg/dL — AB (ref >59)
LDL Chol Calc Piccolo Waived: 137 mg/dL — ABNORMAL HIGH (ref <100)
TRIGLYCERIDES PICCOLO,WAIVED: 85 mg/dL (ref <150)
VLDL CHOL CALC PICCOLO,WAIVE: 17 mg/dL (ref <30)

## 2016-08-24 LAB — URINE CULTURE, REFLEX

## 2016-08-24 LAB — BAYER DCA HB A1C WAIVED: HB A1C (BAYER DCA - WAIVED): 5.7 % (ref <7.0)

## 2016-08-24 LAB — MICROALBUMIN, URINE WAIVED
Creatinine, Urine Waived: 300 mg/dL (ref 10–300)
Microalb, Ur Waived: 80 mg/L — ABNORMAL HIGH (ref 0–19)

## 2016-08-25 ENCOUNTER — Telehealth: Payer: Self-pay | Admitting: Family Medicine

## 2016-08-25 ENCOUNTER — Encounter: Payer: Self-pay | Admitting: Family Medicine

## 2016-08-25 NOTE — Telephone Encounter (Signed)
Pt called and stated that her knee was swollen and she has to work the weekend and would like to get a note to excuse her from work until her appt Tuesday. Per Apolonio Schneiders I have printed a note to excuse her from work until Tuesday.

## 2016-08-25 NOTE — Telephone Encounter (Signed)
Thanks

## 2016-08-30 ENCOUNTER — Ambulatory Visit (INDEPENDENT_AMBULATORY_CARE_PROVIDER_SITE_OTHER): Payer: Commercial Managed Care - PPO | Admitting: Family Medicine

## 2016-08-30 ENCOUNTER — Encounter: Payer: Self-pay | Admitting: Family Medicine

## 2016-08-30 VITALS — BP 112/78 | HR 73 | Temp 98.4°F | Wt 238.0 lb

## 2016-08-30 DIAGNOSIS — M25462 Effusion, left knee: Secondary | ICD-10-CM

## 2016-08-30 MED ORDER — PREDNISONE 50 MG PO TABS: 50.0000 mg | ORAL_TABLET | Freq: Every day | ORAL | 0 refills | Status: DC

## 2016-08-30 NOTE — Progress Notes (Signed)
BP 112/78 (BP Location: Left Arm, Patient Position: Sitting, Cuff Size: Large)   Pulse 73   Temp 98.4 F (36.9 C)   Wt 238 lb (108 kg)   LMP  (LMP Unknown)   SpO2 97%   BMI 40.10 kg/m    Subjective:    Patient ID: Amanda Moran, female    DOB: 1971-05-18, 45 y.o.   MRN: [id number]  HPI: Amanda Moran is a 44 y.o. female  Chief Complaint  Patient presents with  . Knee Pain    Left   ER FOLLOW UP Time since discharge: 9 days Hospital/facility: [facility] Diagnosis: L knee sprain Procedures/tests: X-ray- normal except baker's cyst Consultants: None New medications: percocet and ibuprofen Discharge instructions:  Follow up here Status: better  KNEE PAIN- has not been using wrap or the crutches, slipped in [employer] and her feet went out from under her Duration: 9 days Involved knee: left Mechanism of injury: trauma Location:anterior Onset: sudden Severity: 7/10 sharp pain, dull throb at night 5/10 Quality:  Sharp at times, other times dull throb Frequency: constant Radiation: no Aggravating factors: weight bearing, walking, running, stairs and bending  Alleviating factors: pain medicine, ice and NSAIDs  Status: better Treatments attempted: pain medicine, rest, ice, APAP, ibuprofen and aleve  Relief with NSAIDs?:  mild Weakness with weight bearing or walking: no Sensation of giving way: yes Locking: no Popping: yes Bruising: no Swelling: yes Redness: no Paresthesias/decreased sensation: no Fevers: no  Relevant past medical, surgical, family and social history reviewed and updated as indicated. Interim medical history since our last visit reviewed. Allergies and medications reviewed and updated.  Review of Systems  Constitutional: Negative.   Respiratory: Negative.   Cardiovascular: Negative.   Musculoskeletal: Positive for arthralgias, gait problem and joint swelling. Negative for back pain, myalgias, neck pain and neck stiffness.  Psychiatric/Behavioral:  Negative.     Per HPI unless specifically indicated above     Objective:    BP 112/78 (BP Location: Left Arm, Patient Position: Sitting, Cuff Size: Large)   Pulse 73   Temp 98.4 F (36.9 C)   Wt 238 lb (108 kg)   LMP  (LMP Unknown)   SpO2 97%   BMI 40.10 kg/m   Wt Readings from Last 3 Encounters:  08/30/16 238 lb (108 kg)  08/22/16 237 lb (107.5 kg)  08/20/16 235 lb (106.6 kg)    Physical Exam  Constitutional: She is oriented to person, place, and time. She appears well-developed and well-nourished. No distress.  HENT:  Head: Normocephalic and atraumatic.  Right Ear: Hearing normal.  Left Ear: Hearing normal.  Nose: Nose normal.  Eyes: Conjunctivae and lids are normal. Right eye exhibits no discharge. Left eye exhibits no discharge. No scleral icterus.  Pulmonary/Chest: Effort normal. No respiratory distress.  Musculoskeletal: She exhibits edema and tenderness. She exhibits no deformity.  Decreased flexion and extension. Loose anterior drawer test, Negative McMurrays, negative Appley's compression and distraction, Negative posterior drawer test, + effusion to the knee, tenderness along the joint line, especially medially  Neurological: She is alert and oriented to person, place, and time.  Skin: Skin is warm, dry and intact. No rash noted. No erythema. No pallor.  Psychiatric: She has a normal mood and affect. Her speech is normal and behavior is normal. Judgment and thought content normal. Cognition and memory are normal.  Nursing note and vitals reviewed.   Results for orders placed or performed in visit on 08/22/16  Rapid strep screen (not  at [facility])  Result Value Ref Range   Strep Gp A Ag, IA W/Reflex Positive (A) Negative  Microscopic Examination  Result Value Ref Range   WBC, UA 0-5 0 - 5 /hpf   RBC, UA 0-2 0 - 2 /hpf   Epithelial Cells (non renal) 0-10 0 - 10 /hpf   Mucus, UA Present Not Estab.   Bacteria, UA Few None seen/Few  Bayer DCA Hb A1c Waived  Result  Value Ref Range   Bayer DCA Hb A1c Waived 5.7 <7.0 %  CBC with Differential/Platelet  Result Value Ref Range   WBC 14.5 (H) 3.4 - 10.8 x10E3/uL   RBC 4.63 3.77 - 5.28 x10E6/uL   Hemoglobin 13.5 11.1 - 15.9 g/dL   Hematocrit 40.5 34.0 - 46.6 %   MCV 88 79 - 97 fL   MCH 29.2 26.6 - 33.0 pg   MCHC 33.3 31.5 - 35.7 g/dL   RDW 13.9 12.3 - 15.4 %   Platelets 229 150 - 379 x10E3/uL   Neutrophils 77 %   Lymphs 15 %   Monocytes 7 %   Eos 1 %   Basos 0 %   Neutrophils Absolute 11.2 (H) 1.4 - 7.0 x10E3/uL   Lymphocytes Absolute 2.1 0.7 - 3.1 x10E3/uL   Monocytes Absolute 1.0 (H) 0.1 - 0.9 x10E3/uL   EOS (ABSOLUTE) 0.1 0.0 - 0.4 x10E3/uL   Basophils Absolute 0.0 0.0 - 0.2 x10E3/uL   Immature Granulocytes 0 %   Immature Grans (Abs) 0.0 0.0 - 0.1 x10E3/uL  Comprehensive metabolic panel  Result Value Ref Range   Glucose 129 (H) 65 - 99 mg/dL   BUN 8 6 - 24 mg/dL   Creatinine, Ser 0.66 0.57 - 1.00 mg/dL   GFR calc non Af Amer 109 >59 mL/min/1.73   GFR calc Af Amer 125 >59 mL/min/1.73   BUN/Creatinine Ratio 12 9 - 23   Sodium 140 134 - 144 mmol/L   Potassium 3.5 3.5 - 5.2 mmol/L   Chloride 100 96 - 106 mmol/L   CO2 22 18 - 29 mmol/L   Calcium 9.3 8.7 - 10.2 mg/dL   Total Protein 7.3 6.0 - 8.5 g/dL   Albumin 4.2 3.5 - 5.5 g/dL   Globulin, Total 3.1 1.5 - 4.5 g/dL   Albumin/Globulin Ratio 1.4 1.2 - 2.2   Bilirubin Total 0.6 0.0 - 1.2 mg/dL   Alkaline Phosphatase 74 39 - 117 IU/L   AST 20 0 - 40 IU/L   ALT 18 0 - 32 IU/L  Lipid Panel Piccolo, Waived  Result Value Ref Range   Cholesterol Piccolo, Waived 203 (H) <200 mg/dL   HDL Chol Piccolo, Waived 49 (L) >59 mg/dL   Triglycerides Piccolo,Waived 85 <150 mg/dL   Chol/HDL Ratio Piccolo,Waive 4.1 mg/dL   LDL Chol Calc Piccolo Waived 137 (H) <100 mg/dL   VLDL Chol Calc Piccolo,Waive 17 <30 mg/dL  Microalbumin, Urine Waived  Result Value Ref Range   Microalb, Ur Waived 80 (H) 0 - 19 mg/L   Creatinine, Urine Waived 300 10 - 300 mg/dL    Microalb/Creat Ratio 30-300 (H) <30 mg/g  TSH  Result Value Ref Range   TSH 1.070 0.450 - 4.500 uIU/mL  UA/M w/rflx Culture, Routine  Result Value Ref Range   Specific Gravity, UA >1.030 (H) 1.005 - 1.030   pH, UA 5.5 5.0 - 7.5   Color, UA Yellow Yellow   Appearance Ur Clear Clear   Leukocytes, UA Trace (A) Negative   Protein, UA 1+ (  A) Negative/Trace   Glucose, UA Negative Negative   Ketones, UA Negative Negative   RBC, UA 2+ (A) Negative   Bilirubin, UA Negative Negative   Urobilinogen, Ur 0.2 0.2 - 1.0 mg/dL   Nitrite, UA Negative Negative   Microscopic Examination See below:    Urinalysis Reflex Comment   Microalbumin, urine  Result Value Ref Range   Microalb, Ur ^30   Hemoglobin A1c  Result Value Ref Range   Hemoglobin A1C 5.7   Urine Culture, Routine  Result Value Ref Range   Urine Culture, Routine Final report    Urine Culture result 1 Comment       Assessment & Plan:   Problem List Items Addressed This Visit    None    Visit Diagnoses    Effusion of left knee joint    -  Primary   Advised wrap and crutches. Prednisone for 5 days, then back on ibuprofen, Concern for possible tear, if not better in 7-10 days, will obtain MRI.       Follow up plan: Return 7-10 days.

## 2016-10-11 ENCOUNTER — Telehealth: Payer: Self-pay | Admitting: Family Medicine

## 2016-10-11 MED ORDER — IBUPROFEN 600 MG PO TABS: ORAL_TABLET | ORAL | 4 refills | Status: DC

## 2016-10-11 NOTE — Telephone Encounter (Signed)
Rx sent to her pharmacy 

## 2016-10-11 NOTE — Telephone Encounter (Signed)
Pt called saying her right knee was giving out again she made an appt to see Dr. Wynetta Emery Friday but would like to know if Dr. Wynetta Emery could call her 800 mg of ibuprofen to hold her over because she is out.

## 2016-10-14 ENCOUNTER — Ambulatory Visit (INDEPENDENT_AMBULATORY_CARE_PROVIDER_SITE_OTHER): Payer: Commercial Managed Care - PPO | Admitting: Family Medicine

## 2016-10-14 ENCOUNTER — Encounter: Payer: Self-pay | Admitting: Family Medicine

## 2016-10-14 VITALS — BP 117/79 | HR 85 | Temp 99.0°F | Wt 241.1 lb

## 2016-10-14 DIAGNOSIS — M25461 Effusion, right knee: Secondary | ICD-10-CM

## 2016-10-14 DIAGNOSIS — Z23 Encounter for immunization: Secondary | ICD-10-CM | POA: Diagnosis not present

## 2016-10-14 MED ORDER — HYDROCODONE-ACETAMINOPHEN 10-325 MG PO TABS: 1.0000 | ORAL_TABLET | Freq: Three times a day (TID) | ORAL | 0 refills | Status: DC | PRN

## 2016-10-14 NOTE — Patient Instructions (Signed)

## 2016-10-14 NOTE — Progress Notes (Signed)
BP 117/79 (BP Location: Left Arm, Patient Position: Sitting, Cuff Size: Large)   Pulse 85   Temp 99 F (37.2 C)   Wt 241 lb 1.6 oz (109.4 kg)   LMP  (LMP Unknown)   SpO2 97%   BMI 40.62 kg/m    Subjective:    Patient ID: Amanda Moran, female    DOB: 03-14-1971, 45 y.o.   MRN: [id number]  HPI: Amanda Moran is a 45 y.o. female  Chief Complaint  Patient presents with  . Knee Pain    patient's knee gave out the other day and she fell, now her right knee is hurting   Knee that she hurt at [employer] is better.  KNEE PAIN- felt better after steroid shot for about a month, then started to feel worse again, Tuesday was walking at work and it gave out on her and she fell.  Duration: chronic- has had 2 arthroscopic surgeries on that knee previously. Now just too frustrated.  Involved knee: right Mechanism of injury: unknown Location:diffuse Onset: sudden Severity: moderate  Quality:  throbbing Frequency: constant Radiation: no Aggravating factors: weight bearing, walking, running, stairs and movement  Alleviating factors: NSAIDs  Status: worse Treatments attempted: rest, ice, heat and ibuprofen  Relief with NSAIDs?:  mild Weakness with weight bearing or walking: yes Sensation of giving way: yes Locking: yes Popping: yes Bruising: no Swelling: yes Redness: no Paresthesias/decreased sensation: no Fevers: no  Relevant past medical, surgical, family and social history reviewed and updated as indicated. Interim medical history since our last visit reviewed. Allergies and medications reviewed and updated.  Review of Systems  Constitutional: Negative.   Respiratory: Negative.   Cardiovascular: Negative.   Musculoskeletal: Positive for arthralgias, gait problem and joint swelling. Negative for back pain, myalgias, neck pain and neck stiffness.  Psychiatric/Behavioral: Negative.     Per HPI unless specifically indicated above     Objective:    BP 117/79 (BP  Location: Left Arm, Patient Position: Sitting, Cuff Size: Large)   Pulse 85   Temp 99 F (37.2 C)   Wt 241 lb 1.6 oz (109.4 kg)   LMP  (LMP Unknown)   SpO2 97%   BMI 40.62 kg/m   Wt Readings from Last 3 Encounters:  10/14/16 241 lb 1.6 oz (109.4 kg)  08/30/16 238 lb (108 kg)  08/22/16 237 lb (107.5 kg)    Physical Exam  Constitutional: She is oriented to person, place, and time. She appears well-developed and well-nourished. No distress.  HENT:  Head: Normocephalic and atraumatic.  Right Ear: Hearing normal.  Left Ear: Hearing normal.  Nose: Nose normal.  Eyes: Conjunctivae and lids are normal. Right eye exhibits no discharge. Left eye exhibits no discharge. No scleral icterus.  Pulmonary/Chest: Effort normal. No respiratory distress.  Musculoskeletal: She exhibits edema (effusion of R Knee joint) and tenderness (tenderness along the joint line). She exhibits no deformity.  Decreased ROM   Neurological: She is alert and oriented to person, place, and time.  Skin: Skin is warm, dry and intact. No rash noted. She is not diaphoretic. No erythema. No pallor.  Psychiatric: She has a normal mood and affect. Her speech is normal and behavior is normal. Judgment and thought content normal. Cognition and memory are normal.    Results for orders placed or performed in visit on 08/22/16  Rapid strep screen (not at [facility])  Result Value Ref Range   Strep Gp A Ag, IA W/Reflex Positive (A) Negative  Microscopic  Examination  Result Value Ref Range   WBC, UA 0-5 0 - 5 /hpf   RBC, UA 0-2 0 - 2 /hpf   Epithelial Cells (non renal) 0-10 0 - 10 /hpf   Mucus, UA Present Not Estab.   Bacteria, UA Few None seen/Few  Bayer DCA Hb A1c Waived  Result Value Ref Range   Bayer DCA Hb A1c Waived 5.7 <7.0 %  CBC with Differential/Platelet  Result Value Ref Range   WBC 14.5 (H) 3.4 - 10.8 x10E3/uL   RBC 4.63 3.77 - 5.28 x10E6/uL   Hemoglobin 13.5 11.1 - 15.9 g/dL   Hematocrit 40.5 34.0 - 46.6 %   MCV  88 79 - 97 fL   MCH 29.2 26.6 - 33.0 pg   MCHC 33.3 31.5 - 35.7 g/dL   RDW 13.9 12.3 - 15.4 %   Platelets 229 150 - 379 x10E3/uL   Neutrophils 77 %   Lymphs 15 %   Monocytes 7 %   Eos 1 %   Basos 0 %   Neutrophils Absolute 11.2 (H) 1.4 - 7.0 x10E3/uL   Lymphocytes Absolute 2.1 0.7 - 3.1 x10E3/uL   Monocytes Absolute 1.0 (H) 0.1 - 0.9 x10E3/uL   EOS (ABSOLUTE) 0.1 0.0 - 0.4 x10E3/uL   Basophils Absolute 0.0 0.0 - 0.2 x10E3/uL   Immature Granulocytes 0 %   Immature Grans (Abs) 0.0 0.0 - 0.1 x10E3/uL  Comprehensive metabolic panel  Result Value Ref Range   Glucose 129 (H) 65 - 99 mg/dL   BUN 8 6 - 24 mg/dL   Creatinine, Ser 0.66 0.57 - 1.00 mg/dL   GFR calc non Af Amer 109 >59 mL/min/1.73   GFR calc Af Amer 125 >59 mL/min/1.73   BUN/Creatinine Ratio 12 9 - 23   Sodium 140 134 - 144 mmol/L   Potassium 3.5 3.5 - 5.2 mmol/L   Chloride 100 96 - 106 mmol/L   CO2 22 18 - 29 mmol/L   Calcium 9.3 8.7 - 10.2 mg/dL   Total Protein 7.3 6.0 - 8.5 g/dL   Albumin 4.2 3.5 - 5.5 g/dL   Globulin, Total 3.1 1.5 - 4.5 g/dL   Albumin/Globulin Ratio 1.4 1.2 - 2.2   Bilirubin Total 0.6 0.0 - 1.2 mg/dL   Alkaline Phosphatase 74 39 - 117 IU/L   AST 20 0 - 40 IU/L   ALT 18 0 - 32 IU/L  Lipid Panel Piccolo, Waived  Result Value Ref Range   Cholesterol Piccolo, Waived 203 (H) <200 mg/dL   HDL Chol Piccolo, Waived 49 (L) >59 mg/dL   Triglycerides Piccolo,Waived 85 <150 mg/dL   Chol/HDL Ratio Piccolo,Waive 4.1 mg/dL   LDL Chol Calc Piccolo Waived 137 (H) <100 mg/dL   VLDL Chol Calc Piccolo,Waive 17 <30 mg/dL  Microalbumin, Urine Waived  Result Value Ref Range   Microalb, Ur Waived 80 (H) 0 - 19 mg/L   Creatinine, Urine Waived 300 10 - 300 mg/dL   Microalb/Creat Ratio 30-300 (H) <30 mg/g  TSH  Result Value Ref Range   TSH 1.070 0.450 - 4.500 uIU/mL  UA/M w/rflx Culture, Routine  Result Value Ref Range   Specific Gravity, UA >1.030 (H) 1.005 - 1.030   pH, UA 5.5 5.0 - 7.5   Color, UA Yellow  Yellow   Appearance Ur Clear Clear   Leukocytes, UA Trace (A) Negative   Protein, UA 1+ (A) Negative/Trace   Glucose, UA Negative Negative   Ketones, UA Negative Negative   RBC, UA 2+ (A)  Negative   Bilirubin, UA Negative Negative   Urobilinogen, Ur 0.2 0.2 - 1.0 mg/dL   Nitrite, UA Negative Negative   Microscopic Examination See below:    Urinalysis Reflex Comment   Microalbumin, urine  Result Value Ref Range   Microalb, Ur ^30   Hemoglobin A1c  Result Value Ref Range   Hemoglobin A1C 5.7   Urine Culture, Routine  Result Value Ref Range   Urine Culture, Routine Final report    Urine Culture result 1 Comment       Assessment & Plan:   Problem List Items Addressed This Visit    None    Visit Diagnoses    Effusion of right knee    -  Primary   Would like to see orthopedics again. Referral generated today. Continue ibuprofen. To take vicodin at night if needed. Not to drive on it. Call with concerns.    Relevant Orders   Ambulatory referral to Orthopedic Surgery   Immunization due       Flu shot given toady.   Relevant Orders   Flu Vaccine QUAD 36+ mos PF IM (Fluarix & Fluzone Quad PF) (Completed)       Follow up plan: Return if symptoms worsen or fail to improve.

## 2016-12-27 ENCOUNTER — Encounter: Payer: Self-pay | Admitting: Family Medicine

## 2017-01-02 ENCOUNTER — Encounter: Payer: Self-pay | Admitting: Family Medicine

## 2017-01-10 ENCOUNTER — Telehealth: Payer: Self-pay | Admitting: Family Medicine

## 2017-01-10 MED ORDER — NAPROXEN 500 MG PO TABS: 500.0000 mg | ORAL_TABLET | Freq: Two times a day (BID) | ORAL | 0 refills | Status: DC

## 2017-01-10 NOTE — Telephone Encounter (Signed)
Patient notified

## 2017-01-10 NOTE — Telephone Encounter (Signed)
Patient called to see if Dr Wynetta Emery would call her in something for pain.  She said she still has the Ibuprofen but it does not help.   Thank Dennis Bast Santiago Glad  [phone]

## 2017-01-10 NOTE — Telephone Encounter (Signed)
She can stop her ibuprofen and try the naproxen, if she's not getting better with it, she will need to be seen either by [place] or the orthopedist who we referred her to.

## 2017-01-13 ENCOUNTER — Ambulatory Visit (INDEPENDENT_AMBULATORY_CARE_PROVIDER_SITE_OTHER): Payer: Commercial Managed Care - PPO | Admitting: Family Medicine

## 2017-01-13 ENCOUNTER — Encounter: Payer: Self-pay | Admitting: Family Medicine

## 2017-01-13 VITALS — BP 116/77 | HR 95 | Temp 98.4°F | Ht 64.0 in | Wt 245.4 lb

## 2017-01-13 DIAGNOSIS — M25461 Effusion, right knee: Secondary | ICD-10-CM

## 2017-01-13 MED ORDER — TRAMADOL HCL 50 MG PO TABS: 50.0000 mg | ORAL_TABLET | Freq: Three times a day (TID) | ORAL | 0 refills | Status: DC | PRN

## 2017-01-13 NOTE — Progress Notes (Signed)
BP 116/77 (BP Location: Left Arm, Patient Position: Sitting, Cuff Size: Large)   Pulse 95   Temp 98.4 F (36.9 C)   Ht 5\' 4"  (1.626 m)   Wt 245 lb 6.4 oz (111.3 kg)   LMP  (LMP Unknown)   SpO2 95%   BMI 42.12 kg/m    Subjective:    Patient ID: Amanda Moran, female    DOB: February 02, 1971, 46 y.o.   MRN: [id number]  HPI: Amanda Moran is a 46 y.o. female  Chief Complaint  Patient presents with  . Knee Pain    MRI shown torn mesicus. Unable to see Ortho Surgeon today, canceled due to weather.   KNEE PAIN- to see  Duration: chronic Involved knee: right Mechanism of injury: trauma Location:diffuse Onset: sudden Severity: severe Quality:  sharp and aching Frequency: constant Radiation: no Aggravating factors: weight bearing, walking, running, stairs, bending and movement  Alleviating factors: HEP, APAP, NSAIDs, brace, crutches and rest  Status: worse Treatments attempted: rest, ice, heat, APAP, ibuprofen and aleve  Relief with NSAIDs?:  mild Weakness with weight bearing or walking: yes Sensation of giving way: yes Locking: yes Popping: yes Bruising: no Swelling: yes Redness: yes Paresthesias/decreased sensation: no Fevers: no  Relevant past medical, surgical, family and social history reviewed and updated as indicated. Interim medical history since our last visit reviewed. Allergies and medications reviewed and updated.  Review of Systems  Constitutional: Negative.   Respiratory: Negative.   Cardiovascular: Negative.   Musculoskeletal: Positive for arthralgias and joint swelling. Negative for back pain, gait problem, myalgias, neck pain and neck stiffness.  Psychiatric/Behavioral: Negative.     Per HPI unless specifically indicated above     Objective:    BP 116/77 (BP Location: Left Arm, Patient Position: Sitting, Cuff Size: Large)   Pulse 95   Temp 98.4 F (36.9 C)   Ht 5\' 4"  (1.626 m)   Wt 245 lb 6.4 oz (111.3 kg)   LMP  (LMP Unknown)   SpO2 95%    BMI 42.12 kg/m   Wt Readings from Last 3 Encounters:  01/13/17 245 lb 6.4 oz (111.3 kg)  10/14/16 241 lb 1.6 oz (109.4 kg)  08/30/16 238 lb (108 kg)    Physical Exam  Constitutional: She is oriented to person, place, and time. She appears well-developed and well-nourished. No distress.  HENT:  Head: Normocephalic and atraumatic.  Right Ear: Hearing normal.  Left Ear: Hearing normal.  Nose: Nose normal.  Eyes: Conjunctivae and lids are normal. Right eye exhibits no discharge. Left eye exhibits no discharge. No scleral icterus.  Pulmonary/Chest: Effort normal. No respiratory distress.  Musculoskeletal: Normal range of motion. She exhibits edema and tenderness. She exhibits no deformity.  Neurological: She is alert and oriented to person, place, and time.  Skin: Skin is warm, dry and intact. No rash noted. No erythema. No pallor.  Psychiatric: She has a normal mood and affect. Her speech is normal and behavior is normal. Judgment and thought content normal. Cognition and memory are normal.  Nursing note and vitals reviewed.   Results for orders placed or performed in visit on 08/22/16  Rapid strep screen (not at [facility])  Result Value Ref Range   Strep Gp A Ag, IA W/Reflex Positive (A) Negative  Microscopic Examination  Result Value Ref Range   WBC, UA 0-5 0 - 5 /hpf   RBC, UA 0-2 0 - 2 /hpf   Epithelial Cells (non renal) 0-10 0 - 10 /hpf  Mucus, UA Present Not Estab.   Bacteria, UA Few None seen/Few  Bayer DCA Hb A1c Waived  Result Value Ref Range   Bayer DCA Hb A1c Waived 5.7 <7.0 %  CBC with Differential/Platelet  Result Value Ref Range   WBC 14.5 (H) 3.4 - 10.8 x10E3/uL   RBC 4.63 3.77 - 5.28 x10E6/uL   Hemoglobin 13.5 11.1 - 15.9 g/dL   Hematocrit 40.5 34.0 - 46.6 %   MCV 88 79 - 97 fL   MCH 29.2 26.6 - 33.0 pg   MCHC 33.3 31.5 - 35.7 g/dL   RDW 13.9 12.3 - 15.4 %   Platelets 229 150 - 379 x10E3/uL   Neutrophils 77 %   Lymphs 15 %   Monocytes 7 %   Eos 1 %    Basos 0 %   Neutrophils Absolute 11.2 (H) 1.4 - 7.0 x10E3/uL   Lymphocytes Absolute 2.1 0.7 - 3.1 x10E3/uL   Monocytes Absolute 1.0 (H) 0.1 - 0.9 x10E3/uL   EOS (ABSOLUTE) 0.1 0.0 - 0.4 x10E3/uL   Basophils Absolute 0.0 0.0 - 0.2 x10E3/uL   Immature Granulocytes 0 %   Immature Grans (Abs) 0.0 0.0 - 0.1 x10E3/uL  Comprehensive metabolic panel  Result Value Ref Range   Glucose 129 (H) 65 - 99 mg/dL   BUN 8 6 - 24 mg/dL   Creatinine, Ser 0.66 0.57 - 1.00 mg/dL   GFR calc non Af Amer 109 >59 mL/min/1.73   GFR calc Af Amer 125 >59 mL/min/1.73   BUN/Creatinine Ratio 12 9 - 23   Sodium 140 134 - 144 mmol/L   Potassium 3.5 3.5 - 5.2 mmol/L   Chloride 100 96 - 106 mmol/L   CO2 22 18 - 29 mmol/L   Calcium 9.3 8.7 - 10.2 mg/dL   Total Protein 7.3 6.0 - 8.5 g/dL   Albumin 4.2 3.5 - 5.5 g/dL   Globulin, Total 3.1 1.5 - 4.5 g/dL   Albumin/Globulin Ratio 1.4 1.2 - 2.2   Bilirubin Total 0.6 0.0 - 1.2 mg/dL   Alkaline Phosphatase 74 39 - 117 IU/L   AST 20 0 - 40 IU/L   ALT 18 0 - 32 IU/L  Lipid Panel Piccolo, Waived  Result Value Ref Range   Cholesterol Piccolo, Waived 203 (H) <200 mg/dL   HDL Chol Piccolo, Waived 49 (L) >59 mg/dL   Triglycerides Piccolo,Waived 85 <150 mg/dL   Chol/HDL Ratio Piccolo,Waive 4.1 mg/dL   LDL Chol Calc Piccolo Waived 137 (H) <100 mg/dL   VLDL Chol Calc Piccolo,Waive 17 <30 mg/dL  Microalbumin, Urine Waived  Result Value Ref Range   Microalb, Ur Waived 80 (H) 0 - 19 mg/L   Creatinine, Urine Waived 300 10 - 300 mg/dL   Microalb/Creat Ratio 30-300 (H) <30 mg/g  TSH  Result Value Ref Range   TSH 1.070 0.450 - 4.500 uIU/mL  UA/M w/rflx Culture, Routine  Result Value Ref Range   Specific Gravity, UA >1.030 (H) 1.005 - 1.030   pH, UA 5.5 5.0 - 7.5   Color, UA Yellow Yellow   Appearance Ur Clear Clear   Leukocytes, UA Trace (A) Negative   Protein, UA 1+ (A) Negative/Trace   Glucose, UA Negative Negative   Ketones, UA Negative Negative   RBC, UA 2+ (A)  Negative   Bilirubin, UA Negative Negative   Urobilinogen, Ur 0.2 0.2 - 1.0 mg/dL   Nitrite, UA Negative Negative   Microscopic Examination See below:    Urinalysis Reflex Comment   Microalbumin,  urine  Result Value Ref Range   Microalb, Ur ^30   Hemoglobin A1c  Result Value Ref Range   Hemoglobin A1C 5.7   Urine Culture, Routine  Result Value Ref Range   Urine Culture, Routine Final report    Urine Culture result 1 Comment       Assessment & Plan:   Problem List Items Addressed This Visit    None    Visit Diagnoses    Effusion of right knee    -  Primary   Injection given today. Tramadol for pain. To see orthopedics on Wednesday. Call with any concerns.       Procedure: Right  Knee Intraarticular Steroid Injection        Diagnosis:   ICD-9-CM ICD-10-CM   1. Effusion of right knee 719.06 M25.461    Injection given today. Tramadol for pain. To see orthopedics on Wednesday. Call with any concerns.     Physician: MJ Consent:  Risks, benefits, and alternative treatments discussed and all questions were answered.  Patient elected to proceed and verbal consent obtained.  Description: Area prepped and draped using  semi-sterile technique.  Using a anterior/lateral approach, a mixture of 4 cc of  1% lidocaine & 1 cc of Kenalog 40 was injected into knee joint.  A bandage was then placed over the injection site. Complications: none Post Procedure Instructions: Wound care instructions discussed and patient was instructed to keep area clean and dry.  Signs and symptoms of infection discussed, patient agrees to contact the office ASAP should they occur.  Follow Up: Return if symptoms worsen or fail to improve.  Follow up plan: Return if symptoms worsen or fail to improve.

## 2017-01-16 ENCOUNTER — Ambulatory Visit: Payer: Commercial Managed Care - PPO | Admitting: Family Medicine

## 2017-01-23 ENCOUNTER — Other Ambulatory Visit: Payer: Self-pay | Admitting: Orthopedic Surgery

## 2017-01-25 ENCOUNTER — Telehealth: Payer: Self-pay

## 2017-01-25 NOTE — Telephone Encounter (Signed)
Ortho sent over paperwork for surgical clearance for her right knee acl reconstruction and medial and lateral partial menisoctomy.  Do you need her to schedule a surgical clearance appt to have the paperwork completed? (Paperwork is on [employer])

## 2017-01-25 NOTE — Telephone Encounter (Signed)
Needs appointment

## 2017-01-26 NOTE — Telephone Encounter (Signed)
Left message to call.

## 2017-01-26 NOTE — Telephone Encounter (Signed)
Appt. Scheduled.

## 2017-01-30 ENCOUNTER — Encounter
Admission: RE | Admit: 2017-01-30 | Discharge: 2017-01-30 | Disposition: A | Discharge status: Home / Self Care | Payer: Commercial Managed Care - PPO | Source: Ambulatory Visit | Attending: Orthopedic Surgery | Admitting: Orthopedic Surgery

## 2017-01-30 DIAGNOSIS — Z01818 Encounter for other preprocedural examination: Secondary | ICD-10-CM | POA: Diagnosis not present

## 2017-01-30 LAB — BASIC METABOLIC PANEL
ANION GAP: 6 (ref 5–15)
BUN: 11 mg/dL (ref 6–20)
CHLORIDE: 105 mmol/L (ref 101–111)
CO2: 28 mmol/L (ref 22–32)
Calcium: 8.8 mg/dL — ABNORMAL LOW (ref 8.9–10.3)
Creatinine, Ser: 0.6 mg/dL (ref 0.44–1.00)
Glucose, Bld: 89 mg/dL (ref 65–99)
POTASSIUM: 3.6 mmol/L (ref 3.5–5.1)
SODIUM: 139 mmol/L (ref 135–145)

## 2017-01-30 LAB — CBC WITH DIFFERENTIAL/PLATELET
BASOS ABS: 0 10*3/uL (ref 0–0.1)
Basophils Relative: 0 %
Eosinophils Absolute: 0.2 10*3/uL (ref 0–0.7)
Eosinophils Relative: 2 %
HCT: 40.2 % (ref 35.0–47.0)
Hemoglobin: 13.7 g/dL (ref 12.0–16.0)
LYMPHS ABS: 1.7 10*3/uL (ref 1.0–3.6)
LYMPHS PCT: 24 %
MCH: 29.9 pg (ref 26.0–34.0)
MCHC: 34 g/dL (ref 32.0–36.0)
MCV: 87.9 fL (ref 80.0–100.0)
Monocytes Absolute: 0.5 10*3/uL (ref 0.2–0.9)
Monocytes Relative: 7 %
NEUTROS ABS: 4.7 10*3/uL (ref 1.4–6.5)
NEUTROS PCT: 67 %
Platelets: 202 10*3/uL (ref 150–440)
RBC: 4.58 MIL/uL (ref 3.80–5.20)
RDW: 14.4 % (ref 11.5–14.5)
WBC: 7.1 10*3/uL (ref 3.6–11.0)

## 2017-01-30 LAB — PROTIME-INR
INR: 0.95
Prothrombin Time: 12.7 seconds (ref 11.4–15.2)

## 2017-01-30 LAB — APTT: aPTT: 29 seconds (ref 24–36)

## 2017-01-30 NOTE — Patient Instructions (Signed)
  Your procedure is scheduled on: Thurs. 02/09/17 Report to Day Surgery. To find out your arrival time please call [phone] between 1PM - 3PM on Wed.02/08/17  Remember: Instructions that are not followed completely may result in serious medical risk, up to and including death, or upon the discretion of your surgeon and anesthesiologist your surgery may need to be rescheduled.    __x__ 1. Do not eat food or drink liquids after midnight. No gum chewing or hard candies.     __x__ 2. No Alcohol for 24 hours before or after surgery.   __x__ 3. Do Not Smoke For 24 Hours Prior to Your Surgery.   ____ 4. Bring all medications with you on the day of surgery if instructed.    __x__ 5. Notify your doctor if there is any change in your medical condition     (cold, fever, infections).       Do not wear jewelry, make-up, hairpins, clips or nail polish.  Do not wear lotions, powders, or perfumes. You may wear deodorant.  Do not shave 48 hours prior to surgery. Men may shave face and neck.  Do not bring valuables to the hospital.    [facility] is not responsible for any belongings or valuables.               Contacts, dentures or bridgework may not be worn into surgery.  Leave your suitcase in the car. After surgery it may be brought to your room.  For patients admitted to the hospital, discharge time is determined by your                treatment team.   Patients discharged the day of surgery will not be allowed to drive home.   Please read over the following fact sheets that you were given:      __x__ Take these medicines the morning of surgery with A SIP OF WATER:    1. traMADol (ULTRAM) 50 MG tablet  2.   3.   4.  5.  6.  ____ Fleet Enema (as directed)   __x__ Use CHG Soap as directed  ____ Use inhalers on the day of surgery  ____ Stop metformin 2 days prior to surgery    ____ Take 1/2 of usual insulin dose the night before surgery and none on the morning of surgery.    ____ Stop Coumadin/Plavix/aspirin on  _x___ Stop Anti-inflammatories onibuprofen (ADVIL,MOTRIN) 600 MG tablet, naproxen (NAPROSYN) 500 MG tablet ( May take Tylenol)   ____ Stop supplements until after surgery.    ____ Bring C-Pap to the hospital.

## 2017-01-31 ENCOUNTER — Telehealth: Payer: Self-pay | Admitting: Family Medicine

## 2017-01-31 NOTE — Telephone Encounter (Signed)
[name] with [facility] called and stated that the pt would like to know if she could be cleared for surgery without having an appt.

## 2017-01-31 NOTE — Telephone Encounter (Signed)
I haven't seen her for her physical since August, and she hasn't had an EKG since 2016, so I would not be able to clear her without seeing her.

## 2017-02-01 ENCOUNTER — Ambulatory Visit (INDEPENDENT_AMBULATORY_CARE_PROVIDER_SITE_OTHER): Payer: Commercial Managed Care - PPO | Admitting: Family Medicine

## 2017-02-01 ENCOUNTER — Encounter: Payer: Self-pay | Admitting: Family Medicine

## 2017-02-01 VITALS — BP 113/76 | HR 95 | Temp 98.7°F | Ht 64.0 in | Wt 245.2 lb

## 2017-02-01 DIAGNOSIS — Z01818 Encounter for other preprocedural examination: Secondary | ICD-10-CM | POA: Diagnosis not present

## 2017-02-01 NOTE — Progress Notes (Signed)
BP 113/76 (BP Location: Left Arm, Patient Position: Sitting, Cuff Size: Large)   Pulse 95   Temp 98.7 F (37.1 C)   Ht 5\' 4"  (1.626 m)   Wt 245 lb 3.2 oz (111.2 kg)   LMP  (LMP Unknown)   SpO2 96%   BMI 42.09 kg/m    Subjective:    Patient ID: Amanda Moran, female    DOB: December 30, 1970, 46 y.o.   MRN: [id number]  HPI: Amanda Moran is a 46 y.o. female  Chief Complaint  Patient presents with  . Surgical Clearence   Brannon has had several surgeries in the past. Her last surgery was her hysterectomy in June 2016. She has never had any problems with anesthesia. She has never had any problems with being extubated. No one has had any problems with anesthesia in the family. She has been feeling well except for her knee. She has not been having any problems with URI symptoms or any other complaints.   Relevant past medical, surgical, family and social history reviewed and updated as indicated. Interim medical history since our last visit reviewed. Allergies and medications reviewed and updated.  Review of Systems  Constitutional: Negative.   Respiratory: Negative.   Cardiovascular: Negative.   Gastrointestinal: Negative.   Musculoskeletal: Positive for arthralgias. Negative for back pain, gait problem, joint swelling, myalgias, neck pain and neck stiffness.  Psychiatric/Behavioral: Negative.     Per HPI unless specifically indicated above     Objective:    BP 113/76 (BP Location: Left Arm, Patient Position: Sitting, Cuff Size: Large)   Pulse 95   Temp 98.7 F (37.1 C)   Ht 5\' 4"  (1.626 m)   Wt 245 lb 3.2 oz (111.2 kg)   LMP  (LMP Unknown)   SpO2 96%   BMI 42.09 kg/m   Wt Readings from Last 3 Encounters:  02/01/17 245 lb 3.2 oz (111.2 kg)  01/30/17 245 lb (111.1 kg)  01/13/17 245 lb 6.4 oz (111.3 kg)    Physical Exam  Constitutional: She is oriented to person, place, and time. She appears well-developed and well-nourished. No distress.  HENT:  Head: Normocephalic  and atraumatic.  Right Ear: Hearing and external ear normal.  Left Ear: Hearing and external ear normal.  Nose: Nose normal.  Mouth/Throat: Oropharynx is clear and moist. No oropharyngeal exudate.  Eyes: Conjunctivae, EOM and lids are normal. Pupils are equal, round, and reactive to light. Right eye exhibits no discharge. Left eye exhibits no discharge. No scleral icterus.  Neck: Neck supple. No JVD present. No tracheal deviation present. No thyromegaly present.  Cardiovascular: Normal rate, regular rhythm, normal heart sounds and intact distal pulses.  Exam reveals no gallop and no friction rub.   No murmur heard. Pulmonary/Chest: Effort normal and breath sounds normal. No stridor. No respiratory distress. She has no wheezes. She has no rales. She exhibits no tenderness.  Musculoskeletal: Normal range of motion.  Lymphadenopathy:    She has no cervical adenopathy.  Neurological: She is alert and oriented to person, place, and time.  Skin: Skin is warm, dry and intact. No rash noted. She is not diaphoretic. No erythema. No pallor.  Psychiatric: She has a normal mood and affect. Her speech is normal and behavior is normal. Judgment and thought content normal. Cognition and memory are normal.  Nursing note and vitals reviewed.   Results for orders placed or performed during the hospital encounter of 01/30/17  APTT  Result Value Ref Range  aPTT 29 24 - 36 seconds  Basic metabolic panel  Result Value Ref Range   Sodium 139 135 - 145 mmol/L   Potassium 3.6 3.5 - 5.1 mmol/L   Chloride 105 101 - 111 mmol/L   CO2 28 22 - 32 mmol/L   Glucose, Bld 89 65 - 99 mg/dL   BUN 11 6 - 20 mg/dL   Creatinine, Ser 0.60 0.44 - 1.00 mg/dL   Calcium 8.8 (L) 8.9 - 10.3 mg/dL   GFR calc non Af Amer >60 >60 mL/min   GFR calc Af Amer >60 >60 mL/min   Anion gap 6 5 - 15  CBC WITH DIFFERENTIAL  Result Value Ref Range   WBC 7.1 3.6 - 11.0 K/uL   RBC 4.58 3.80 - 5.20 MIL/uL   Hemoglobin 13.7 12.0 - 16.0  g/dL   HCT 40.2 35.0 - 47.0 %   MCV 87.9 80.0 - 100.0 fL   MCH 29.9 26.0 - 34.0 pg   MCHC 34.0 32.0 - 36.0 g/dL   RDW 14.4 11.5 - 14.5 %   Platelets 202 150 - 440 K/uL   Neutrophils Relative % 67 %   Neutro Abs 4.7 1.4 - 6.5 K/uL   Lymphocytes Relative 24 %   Lymphs Abs 1.7 1.0 - 3.6 K/uL   Monocytes Relative 7 %   Monocytes Absolute 0.5 0.2 - 0.9 K/uL   Eosinophils Relative 2 %   Eosinophils Absolute 0.2 0 - 0.7 K/uL   Basophils Relative 0 %   Basophils Absolute 0.0 0 - 0.1 K/uL  Protime-INR  Result Value Ref Range   Prothrombin Time 12.7 11.4 - 15.2 seconds   INR 0.95       Assessment & Plan:   Problem List Items Addressed This Visit    None    Visit Diagnoses    Pre-op exam    -  Primary   Labs normal. EKG normal. No history of problems with anesthesia. Cleared for surgery.   Relevant Orders   EKG 12-Lead (Completed)       Follow up plan: Return As scheduled.

## 2017-02-01 NOTE — Telephone Encounter (Signed)
Office notified

## 2017-02-13 MED ORDER — CEFAZOLIN SODIUM-DEXTROSE 2-4 GM/100ML-% IV SOLN: 2.0000 g | INTRAVENOUS | Status: AC

## 2017-02-14 ENCOUNTER — Encounter
Admission: RE | Disposition: A | Discharge status: Home / Self Care | Payer: Self-pay | Source: Ambulatory Visit | Attending: Orthopedic Surgery

## 2017-02-14 ENCOUNTER — Ambulatory Visit: Payer: Commercial Managed Care - PPO | Admitting: Anesthesiology

## 2017-02-14 ENCOUNTER — Ambulatory Visit
Admission: RE | Admit: 2017-02-14 | Discharge: 2017-02-14 | Disposition: A | Discharge status: Home / Self Care | Payer: Commercial Managed Care - PPO | Source: Ambulatory Visit | Attending: Orthopedic Surgery | Admitting: Orthopedic Surgery

## 2017-02-14 DIAGNOSIS — S83511A Sprain of anterior cruciate ligament of right knee, initial encounter: Secondary | ICD-10-CM | POA: Diagnosis present

## 2017-02-14 DIAGNOSIS — X58XXXA Exposure to other specified factors, initial encounter: Secondary | ICD-10-CM | POA: Insufficient documentation

## 2017-02-14 DIAGNOSIS — I252 Old myocardial infarction: Secondary | ICD-10-CM | POA: Insufficient documentation

## 2017-02-14 DIAGNOSIS — E119 Type 2 diabetes mellitus without complications: Secondary | ICD-10-CM | POA: Diagnosis not present

## 2017-02-14 DIAGNOSIS — Z90710 Acquired absence of both cervix and uterus: Secondary | ICD-10-CM | POA: Diagnosis not present

## 2017-02-14 DIAGNOSIS — D509 Iron deficiency anemia, unspecified: Secondary | ICD-10-CM | POA: Diagnosis not present

## 2017-02-14 DIAGNOSIS — M94261 Chondromalacia, right knee: Secondary | ICD-10-CM | POA: Diagnosis not present

## 2017-02-14 HISTORY — PX: KNEE ARTHROSCOPY WITH ANTERIOR CRUCIATE LIGAMENT (ACL) REPAIR WITH HAMSTRING GRAFT: SHX5645

## 2017-02-14 LAB — GLUCOSE, CAPILLARY: GLUCOSE-CAPILLARY: 108 mg/dL — AB (ref 65–99)

## 2017-02-14 SURGERY — KNEE ARTHROSCOPY WITH ANTERIOR CRUCIATE LIGAMENT (ACL) REPAIR WITH HAMSTRING GRAFT
Anesthesia: General | Laterality: Right | Wound class: Clean

## 2017-02-14 MED ORDER — SODIUM CHLORIDE 0.9 % IV SOLN
INTRAVENOUS | Status: DC | PRN
  Administered 2017-02-14: 10 ug/min via INTRAVENOUS

## 2017-02-14 MED ORDER — FAMOTIDINE 20 MG PO TABS
ORAL_TABLET | ORAL | Status: AC
  Administered 2017-02-14: 20 mg via ORAL
  Filled 2017-02-14: qty 1

## 2017-02-14 MED ORDER — ONDANSETRON HCL 4 MG/2ML IJ SOLN
INTRAMUSCULAR | Status: AC
  Filled 2017-02-14: qty 2

## 2017-02-14 MED ORDER — ONDANSETRON HCL 4 MG PO TABS: 4.0000 mg | ORAL_TABLET | Freq: Three times a day (TID) | ORAL | 0 refills | Status: DC | PRN

## 2017-02-14 MED ORDER — FAMOTIDINE 20 MG PO TABS
20.0000 mg | ORAL_TABLET | Freq: Once | ORAL | Status: AC
  Administered 2017-02-14: 20 mg via ORAL

## 2017-02-14 MED ORDER — MIDAZOLAM HCL 2 MG/2ML IJ SOLN
INTRAMUSCULAR | Status: DC | PRN
  Administered 2017-02-14: 2 mg via INTRAVENOUS

## 2017-02-14 MED ORDER — FENTANYL CITRATE (PF) 100 MCG/2ML IJ SOLN
INTRAMUSCULAR | Status: AC
  Filled 2017-02-14: qty 2

## 2017-02-14 MED ORDER — ROCURONIUM BROMIDE 50 MG/5ML IV SOSY
PREFILLED_SYRINGE | INTRAVENOUS | Status: AC
  Filled 2017-02-14: qty 5

## 2017-02-14 MED ORDER — NEOMYCIN-POLYMYXIN B GU 40-200000 IR SOLN
Status: AC
  Filled 2017-02-14: qty 4

## 2017-02-14 MED ORDER — LIDOCAINE HCL (PF) 2 % IJ SOLN
INTRAMUSCULAR | Status: AC
  Filled 2017-02-14: qty 2

## 2017-02-14 MED ORDER — DEXAMETHASONE SODIUM PHOSPHATE 10 MG/ML IJ SOLN
INTRAMUSCULAR | Status: AC
  Filled 2017-02-14: qty 1

## 2017-02-14 MED ORDER — OXYCODONE HCL 5 MG PO TABS: 5.0000 mg | ORAL_TABLET | ORAL | 0 refills | Status: DC | PRN

## 2017-02-14 MED ORDER — SUGAMMADEX SODIUM 200 MG/2ML IV SOLN
INTRAVENOUS | Status: AC
  Filled 2017-02-14: qty 2

## 2017-02-14 MED ORDER — EPINEPHRINE 30 MG/30ML IJ SOLN
INTRAMUSCULAR | Status: AC
  Filled 2017-02-14: qty 1

## 2017-02-14 MED ORDER — ASPIRIN EC 325 MG PO TBEC: 325.0000 mg | DELAYED_RELEASE_TABLET | Freq: Every day | ORAL | 0 refills | Status: DC

## 2017-02-14 MED ORDER — CHLORHEXIDINE GLUCONATE CLOTH 2 % EX PADS: 6.0000 | MEDICATED_PAD | Freq: Once | CUTANEOUS | Status: DC

## 2017-02-14 MED ORDER — KETOROLAC TROMETHAMINE 30 MG/ML IJ SOLN
INTRAMUSCULAR | Status: AC
  Filled 2017-02-14: qty 1

## 2017-02-14 MED ORDER — FENTANYL CITRATE (PF) 100 MCG/2ML IJ SOLN
INTRAMUSCULAR | Status: AC
  Administered 2017-02-14: 50 ug via INTRAVENOUS
  Filled 2017-02-14: qty 2

## 2017-02-14 MED ORDER — FENTANYL CITRATE (PF) 250 MCG/5ML IJ SOLN
INTRAMUSCULAR | Status: AC
  Filled 2017-02-14: qty 5

## 2017-02-14 MED ORDER — CEFAZOLIN SODIUM-DEXTROSE 2-4 GM/100ML-% IV SOLN
INTRAVENOUS | Status: DC
Start: 2017-02-14 — End: 2017-02-14
  Filled 2017-02-14: qty 100

## 2017-02-14 MED ORDER — FENTANYL CITRATE (PF) 100 MCG/2ML IJ SOLN
INTRAMUSCULAR | Status: DC | PRN
  Administered 2017-02-14 (×2): 50 ug via INTRAVENOUS
  Administered 2017-02-14: 100 ug via INTRAVENOUS
  Administered 2017-02-14: 50 ug via INTRAVENOUS
  Administered 2017-02-14: 100 ug via INTRAVENOUS

## 2017-02-14 MED ORDER — OXYCODONE HCL 5 MG PO TABS
ORAL_TABLET | ORAL | Status: AC
  Filled 2017-02-14: qty 1

## 2017-02-14 MED ORDER — PHENYLEPHRINE HCL 10 MG/ML IJ SOLN
INTRAMUSCULAR | Status: AC
  Filled 2017-02-14: qty 1

## 2017-02-14 MED ORDER — FENTANYL CITRATE (PF) 100 MCG/2ML IJ SOLN
25.0000 ug | INTRAMUSCULAR | Status: DC | PRN
  Administered 2017-02-14 (×3): 50 ug via INTRAVENOUS

## 2017-02-14 MED ORDER — SODIUM CHLORIDE 0.9 % IV SOLN
INTRAVENOUS | Status: DC
  Administered 2017-02-14: 07:00:00 via INTRAVENOUS

## 2017-02-14 MED ORDER — DEXAMETHASONE SODIUM PHOSPHATE 10 MG/ML IJ SOLN
INTRAMUSCULAR | Status: DC | PRN
  Administered 2017-02-14: 10 mg via INTRAVENOUS

## 2017-02-14 MED ORDER — SUCCINYLCHOLINE CHLORIDE 20 MG/ML IJ SOLN
INTRAMUSCULAR | Status: AC
  Filled 2017-02-14: qty 1

## 2017-02-14 MED ORDER — CEFAZOLIN SODIUM-DEXTROSE 2-3 GM-% IV SOLR
INTRAVENOUS | Status: DC | PRN
  Administered 2017-02-14: 2 g via INTRAVENOUS

## 2017-02-14 MED ORDER — PROPOFOL 10 MG/ML IV BOLUS
INTRAVENOUS | Status: AC
  Filled 2017-02-14: qty 20

## 2017-02-14 MED ORDER — OXYCODONE HCL 5 MG PO TABS
5.0000 mg | ORAL_TABLET | Freq: Once | ORAL | Status: AC | PRN
  Administered 2017-02-14: 5 mg via ORAL

## 2017-02-14 MED ORDER — PROPOFOL 10 MG/ML IV BOLUS
INTRAVENOUS | Status: DC | PRN
  Administered 2017-02-14: 200 mg via INTRAVENOUS

## 2017-02-14 MED ORDER — MIDAZOLAM HCL 2 MG/2ML IJ SOLN
INTRAMUSCULAR | Status: AC
  Filled 2017-02-14: qty 2

## 2017-02-14 MED ORDER — KETOROLAC TROMETHAMINE 30 MG/ML IJ SOLN
INTRAMUSCULAR | Status: DC | PRN
  Administered 2017-02-14: 30 mg via INTRAVENOUS

## 2017-02-14 MED ORDER — ACETAMINOPHEN 10 MG/ML IV SOLN
INTRAVENOUS | Status: DC | PRN
  Administered 2017-02-14: 1000 mg via INTRAVENOUS

## 2017-02-14 MED ORDER — LIDOCAINE HCL (PF) 1 % IJ SOLN
INTRAMUSCULAR | Status: AC
  Filled 2017-02-14: qty 30

## 2017-02-14 MED ORDER — ACETAMINOPHEN 10 MG/ML IV SOLN
INTRAVENOUS | Status: AC
Start: 2017-02-14 — End: 2017-02-14
  Filled 2017-02-14: qty 100

## 2017-02-14 MED ORDER — SUGAMMADEX SODIUM 200 MG/2ML IV SOLN
INTRAVENOUS | Status: DC | PRN
  Administered 2017-02-14: 222.2 mg via INTRAVENOUS

## 2017-02-14 MED ORDER — ONDANSETRON HCL 4 MG/2ML IJ SOLN
INTRAMUSCULAR | Status: DC | PRN
  Administered 2017-02-14 (×2): 4 mg via INTRAVENOUS

## 2017-02-14 MED ORDER — SUCCINYLCHOLINE CHLORIDE 20 MG/ML IJ SOLN
INTRAMUSCULAR | Status: DC | PRN
  Administered 2017-02-14: 120 mg via INTRAVENOUS

## 2017-02-14 MED ORDER — LACTATED RINGERS IV SOLN
INTRAVENOUS | Status: DC | PRN
  Administered 2017-02-14: 08:00:00 via INTRAVENOUS

## 2017-02-14 MED ORDER — OXYCODONE HCL 5 MG/5ML PO SOLN: 5.0000 mg | Freq: Once | ORAL | Status: AC | PRN

## 2017-02-14 MED ORDER — LIDOCAINE HCL (CARDIAC) 20 MG/ML IV SOLN
INTRAVENOUS | Status: DC | PRN
  Administered 2017-02-14: 100 mg via INTRAVENOUS

## 2017-02-14 MED ORDER — ROCURONIUM BROMIDE 100 MG/10ML IV SOLN
INTRAVENOUS | Status: DC | PRN
  Administered 2017-02-14: 10 mg via INTRAVENOUS
  Administered 2017-02-14: 50 mg via INTRAVENOUS
  Administered 2017-02-14: 40 mg via INTRAVENOUS

## 2017-02-14 SURGICAL SUPPLY — 96 items
ADAPTER IRRIG TUBE 2 SPIKE SOL (ADAPTER) ×4 IMPLANT
AES-90SN ×2 IMPLANT
ANCHOR BUTTON TIGHTROPE ACL RT (Orthopedic Implant) ×2 IMPLANT
ANCHOR SUPER #2 ORTHOCORD (MISCELLANEOUS) IMPLANT
BASIN GRAD PLASTIC 32OZ STRL (MISCELLANEOUS) ×2 IMPLANT
BIT DRILL PIN RETRO (DRILL) ×1 IMPLANT
BLADE SURG 15 STRL LF DISP TIS (BLADE) ×1 IMPLANT
BLADE SURG 15 STRL SS (BLADE) ×1
BLADE SURG SZ11 CARB STEEL (BLADE) ×2 IMPLANT
BNDG COHESIVE 4X5 TAN STRL (GAUZE/BANDAGES/DRESSINGS) ×2 IMPLANT
BNDG COHESIVE 6X5 TAN STRL LF (GAUZE/BANDAGES/DRESSINGS) ×2 IMPLANT
BNDG ESMARK 6X12 TAN STRL LF (GAUZE/BANDAGES/DRESSINGS) IMPLANT
BUR RADIUS 3.5 (BURR) ×2 IMPLANT
BUR RADIUS 4.0X18.5 (BURR) ×2 IMPLANT
BUR RADIUS 5.5 (BURR) ×2 IMPLANT
CLEANER CAUTERY TIP 5X5 PAD (MISCELLANEOUS) ×1 IMPLANT
COOLER POLAR GLACIER W/PUMP (MISCELLANEOUS) ×2 IMPLANT
CUFF TOURN 24 STER (MISCELLANEOUS) IMPLANT
CUFF TOURN 30 STER DUAL PORT (MISCELLANEOUS) IMPLANT
CUFF TOURN 34 STER (MISCELLANEOUS) ×2 IMPLANT
CUTTER DUAL RETRO 9.5 STRL (CUTTER) ×2 IMPLANT
DRAPE FLUOR MINI C-ARM 54X84 (DRAPES) ×2 IMPLANT
DRAPE IMP U-DRAPE 54X76 (DRAPES) ×4 IMPLANT
DRAPE INCISE IOBAN 66X45 STRL (DRAPES) IMPLANT
DRAPE SHEET LG 3/4 BI-LAMINATE (DRAPES) ×4 IMPLANT
DRAPE TABLE BACK 80X90 (DRAPES) ×2 IMPLANT
DRAPE U-SHAPE 47X51 STRL (DRAPES) ×2 IMPLANT
DRILL FLIPCUTTER II 8.0MM (INSTRUMENTS) ×1 IMPLANT
DRILL FLIPCUTTER II 9.0MM (INSTRUMENTS) ×1 IMPLANT
DRILL PIN RETRO (DRILL) ×2
DURAPREP 26ML APPLICATOR (WOUND CARE) ×6 IMPLANT
ELECT REM PT RETURN 9FT ADLT (ELECTROSURGICAL) ×2
ELECTRODE REM PT RTRN 9FT ADLT (ELECTROSURGICAL) ×1 IMPLANT
FLIPCUTTER II 8.0MM (INSTRUMENTS) ×2
FLIPCUTTER II 9.0MM (INSTRUMENTS) ×2
GAUZE PETRO XEROFOAM 1X8 (MISCELLANEOUS) ×2 IMPLANT
GAUZE SPONGE 4X4 12PLY STRL (GAUZE/BANDAGES/DRESSINGS) ×2 IMPLANT
GLOVE BIOGEL PI IND STRL 9 (GLOVE) ×1 IMPLANT
GLOVE BIOGEL PI INDICATOR 9 (GLOVE) ×1
GLOVE SURG 9.0 ORTHO LTXF (GLOVE) ×4 IMPLANT
GOWN STRL REUS TWL 2XL XL LVL4 (GOWN DISPOSABLE) ×2 IMPLANT
GOWN STRL REUS W/ TWL LRG LVL3 (GOWN DISPOSABLE) ×1 IMPLANT
GOWN STRL REUS W/TWL LRG LVL3 (GOWN DISPOSABLE) ×1
GUIDEWIRE 1.1MM (WIRE) ×2 IMPLANT
GUIDEWIRE 1.2MMX18 (WIRE) ×2 IMPLANT
HANDLE YANKAUER SUCT BULB TIP (MISCELLANEOUS) ×2 IMPLANT
IV LACTATED RINGER IRRG 3000ML (IV SOLUTION) ×6
IV LR IRRIG 3000ML ARTHROMATIC (IV SOLUTION) ×6 IMPLANT
KIT RM TURNOVER STRD PROC AR (KITS) ×2 IMPLANT
LABEL OR SOLS (LABEL) ×2 IMPLANT
MAT BLUE FLOOR 46X72 FLO (MISCELLANEOUS) ×4 IMPLANT
NDL SAFETY ECLIPSE 18X1.5 (NEEDLE) ×1 IMPLANT
NEEDLE FILTER BLUNT 18X 1/2SAF (NEEDLE) ×1
NEEDLE FILTER BLUNT 18X1 1/2 (NEEDLE) ×1 IMPLANT
NEEDLE HYPO 18GX1.5 SHARP (NEEDLE) ×1
NEEDLE HYPO 22GX1.5 SAFETY (NEEDLE) ×2 IMPLANT
NEEDLE SPNL 18GX3.5 QUINCKE PK (NEEDLE) ×2 IMPLANT
NEPTUNE MANIFOLD (MISCELLANEOUS) ×2 IMPLANT
PACK ARTHROSCOPY KNEE (MISCELLANEOUS) ×2 IMPLANT
PAD ABD DERMACEA PRESS 5X9 (GAUZE/BANDAGES/DRESSINGS) ×4 IMPLANT
PAD CLEANER CAUTERY TIP 5X5 (MISCELLANEOUS) ×1
PAD WRAPON POLAR KNEE (MISCELLANEOUS) ×1 IMPLANT
PENCIL ELECTRO HAND CTR (MISCELLANEOUS) ×2 IMPLANT
SCREW BIO FULL THREADED 10X28 (Screw) ×2 IMPLANT
SET TUBE SUCT SHAVER OUTFL 24K (TUBING) ×2 IMPLANT
SET TUBE TIP INTRA-ARTICULAR (MISCELLANEOUS) ×2 IMPLANT
STAPLE SPIKE LIGAMENT 11X20MM (Staple) ×2 IMPLANT
STRIP CLOSURE SKIN 1/2X4 (GAUZE/BANDAGES/DRESSINGS) ×4 IMPLANT
STRIP CLOSURE SKIN 1/4X4 (GAUZE/BANDAGES/DRESSINGS) ×2 IMPLANT
SUCTION FRAZIER HANDLE 10FR (MISCELLANEOUS) ×1
SUCTION TUBE FRAZIER 10FR DISP (MISCELLANEOUS) ×1 IMPLANT
SUT 2 FIBERLOOP 20 STRT BLUE (SUTURE) ×4
SUT ETHILON 4-0 (SUTURE) ×1
SUT ETHILON 4-0 FS2 18XMFL BLK (SUTURE) ×1
SUT FIBERSNARE 2 CLSD LOOP (SUTURE) ×2 IMPLANT
SUT FIBERWIRE #2 38 T-5 BLUE (SUTURE) ×4
SUT MNCRL AB 4-0 PS2 18 (SUTURE) ×2 IMPLANT
SUT ORTHOCORD 2X36 W/O NDL (SUTURE) IMPLANT
SUT VIC AB 0 CT1 36 (SUTURE) ×2 IMPLANT
SUT VIC AB 2-0 CT2 27 (SUTURE) IMPLANT
SUT VIC AB 2-0 SH 27 (SUTURE) ×1
SUT VIC AB 2-0 SH 27XBRD (SUTURE) ×1 IMPLANT
SUTURE 2 FIBERLOOP 20 STRT BLU (SUTURE) ×2 IMPLANT
SUTURE ETHLN 4-0 FS2 18XMF BLK (SUTURE) ×1 IMPLANT
SUTURE FIBERWR #2 38 T-5 BLUE (SUTURE) ×2 IMPLANT
SYR 30ML LL (SYRINGE) ×2 IMPLANT
SYR BULB IRRIG 60ML STRL (SYRINGE) ×2 IMPLANT
SYRINGE 10CC LL (SYRINGE) ×4 IMPLANT
TAPE UMBIL 1/8X18 RADIOPA (MISCELLANEOUS) ×2 IMPLANT
TENDON GRACILIS FROZEN (Bone Implant) ×2 IMPLANT
TENDON GRACILIS FROZEN 230-320 (Bone Implant) ×1 IMPLANT
TENDON SEMI-TENDINOSUS (Bone Implant) ×2 IMPLANT
TUBING ARTHRO INFLOW-ONLY STRL (TUBING) ×2 IMPLANT
TUBING CONNECTING 10 (TUBING) IMPLANT
WAND HAND CNTRL MULTIVAC 90 (MISCELLANEOUS) ×2 IMPLANT
WRAPON POLAR PAD KNEE (MISCELLANEOUS) ×2

## 2017-02-14 NOTE — Discharge Instructions (Signed)
  AMBULATORY SURGERY  DISCHARGE INSTRUCTIONS   1) The drugs that you were given will stay in your system until tomorrow so for the next 24 hours you should not:  A) Drive an automobile B) Make any legal decisions C) Drink any alcoholic beverage   2) You may resume regular meals tomorrow.  Today it is better to start with liquids and gradually work up to solid foods.  You may eat anything you prefer, but it is better to start with liquids, then soup and crackers, and gradually work up to solid foods.   3) Please notify your doctor immediately if you have any unusual bleeding, trouble breathing, redness and pain at the surgery site, drainage, fever, or pain not relieved by medication.    4) Additional Instructions: TAKE A STOOL SOFTENER TWICE A DAY WHILE TAKING NARCOTIC PAIN MEDICINE TO PREVENT CONSTIPATION   Please contact your physician with any problems or Same Day Surgery at [phone], Monday through Friday 6 am to 4 pm, or [place] at [facility] number at [phone].   

## 2017-02-14 NOTE — Op Note (Signed)
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 female with a diagnosis of S83.511A Sprain of anterior cruciate ligament of right knee, init who failed conservative measures and elected for surgical management.    The risks benefits and alternatives were discussed with the patient preoperatively including but not limited to the risks of infection, bleeding, nerve or blood vessel injury, knee stiffness/arthrofibrosis, hardware failure, re-tear of the anterior cruciate ligament graft, persistent pain or instability, osteoarthritis and the need for revision surgery.  Medical risks include but are not limited to DVT and pulmonary embolism, stroke, pneumonia, respiratory failure and death. Patient understood these risks and wished to proceed with surgical reconstruction.   OPERATIVE IMPLANTS: Arthrex anterior cruciate ligament tightrope RC, Artherex biocomposite 10 x 28 mm tibial interference screw and 11 mm spiked staple.  OPERATIVE FINDINGS: Complete tear of the right anterior cruciate ligament from the femoral attachment and tricompartmental chondromalacia with focal chondral defect in the lateral tibial plateau  OPERATIVE PROCEDURE: Patient was in the preoperative area. The right knee was marked with the word yes according the hospital's correct site of surgery protocol. The patient was brought to the operating room and placed in the supine position. General anesthesia was administered. Patient was given for antibiotic prophylaxis. The lower extremity was prepped and draped in usual sterile fashion.   A time out was performed to  verify the patient's name, date of birth, medical record number, correct site of surgery correct procedure to be performed. It was also used to verify the patient received antibiotics and all appropriate instruments, implants and radiographs studies were available in the room. Once all in attendance were in agreement case began. A tourniquet was applied to the right upper thigh but was not inflated.  Exam under anesthesia was performed which demonstrated anterior laxity on anterior drawer testing and Lachman's test approximately 5 mm. There is no instability to varus or valgus stress testing at 0 and 30 of flexion. Patient range of motion from 0-120.   She did not have a significant effusion.   Proposed arthroscopy incisions were drawn out with a surgical marker and pre-injected with 1% lidocaine plain. An 11 blade was used to establish an inferior medial and lateral portals. The medial portal was created under direct visualization using an 18-gauge spinal needle for localization. A full diagnostic examination of the knee was performed including the suprapatellar pouch, the patella femoral joint, medial lateral gutters, the medial and lateral compartments, the intercondylar notch in the posterior knee. The anterior cruciate ligament fibers were debrided with a 4.0 resector shaver blade. A 5.5 resector shaver blade was used perform a notchplasty. Once the intercondylar notch was prepped arthroscopic instruments were removed and the attention was turned to graft preparation..  2 hamstring allografts were used for reconstruction of the anterior pressure ligament. This included a gracilis semitendinosus had a combined diameter of 9 mm on the femoral side and 9.5 on the tibial side.  These were prepared on the back table using 4 fiber loops. The combined graft was then placed on the Graftmaster and 15 mmHg applied. The graft was kept moist with a Ray-Tec.  The attention was  then turned to tunnel creation. The  femoral tunnel cutting guide was then placed through the lateral portal. The arthroscope was placed in the medial portal at this point. The intercondylar distance was measured at 40 mm at. A flip cutter drill guide was advanced into the intercondylar notch. The blade was engaged and the femoral tunnel was created in a retrograde fashion to 30 mm. A fiber stick suture was placed through the femoral tunnel brought out the lateral portal and clamped for later graft passage.   The attention was then turned to tibial tunnel creation. This was done with a fixed angle tibial retro-drill guide. A drill pin was inserted through the anterior tibia and advanced until it engaged the 9.5 mm drill bit. A tibial tunnel was then created in a retrograde fashion. The fiber stick was brought out through the tibial tunnel. The 4 stranded hamstring tibial autograft was then shuttled through the knee using the fiber stick graft. Once the button was flipped on the lateral femoral cortex FluoroScan image was taken to confirm it was laying flat against the lateral cortex of the femur. Once this was confirmed the hamstring graft was advanced into position using the white suture ends of the Arthrex tight rope RC button. The graft was bottomed out into the femoral tunnel. The knee was then cycled 25 times to remove creep. The knee was then flexed approximately 30. An Arthrex bio composite interference screw 10 x 28 mm was then advanced into position with countertraction on the tibial side of the graft and a posterior drawer force directed to the tibia. Once the interference screw was in position, an 11 mm spiked staple was placed over the distal end of the graft on the tibial side as backup fixation.  The patient had a firm endpoint without anterior laxity on Lachman's test. The range of motion was 0-120. Final arthroscopic images of the graft were taken. There was no graft impingement in full extension. The wounds were copiously  irrigated. The deep fascia of the anterior tibial incision was closed with interrupted 0 Vicryl.  The of the tibial incision subcutaneous tissue was closed with a 2-0 Vicryl and the skin was approximated with a running 4-0 Monocryl. The arthroscopy portal incisions were closed with 4-0 nylon along with the small stab incision over the lateral femur used for placement of the femoral tunnel.  Patient had a dry sterile dressing applied along with Steri-Strips and Xeroform. The incisions and the joint were injected with 0.25% Marcaine plain.  Patient had a Polar Care sleeve along with a hinged knee brace locked in extension. The patient was brought to the PACU in stable condition. I was scrubbed and present the entire case and all sharp and instrument counts were correct at conclusion the case. I spoke with the patient's husband in the postop consultation room to let him know that patient was stable in recovery room the case had been performed without complication.

## 2017-02-14 NOTE — Transfer of Care (Signed)
Immediate Anesthesia Transfer of Care Note  Patient: Amanda Moran  Procedure(s) Performed: Procedure(s): KNEE ARTHROSCOPY WITH ANTERIOR CRUCIATE LIGAMENT (ACL) REPAIR WITH HAMSTRING GRAFT (Right)  Patient Location: PACU  Anesthesia Type:General  Level of Consciousness: awake and sedated  Airway & Oxygen Therapy: Patient Spontanous Breathing and Patient connected to face mask oxygen  Post-op Assessment: Report given to RN and Post -op Vital signs reviewed and stable  Post vital signs: Reviewed and stable  Last Vitals:  Vitals:   02/14/17 0630 02/14/17 1115  BP: 129/81 (!) 147/91  Pulse: 87 (!) 102  Resp: 16 18  Temp: (!) 36.1 C 37.4 C    Last Pain:  Vitals:   02/14/17 1115  TempSrc:   PainSc: (P) Asleep         Complications: No apparent anesthesia complications

## 2017-02-14 NOTE — Anesthesia Procedure Notes (Signed)
Procedure Name: Intubation Date/Time: 02/14/2017 8:29 AM Performed by: Nelda Marseille Pre-anesthesia Checklist: Patient identified, Patient being monitored, Timeout performed, Emergency Drugs available and Suction available Patient Re-evaluated:Patient Re-evaluated prior to inductionOxygen Delivery Method: Circle system utilized Preoxygenation: Pre-oxygenation with 100% oxygen Intubation Type: IV induction Ventilation: Mask ventilation without difficulty Laryngoscope Size: 3 and McGraph Grade View: Grade II Tube type: Oral Tube size: 7.0 mm Number of attempts: 1 Airway Equipment and Method: Stylet and Video-laryngoscopy Placement Confirmation: ETT inserted through vocal cords under direct vision,  positive ETCO2 and breath sounds checked- equal and bilateral Secured at: 21 cm Tube secured with: Tape Dental Injury: Teeth and Oropharynx as per pre-operative assessment  Difficulty Due To: Difficulty was anticipated and Difficult Airway- due to dentition

## 2017-02-14 NOTE — H&P (Signed)
The patient has been re-examined, and the chart reviewed, and there have been no interval changes to the documented history and physical.    The risks, benefits, and alternatives have been discussed at length, and the patient is willing to proceed.   

## 2017-02-14 NOTE — Anesthesia Postprocedure Evaluation (Signed)
Anesthesia Post Note  Patient: MEREDETH MELLEMA  Procedure(s) Performed: Procedure(s) (LRB): KNEE ARTHROSCOPY WITH ANTERIOR CRUCIATE LIGAMENT (ACL) REPAIR WITH HAMSTRING GRAFT (Right)  Patient location during evaluation: PACU Anesthesia Type: General Level of consciousness: awake and alert Pain management: pain level controlled Vital Signs Assessment: post-procedure vital signs reviewed and stable Respiratory status: spontaneous breathing, nonlabored ventilation, respiratory function stable and patient connected to nasal cannula oxygen Cardiovascular status: blood pressure returned to baseline and stable Postop Assessment: no signs of nausea or vomiting Anesthetic complications: no     Last Vitals:  Vitals:   02/14/17 1307 02/14/17 1343  BP: 129/77 121/78  Pulse: 85 80  Resp:    Temp:      Last Pain:  Vitals:   02/14/17 1343  TempSrc:   PainSc: 5                  Precious Haws Piscitello

## 2017-02-14 NOTE — Anesthesia Preprocedure Evaluation (Addendum)
Anesthesia Evaluation  Patient identified by MRN, date of birth, ID band Patient awake    Reviewed: Allergy & Precautions, H&P , NPO status , Patient's Chart, lab work & pertinent test results  History of Anesthesia Complications Negative for: history of anesthetic complications  Airway Mallampati: III  TM Distance: >3 FB Neck ROM: full    Dental  (+) Poor Dentition, Chipped, Caps   Pulmonary neg shortness of breath, Current Smoker,    Pulmonary exam normal breath sounds clear to auscultation       Cardiovascular Exercise Tolerance: Good (-) angina+ Past MI  (-) DOE negative cardio ROS Normal cardiovascular exam Rhythm:regular Rate:Normal     Neuro/Psych  Headaches, negative psych ROS   GI/Hepatic negative GI ROS, Neg liver ROS,   Endo/Other  diabetes, Type 2  Renal/GU      Musculoskeletal   Abdominal   Peds  Hematology negative hematology ROS (+)   Anesthesia Other Findings Past Medical History: No date: Acanthosis nigricans No date: Anemia No date: Diabetes mellitus without complication (HCC)     Comment: patient unaware No date: Fibroids No date: Fibroids No date: Headache No date: Iron deficiency anemia  Past Surgical History: 05/29/2015: ABDOMINAL HYSTERECTOMY     Comment: Procedure: HYSTERECTOMY ABDOMINAL;  Surgeon:               Ruffin Frederick, MD;  Location: [facility];              Service: Gynecology;; No date: CHOLECYSTECTOMY No date: KNEE ARTHROSCOPY Right 05/29/2015: LAPAROSCOPIC SUPRACERVICAL HYSTERECTOMY N/A     Comment: Procedure: LAPAROSCOPIC SUPRACERVICAL               HYSTERECTOMY;  Surgeon: Ruffin Frederick,               MD;  Location: [facility] OR[facility];  Service: Gynecology;               Laterality: N/A;  attempted No date: TUBAL LIGATION  BMI    Body Mass Index:  42.05 kg/m      Reproductive/Obstetrics negative OB ROS                              Anesthesia Physical Anesthesia Plan  ASA: III  Anesthesia Plan: General ETT   Post-op Pain Management:    Induction:   Airway Management Planned:   Additional Equipment:   Intra-op Plan:   Post-operative Plan:   Informed Consent: I have reviewed the patients History and Physical, chart, labs and discussed the procedure including the risks, benefits and alternatives for the proposed anesthesia with the patient or authorized representative who has indicated his/her understanding and acceptance.   Dental Advisory Given  Plan Discussed with: Anesthesiologist, CRNA and Surgeon  Anesthesia Plan Comments:        Anesthesia Quick Evaluation

## 2017-02-14 NOTE — Anesthesia Post-op Follow-up Note (Cosign Needed)
Anesthesia QCDR form completed.        

## 2017-02-15 ENCOUNTER — Encounter: Payer: Self-pay | Admitting: Orthopedic Surgery

## 2017-02-20 ENCOUNTER — Telehealth: Payer: Self-pay | Admitting: Family Medicine

## 2017-02-21 MED ORDER — DOCUSATE SODIUM 100 MG PO CAPS: 100.0000 mg | ORAL_CAPSULE | Freq: Two times a day (BID) | ORAL | 0 refills | Status: DC

## 2017-02-21 NOTE — Telephone Encounter (Signed)
Rx sent to her pharmacy. This is likely due to her post-op pain medication

## 2017-02-22 ENCOUNTER — Ambulatory Visit: Payer: Commercial Managed Care - PPO | Admitting: Family Medicine

## 2017-03-22 DIAGNOSIS — S83519A Sprain of anterior cruciate ligament of unspecified knee, initial encounter: Secondary | ICD-10-CM | POA: Insufficient documentation

## 2017-05-02 ENCOUNTER — Ambulatory Visit (INDEPENDENT_AMBULATORY_CARE_PROVIDER_SITE_OTHER): Payer: Commercial Managed Care - PPO | Admitting: Family Medicine

## 2017-05-02 ENCOUNTER — Encounter: Payer: Self-pay | Admitting: Family Medicine

## 2017-05-02 VITALS — BP 122/82 | HR 85 | Temp 98.6°F | Wt 253.9 lb

## 2017-05-02 DIAGNOSIS — M545 Low back pain, unspecified: Secondary | ICD-10-CM

## 2017-05-02 MED ORDER — CYCLOBENZAPRINE HCL 10 MG PO TABS: 10.0000 mg | ORAL_TABLET | Freq: Every day | ORAL | 0 refills | Status: DC

## 2017-05-02 MED ORDER — NAPROXEN 500 MG PO TABS: 500.0000 mg | ORAL_TABLET | Freq: Two times a day (BID) | ORAL | 0 refills | Status: DC

## 2017-05-02 NOTE — Progress Notes (Signed)
BP 122/82 (BP Location: Left Arm, Patient Position: Sitting, Cuff Size: Large)   Pulse 85   Temp 98.6 F (37 C)   Wt 253 lb 14.4 oz (115.2 kg)   LMP  (LMP Unknown)   SpO2 98%   BMI 43.58 kg/m    Subjective:    Patient ID: [name], female    DOB: 1971/10/03, 46 y.o.   MRN: [id number]  HPI: GWENEVERE GOGA is a 46 y.o. female  Chief Complaint  Patient presents with  . Back Pain    Patient coughed last night and pulled something in her back   BACK PAIN- had a coughing spell last night and felt something move Duration: last night Mechanism of injury: coughing Location: R middle to upper back under her bra strap Onset: sudden Severity: 8/10 Quality: sharp, pinching Frequency: constant Radiation: none Aggravating factors: reaching, leaning over, wiping herself Alleviating factors: nothing Status: worse Treatments attempted: tiger balm   Relief with NSAIDs?: no Nighttime pain:  no Paresthesias / decreased sensation:  no Bowel / bladder incontinence:  no Fevers:  no Dysuria / urinary frequency:  no  Relevant past medical, surgical, family and social history reviewed and updated as indicated. Interim medical history since our last visit reviewed. Allergies and medications reviewed and updated.  Review of Systems  Constitutional: Negative.   Respiratory: Negative.   Musculoskeletal: Positive for arthralgias, back pain, gait problem and myalgias. Negative for joint swelling, neck pain and neck stiffness.  Psychiatric/Behavioral: Negative.     Per HPI unless specifically indicated above     Objective:    BP 122/82 (BP Location: Left Arm, Patient Position: Sitting, Cuff Size: Large)   Pulse 85   Temp 98.6 F (37 C)   Wt 253 lb 14.4 oz (115.2 kg)   LMP  (LMP Unknown)   SpO2 98%   BMI 43.58 kg/m   Wt Readings from Last 3 Encounters:  05/02/17 253 lb 14.4 oz (115.2 kg)  02/14/17 245 lb (111.1 kg)  02/01/17 245 lb 3.2 oz (111.2 kg)    Physical Exam    Constitutional: She is oriented to person, place, and time. She appears well-developed and well-nourished. No distress.  HENT:  Head: Normocephalic and atraumatic.  Right Ear: Hearing normal.  Left Ear: Hearing normal.  Nose: Nose normal.  Eyes: Conjunctivae and lids are normal. Right eye exhibits no discharge. Left eye exhibits no discharge. No scleral icterus.  Cardiovascular: Normal rate, regular rhythm, normal heart sounds and intact distal pulses.  Exam reveals no gallop and no friction rub.   No murmur heard. Pulmonary/Chest: Effort normal and breath sounds normal. No respiratory distress. She has no wheezes. She has no rales. She exhibits no tenderness.  Musculoskeletal: Normal range of motion.  Neurological: She is alert and oriented to person, place, and time.  Skin: Skin is warm, dry and intact. No rash noted. No erythema. No pallor.  Psychiatric: She has a normal mood and affect. Her speech is normal and behavior is normal. Judgment and thought content normal. Cognition and memory are normal.  Nursing note and vitals reviewed.  Back Exam:    Inspection:  Normal spinal curvature.  No deformity, ecchymosis, erythema, or lesions     Palpation:     Midline spinal tenderness: no      Paralumbar tenderness: yes Right     Parathoracic tenderness: yes Right     Buttocks tenderness: no     Range of Motion:  Flexion: decreased     Extension:Decreased     Lateral bending:Decreased    Rotation:Decreased    Neuro Exam:Lower extremity DTRs normal & symmetric.  Strength and sensation intact.    Special Tests:      Straight leg raise:negative  Results for orders placed or performed during the hospital encounter of 02/14/17  Glucose, capillary  Result Value Ref Range   Glucose-Capillary 108 (H) 65 - 99 mg/dL      Assessment & Plan:   Problem List Items Addressed This Visit    None    Visit Diagnoses    Acute right-sided low back pain without sciatica    -  Primary   Will  start flexeril and naproxen, hold meloxicam for 2 weeks, and stretches. Call if not getting better. Recheck 2 weeks.    Relevant Medications   meloxicam (MOBIC) 7.5 MG tablet   cyclobenzaprine (FLEXERIL) 10 MG tablet   naproxen (NAPROSYN) 500 MG tablet       Follow up plan: Return in about 2 weeks (around 05/16/2017) for Follow up back.

## 2017-05-02 NOTE — Patient Instructions (Addendum)

## 2017-08-29 ENCOUNTER — Ambulatory Visit (INDEPENDENT_AMBULATORY_CARE_PROVIDER_SITE_OTHER): Payer: Commercial Managed Care - PPO | Admitting: Family Medicine

## 2017-08-29 ENCOUNTER — Encounter: Payer: Self-pay | Admitting: Family Medicine

## 2017-08-29 VITALS — BP 139/86 | HR 98 | Temp 98.7°F | Ht 64.0 in | Wt 257.1 lb

## 2017-08-29 DIAGNOSIS — D500 Iron deficiency anemia secondary to blood loss (chronic): Secondary | ICD-10-CM

## 2017-08-29 DIAGNOSIS — R7301 Impaired fasting glucose: Secondary | ICD-10-CM | POA: Diagnosis not present

## 2017-08-29 DIAGNOSIS — Z13220 Encounter for screening for lipoid disorders: Secondary | ICD-10-CM | POA: Diagnosis not present

## 2017-08-29 DIAGNOSIS — B3731 Acute candidiasis of vulva and vagina: Secondary | ICD-10-CM

## 2017-08-29 DIAGNOSIS — Z0000 Encounter for general adult medical examination without abnormal findings: Secondary | ICD-10-CM | POA: Diagnosis not present

## 2017-08-29 DIAGNOSIS — Z1239 Encounter for other screening for malignant neoplasm of breast: Secondary | ICD-10-CM

## 2017-08-29 DIAGNOSIS — N760 Acute vaginitis: Secondary | ICD-10-CM

## 2017-08-29 DIAGNOSIS — B373 Candidiasis of vulva and vagina: Secondary | ICD-10-CM

## 2017-08-29 DIAGNOSIS — A599 Trichomoniasis, unspecified: Secondary | ICD-10-CM

## 2017-08-29 DIAGNOSIS — Z1231 Encounter for screening mammogram for malignant neoplasm of breast: Secondary | ICD-10-CM | POA: Diagnosis not present

## 2017-08-29 DIAGNOSIS — Z23 Encounter for immunization: Secondary | ICD-10-CM | POA: Diagnosis not present

## 2017-08-29 DIAGNOSIS — L298 Other pruritus: Secondary | ICD-10-CM | POA: Diagnosis not present

## 2017-08-29 DIAGNOSIS — B9689 Other specified bacterial agents as the cause of diseases classified elsewhere: Secondary | ICD-10-CM | POA: Diagnosis not present

## 2017-08-29 DIAGNOSIS — N898 Other specified noninflammatory disorders of vagina: Secondary | ICD-10-CM

## 2017-08-29 LAB — WET PREP FOR TRICH, YEAST, CLUE
CLUE CELL EXAM: POSITIVE — AB
Trichomonas Exam: POSITIVE — AB
Yeast Exam: POSITIVE — AB

## 2017-08-29 LAB — UA/M W/RFLX CULTURE, ROUTINE
BILIRUBIN UA: NEGATIVE
Glucose, UA: NEGATIVE
KETONES UA: NEGATIVE
Nitrite, UA: NEGATIVE
PH UA: 5.5 (ref 5.0–7.5)
PROTEIN UA: NEGATIVE
Specific Gravity, UA: 1.025 (ref 1.005–1.030)
Urobilinogen, Ur: 0.2 mg/dL (ref 0.2–1.0)

## 2017-08-29 LAB — MICROALBUMIN, URINE WAIVED
Creatinine, Urine Waived: 300 mg/dL (ref 10–300)
MICROALB, UR WAIVED: 30 mg/L — AB (ref 0–19)

## 2017-08-29 LAB — MICROSCOPIC EXAMINATION: RBC MICROSCOPIC, UA: NONE SEEN /HPF (ref 0–?)

## 2017-08-29 MED ORDER — METRONIDAZOLE 500 MG PO TABS: 500.0000 mg | ORAL_TABLET | Freq: Two times a day (BID) | ORAL | 0 refills | Status: DC

## 2017-08-29 MED ORDER — FLUCONAZOLE 150 MG PO TABS: 150.0000 mg | ORAL_TABLET | Freq: Once | ORAL | 0 refills | Status: AC

## 2017-08-29 NOTE — Assessment & Plan Note (Signed)
Checking A1c today, has been doing well. Await results.

## 2017-08-29 NOTE — Patient Instructions (Addendum)
Health Maintenance, Female Adopting a healthy lifestyle and getting preventive care can go a long way to promote health and wellness. Talk with your health care provider about what schedule of regular examinations is right for you. This is a good chance for you to check in with your provider about disease prevention and staying healthy. In between checkups, there are plenty of things you can do on your own. Experts have done a lot of research about which lifestyle changes and preventive measures are most likely to keep you healthy. Ask your health care provider for more information. Weight and diet Eat a healthy diet  Be sure to include plenty of vegetables, fruits, low-fat dairy products, and lean protein.  Do not eat a lot of foods high in solid fats, added sugars, or salt.  Get regular exercise. This is one of the most important things you can do for your health. ? Most adults should exercise for at least 150 minutes each week. The exercise should increase your heart rate and make you sweat (moderate-intensity exercise). ? Most adults should also do strengthening exercises at least twice a week. This is in addition to the moderate-intensity exercise.  Maintain a healthy weight  Body mass index (BMI) is a measurement that can be used to identify possible weight problems. It estimates body fat based on height and weight. Your health care provider can help determine your BMI and help you achieve or maintain a healthy weight.  For females 20 years of age and older: ? A BMI below 18.5 is considered underweight. ? A BMI of 18.5 to 24.9 is normal. ? A BMI of 25 to 29.9 is considered overweight. ? A BMI of 30 and above is considered obese.  Watch levels of cholesterol and blood lipids  You should start having your blood tested for lipids and cholesterol at 45 years of age, then have this test every 5 years.  You may need to have your cholesterol levels checked more often if: ? Your lipid or  cholesterol levels are high. ? You are older than 45 years of age. ? You are at high risk for heart disease.  Cancer screening Lung Cancer  Lung cancer screening is recommended for adults 55-80 years old who are at high risk for lung cancer because of a history of smoking.  A yearly low-dose CT scan of the lungs is recommended for people who: ? Currently smoke. ? Have quit within the past 15 years. ? Have at least a 30-pack-year history of smoking. A pack year is smoking an average of one pack of cigarettes a day for 1 year.  Yearly screening should continue until it has been 15 years since you quit.  Yearly screening should stop if you develop a health problem that would prevent you from having lung cancer treatment.  Breast Cancer  Practice breast self-awareness. This means understanding how your breasts normally appear and feel.  It also means doing regular breast self-exams. Let your health care provider know about any changes, no matter how small.  If you are in your 20s or 30s, you should have a clinical breast exam (CBE) by a health care provider every 1-3 years as part of a regular health exam.  If you are 40 or older, have a CBE every year. Also consider having a breast X-ray (mammogram) every year.  If you have a family history of breast cancer, talk to your health care provider about genetic screening.  If you are at high risk   for breast cancer, talk to your health care provider about having an MRI and a mammogram every year.  Breast cancer gene (BRCA) assessment is recommended for women who have family members with BRCA-related cancers. BRCA-related cancers include: ? Breast. ? Ovarian. ? Tubal. ? Peritoneal cancers.  Results of the assessment will determine the need for genetic counseling and BRCA1 and BRCA2 testing.  Cervical Cancer Your health care provider may recommend that you be screened regularly for cancer of the pelvic organs (ovaries, uterus, and  vagina). This screening involves a pelvic examination, including checking for microscopic changes to the surface of your cervix (Pap test). You may be encouraged to have this screening done every 3 years, beginning at age 22.  For women ages 56-65, health care providers may recommend pelvic exams and Pap testing every 3 years, or they may recommend the Pap and pelvic exam, combined with testing for human papilloma virus (HPV), every 5 years. Some types of HPV increase your risk of cervical cancer. Testing for HPV may also be done on women of any age with unclear Pap test results.  Other health care providers may not recommend any screening for nonpregnant women who are considered low risk for pelvic cancer and who do not have symptoms. Ask your health care provider if a screening pelvic exam is right for you.  If you have had past treatment for cervical cancer or a condition that could lead to cancer, you need Pap tests and screening for cancer for at least 20 years after your treatment. If Pap tests have been discontinued, your risk factors (such as having a new sexual partner) need to be reassessed to determine if screening should resume. Some women have medical problems that increase the chance of getting cervical cancer. In these cases, your health care provider may recommend more frequent screening and Pap tests.  Colorectal Cancer  This type of cancer can be detected and often prevented.  Routine colorectal cancer screening usually begins at 45 years of age and continues through 45 years of age.  Your health care provider may recommend screening at an earlier age if you have risk factors for colon cancer.  Your health care provider may also recommend using home test kits to check for hidden blood in the stool.  A small camera at the end of a tube can be used to examine your colon directly (sigmoidoscopy or colonoscopy). This is done to check for the earliest forms of colorectal  cancer.  Routine screening usually begins at age 33.  Direct examination of the colon should be repeated every 5-10 years through 45 years of age. However, you may need to be screened more often if early forms of precancerous polyps or small growths are found.  Skin Cancer  Check your skin from head to toe regularly.  Tell your health care provider about any new moles or changes in moles, especially if there is a change in a mole's shape or color.  Also tell your health care provider if you have a mole that is larger than the size of a pencil eraser.  Always use sunscreen. Apply sunscreen liberally and repeatedly throughout the day.  Protect yourself by wearing long sleeves, pants, a wide-brimmed hat, and sunglasses whenever you are outside.  Heart disease, diabetes, and high blood pressure  High blood pressure causes heart disease and increases the risk of stroke. High blood pressure is more likely to develop in: ? People who have blood pressure in the high end of  the normal range (130-139/85-89 mm Hg). ? People who are overweight or obese. ? People who are African American.  If you are 21-29 years of age, have your blood pressure checked every 3-5 years. If you are 3 years of age or older, have your blood pressure checked every year. You should have your blood pressure measured twice-once when you are at a hospital or clinic, and once when you are not at a hospital or clinic. Record the average of the two measurements. To check your blood pressure when you are not at a hospital or clinic, you can use: ? An automated blood pressure machine at a pharmacy. ? A home blood pressure monitor.  If you are between 17 years and 37 years old, ask your health care provider if you should take aspirin to prevent strokes.  Have regular diabetes screenings. This involves taking a blood sample to check your fasting blood sugar level. ? If you are at a normal weight and have a low risk for diabetes,  have this test once every three years after 45 years of age. ? If you are overweight and have a high risk for diabetes, consider being tested at a younger age or more often. Preventing infection Hepatitis B  If you have a higher risk for hepatitis B, you should be screened for this virus. You are considered at high risk for hepatitis B if: ? You were born in a country where hepatitis B is common. Ask your health care provider which countries are considered high risk. ? Your parents were born in a high-risk country, and you have not been immunized against hepatitis B (hepatitis B vaccine). ? You have HIV or AIDS. ? You use needles to inject street drugs. ? You live with someone who has hepatitis B. ? You have had sex with someone who has hepatitis B. ? You get hemodialysis treatment. ? You take certain medicines for conditions, including cancer, organ transplantation, and autoimmune conditions.  Hepatitis C  Blood testing is recommended for: ? Everyone born from [birth year] through 1964. ? Anyone with known risk factors for hepatitis C.  Sexually transmitted infections (STIs)  You should be screened for sexually transmitted infections (STIs) including gonorrhea and chlamydia if: ? You are sexually active and are younger than 45 years of age. ? You are older than 45 years of age and your health care provider tells you that you are at risk for this type of infection. ? Your sexual activity has changed since you were last screened and you are at an increased risk for chlamydia or gonorrhea. Ask your health care provider if you are at risk.  If you do not have HIV, but are at risk, it may be recommended that you take a prescription medicine daily to prevent HIV infection. This is called pre-exposure prophylaxis (PrEP). You are considered at risk if: ? You are sexually active and do not regularly use condoms or know the HIV status of your partner(s). ? You take drugs by injection. ? You are  sexually active with a partner who has HIV.  Talk with your health care provider about whether you are at high risk of being infected with HIV. If you choose to begin PrEP, you should first be tested for HIV. You should then be tested every 3 months for as long as you are taking PrEP. Pregnancy  If you are premenopausal and you may become pregnant, ask your health care provider about preconception counseling.  If you may become  pregnant, take 400 to 800 micrograms (mcg) of folic acid every day.  If you want to prevent pregnancy, talk to your health care provider about birth control (contraception). Osteoporosis and menopause  Osteoporosis is a disease in which the bones lose minerals and strength with aging. This can result in serious bone fractures. Your risk for osteoporosis can be identified using a bone density scan.  If you are 47 years of age or older, or if you are at risk for osteoporosis and fractures, ask your health care provider if you should be screened.  Ask your health care provider whether you should take a calcium or vitamin D supplement to lower your risk for osteoporosis.  Menopause may have certain physical symptoms and risks.  Hormone replacement therapy may reduce some of these symptoms and risks. Talk to your health care provider about whether hormone replacement therapy is right for you. Follow these instructions at home:  Schedule regular health, dental, and eye exams.  Stay current with your immunizations.  Do not use any tobacco products including cigarettes, chewing tobacco, or electronic cigarettes.  If you are pregnant, do not drink alcohol.  If you are breastfeeding, limit how much and how often you drink alcohol.  Limit alcohol intake to no more than 1 drink per day for nonpregnant women. One drink equals 12 ounces of beer, 5 ounces of wine, or 1 ounces of hard liquor.  Do not use street drugs.  Do not share needles.  Ask your health care  provider for help if you need support or information about quitting drugs.  Tell your health care provider if you often feel depressed.  Tell your health care provider if you have ever been abused or do not feel safe at home. This information is not intended to replace advice given to you by your health care provider. Make sure you discuss any questions you have with your health care provider. Document Released: 06/27/2011 Document Revised: 05/19/2016 Document Reviewed: 09/15/2015 Elsevier Interactive Patient Education  2018 [place]. Pneumococcal Vaccine, Polyvalent solution for injection What is this medicine? PNEUMOCOCCAL VACCINE, POLYVALENT (NEU mo KOK al vak SEEN, pol ee VEY luhnt) is a vaccine to prevent pneumococcus bacteria infection. These bacteria are a major cause of ear infections, Strep throat infections, and serious pneumonia, meningitis, or blood infections worldwide. These vaccines help the body to produce antibodies (protective substances) that help your body defend against these bacteria. This vaccine is recommended for people 70 years of age and older with health problems. It is also recommended for all adults over 70 years old. This vaccine will not treat an infection. This medicine may be used for other purposes; ask your health care provider or pharmacist if you have questions. COMMON BRAND NAME(S): Pneumovax 23 What should I tell my health care provider before I take this medicine? They need to know if you have any of these conditions: -bleeding problems -bone marrow or organ transplant -cancer, Hodgkin's disease -fever -infection -immune system problems -low platelet count in the blood -seizures -an unusual or allergic reaction to pneumococcal vaccine, diphtheria toxoid, other vaccines, latex, other medicines, foods, dyes, or preservatives -pregnant or trying to get pregnant -breast-feeding How should I use this medicine? This vaccine is for injection into a  muscle or under the skin. It is given by a health care professional. A copy of Vaccine Information Statements will be given before each vaccination. Read this sheet carefully each time. The sheet may change frequently. Talk to your pediatrician regarding  the use of this medicine in children. While this drug may be prescribed for children as young as 47 years of age for selected conditions, precautions do apply. Overdosage: If you think you have taken too much of this medicine contact a poison control center or emergency room at once. NOTE: This medicine is only for you. Do not share this medicine with others. What if I miss a dose? It is important not to miss your dose. Call your doctor or health care professional if you are unable to keep an appointment. What may interact with this medicine? -medicines for cancer chemotherapy -medicines that suppress your immune function -medicines that treat or prevent blood clots like warfarin, enoxaparin, and dalteparin -steroid medicines like prednisone or cortisone This list may not describe all possible interactions. Give your health care provider a list of all the medicines, herbs, non-prescription drugs, or dietary supplements you use. Also tell them if you smoke, drink alcohol, or use illegal drugs. Some items may interact with your medicine. What should I watch for while using this medicine? Mild fever and pain should go away in 3 days or less. Report any unusual symptoms to your doctor or health care professional. What side effects may I notice from receiving this medicine? Side effects that you should report to your doctor or health care professional as soon as possible: -allergic reactions like skin rash, itching or hives, swelling of the face, lips, or tongue -breathing problems -confused -fever over 102 degrees F -pain, tingling, numbness in the hands or feet -seizures -unusual bleeding or bruising -unusual muscle weakness Side effects that  usually do not require medical attention (report to your doctor or health care professional if they continue or are bothersome): -aches and pains -diarrhea -fever of 102 degrees F or less -headache -irritable -loss of appetite -pain, tender at site where injected -trouble sleeping This list may not describe all possible side effects. Call your doctor for medical advice about side effects. You may report side effects to FDA at 1-800-FDA-1088. Where should I keep my medicine? This does not apply. This vaccine is given in a clinic, pharmacy, doctor's office, or other health care setting and will not be stored at home. NOTE: This sheet is a summary. It may not cover all possible information. If you have questions about this medicine, talk to your doctor, pharmacist, or health care provider.  2018 Elsevier/Gold Standard (2008-07-18 14:32:37) Influenza (Flu) Vaccine (Inactivated or Recombinant): What You Need to Know 1. Why get vaccinated? Influenza ("flu") is a contagious disease that spreads around the [place] every year, usually between October and May. Flu is caused by influenza viruses, and is spread mainly by coughing, sneezing, and close contact. Anyone can get flu. Flu strikes suddenly and can last several days. Symptoms vary by age, but can include:  fever/chills  sore throat  muscle aches  fatigue  cough  headache  runny or stuffy nose  Flu can also lead to pneumonia and blood infections, and cause diarrhea and seizures in children. If you have a medical condition, such as heart or lung disease, flu can make it worse. Flu is more dangerous for some people. Infants and young children, people 77 years of age and older, pregnant women, and people with certain health conditions or a weakened immune system are at greatest risk. Each year thousands of people in the [place] die from flu, and many more are hospitalized. Flu vaccine can:  keep you from getting  flu,  make flu  less severe if you do get it, and  keep you from spreading flu to your family and other people. 2. Inactivated and recombinant flu vaccines A dose of flu vaccine is recommended every flu season. Children 6 months through 39 years of age may need two doses during the same flu season. Everyone else needs only one dose each flu season. Some inactivated flu vaccines contain a very small amount of a mercury-based preservative called thimerosal. Studies have not shown thimerosal in vaccines to be harmful, but flu vaccines that do not contain thimerosal are available. There is no live flu virus in flu shots. They cannot cause the flu. There are many flu viruses, and they are always changing. Each year a new flu vaccine is made to protect against three or four viruses that are likely to cause disease in the upcoming flu season. But even when the vaccine doesn't exactly match these viruses, it may still provide some protection. Flu vaccine cannot prevent:  flu that is caused by a virus not covered by the vaccine, or  illnesses that look like flu but are not.  It takes about 2 weeks for protection to develop after vaccination, and protection lasts through the flu season. 3. Some people should not get this vaccine Tell the person who is giving you the vaccine:  If you have any severe, life-threatening allergies. If you ever had a life-threatening allergic reaction after a dose of flu vaccine, or have a severe allergy to any part of this vaccine, you may be advised not to get vaccinated. Most, but not all, types of flu vaccine contain a small amount of egg protein.  If you ever had Guillain-Barr Syndrome (also called GBS). Some people with a history of GBS should not get this vaccine. This should be discussed with your doctor.  If you are not feeling well. It is usually okay to get flu vaccine when you have a mild illness, but you might be asked to come back when you feel better.  4.  Risks of a vaccine reaction With any medicine, including vaccines, there is a chance of reactions. These are usually mild and go away on their own, but serious reactions are also possible. Most people who get a flu shot do not have any problems with it. Minor problems following a flu shot include:  soreness, redness, or swelling where the shot was given  hoarseness  sore, red or itchy eyes  cough  fever  aches  headache  itching  fatigue  If these problems occur, they usually begin soon after the shot and last 1 or 2 days. More serious problems following a flu shot can include the following:  There may be a small increased risk of Guillain-Barre Syndrome (GBS) after inactivated flu vaccine. This risk has been estimated at 1 or 2 additional cases per million people vaccinated. This is much lower than the risk of severe complications from flu, which can be prevented by flu vaccine.  Young children who get the flu shot along with pneumococcal vaccine (PCV13) and/or DTaP vaccine at the same time might be slightly more likely to have a seizure caused by fever. Ask your doctor for more information. Tell your doctor if a child who is getting flu vaccine has ever had a seizure.  Problems that could happen after any injected vaccine:  People sometimes faint after a medical procedure, including vaccination. Sitting or lying down for about 15 minutes can help prevent fainting, and injuries caused by a fall.  Tell your doctor if you feel dizzy, or have vision changes or ringing in the ears.  Some people get severe pain in the shoulder and have difficulty moving the arm where a shot was given. This happens very rarely.  Any medication can cause a severe allergic reaction. Such reactions from a vaccine are very rare, estimated at about 1 in a million doses, and would happen within a few minutes to a few hours after the vaccination. As with any medicine, there is a very remote chance of a  vaccine causing a serious injury or death. The safety of vaccines is always being monitored. For more information, visit: http://floyd.org/ 5. What if there is a serious reaction? What should I look for? Look for anything that concerns you, such as signs of a severe allergic reaction, very high fever, or unusual behavior. Signs of a severe allergic reaction can include hives, swelling of the face and throat, difficulty breathing, a fast heartbeat, dizziness, and weakness. These would start a few minutes to a few hours after the vaccination. What should I do?  If you think it is a severe allergic reaction or other emergency that can't wait, call 9-1-1 and get the person to the nearest hospital. Otherwise, call your doctor.  Reactions should be reported to the Vaccine Adverse Event Reporting System (VAERS). Your doctor should file this report, or you can do it yourself through the VAERS web site at www.vaers.LAgents.no, or by calling 1-905-240-3736. ? VAERS does not give medical advice. 6. The National Vaccine Injury Compensation Program The [employer] Vaccine Injury Compensation Program (VICP) is a federal program that was created to compensate people who may have been injured by certain vaccines. Persons who believe they may have been injured by a vaccine can learn about the program and about filing a claim by calling 1-757-194-5237 or visiting the VICP website at SpiritualWord.at. There is a time limit to file a claim for compensation. 7. How can I learn more?  Ask your healthcare provider. He or she can give you the vaccine package insert or suggest other sources of information.  Call your local or state health department.  Contact the Centers for Disease Control and Prevention (CDC): ? Call 646-186-7660 (1-800-CDC-INFO) or ? Visit CDC's website at BiotechRoom.com.cy Vaccine Information Statement, Inactivated Influenza Vaccine (08/01/2014) This information is not  intended to replace advice given to you by your health care provider. Make sure you discuss any questions you have with your health care provider. Document Released: 10/06/2006 Document Revised: 09/01/2016 Document Reviewed: 09/01/2016 Elsevier Interactive Patient Education  2017 [employer].  Trichomoniasis Trichomoniasis is an STI (sexually transmitted infection) that can affect both women and men. In women, the outer area of the female genitalia (vulva) and the vagina are affected. In men, the penis is mainly affected, but the prostate and other reproductive organs can also be involved. This condition can be treated with medicine. It often has no symptoms (is asymptomatic), especially in men. What are the causes? This condition is caused by an organism called Trichomonas vaginalis. Trichomoniasis most often spreads from person to person (is contagious) through sexual contact. What increases the risk? The following factors may make you more likely to develop this condition:  Having unprotected sexual intercourse.  Having sexual intercourse with a partner who has trichomoniasis.  Having multiple sexual partners.  Having had previous trichomoniasis infections or other STIs.  What are the signs or symptoms? In women, symptoms of trichomoniasis include:  Abnormal vaginal discharge that is clear,  white, gray, or yellow-green and foamy and has an unusual "fishy" odor.  Itching and irritation of the vagina and vulva.  Burning or pain during urination or sexual intercourse.  Genital redness and swelling.  In men, symptoms of trichomoniasis include:  Penile discharge that may be foamy or contain pus.  Pain in the penis. This may happen only when urinating.  Itching or irritation inside the penis.  Burning after urination or ejaculation.  How is this diagnosed? In women, this condition may be found during a routine Pap test or physical exam. It may be found in men during a routine  physical exam. Your health care provider may perform tests to help diagnose this infection, such as:  Urine tests (men and women).  The following in women: ? Testing the pH of the vagina. ? A vaginal swab test that checks for the Trichomonas vaginalis organism. ? Testing vaginal secretions.  Your health care provider may test you for other STIs, including HIV (human immunodeficiency virus). How is this treated? This condition is treated with medicine taken by mouth (orally), such as metronidazole or tinidazole to fight the infection. Your sexual partner(s) may also need to be tested and treated.  If you are a woman and you plan to become pregnant or think you may be pregnant, tell your health care provider right away. Some medicines that are used to treat the infection should not be taken during pregnancy.  Your health care provider may recommend over-the-counter medicines or creams to help relieve itching or irritation. You may be tested for infection again 3 months after treatment. Follow these instructions at home:  Take and use over-the-counter and prescription medicines, including creams, only as told by your health care provider.  Do not have sexual intercourse until one week after you finish your medicine, or until your health care provider approves. Ask your health care provider when you may resume sexual intercourse.  (Women) Do not douche or wear tampons while you have the infection.  Discuss your infection with your sexual partner(s). Make sure that your partner gets tested and treated, if necessary.  Keep all follow-up visits as told by your health care provider. This is important. How is this prevented?  Use condoms every time you have sex. Using condoms correctly and consistently can help protect against STIs.  Avoid having multiple sexual partners.  Talk with your sexual partner about any symptoms that either of you may have, as well as any history of STIs.  Get  tested for STIs and STDs (sexually transmitted diseases) before you have sex. Ask your partner to do the same.  Do not have sexual contact if you have symptoms of trichomoniasis or another STI. Contact a health care provider if:  You still have symptoms after you finish your medicine.  You develop pain in your abdomen.  You have pain when you urinate.  You have bleeding after sexual intercourse.  You develop a rash.  You feel nauseous or you vomit.  You plan to become pregnant or think you may be pregnant. Summary  Trichomoniasis is an STI (sexually transmitted infection) that can affect both women and men.  This condition often has no symptoms (is asymptomatic), especially in men.  You should not have sexual intercourse until one week after you finish your medicine, or until your health care provider approves. Ask your health care provider when you may resume sexual intercourse.  Discuss your infection with your sexual partner. Make sure that your partner gets tested  and treated, if necessary. This information is not intended to replace advice given to you by your health care provider. Make sure you discuss any questions you have with your health care provider. Document Released: 06/07/2001 Document Revised: 11/04/2016 Document Reviewed: 11/04/2016 Elsevier Interactive Patient Education  2017 [employer].

## 2017-08-29 NOTE — Progress Notes (Signed)
BP 139/86 (BP Location: Left Arm, Patient Position: Sitting, Cuff Size: Large)   Pulse 98   Temp 98.7 F (37.1 C)   Ht 5\' 4"  (1.626 m)   Wt 257 lb 1 oz (116.6 kg)   LMP  (LMP Unknown)   SpO2 99%   BMI 44.12 kg/m    Subjective:    Patient ID: Amanda Moran, female    DOB: 01/05/1971, 46 y.o.   MRN: [id number]  HPI: Amanda Moran is a 46 y.o. female presenting on 08/29/2017 for comprehensive medical examination. Current medical complaints include:none  Menopausal Symptoms: yes- vaginal driness, a bit itchy  Depression Screen done today and results listed below:  Depression screen [facility] 2/9 08/29/2017 08/22/2016  Decreased Interest 0 0  Down, Depressed, Hopeless 0 0  PHQ - 2 Score 0 0    Past Medical History:  Past Medical History:  Diagnosis Date  . Acanthosis nigricans   . Anemia   . Diabetes mellitus without complication [facility])    patient unaware  . Fibroids   . Fibroids   . Headache   . Iron deficiency anemia     Surgical History:  Past Surgical History:  Procedure Laterality Date  . ABDOMINAL HYSTERECTOMY  05/29/2015   Procedure: HYSTERECTOMY ABDOMINAL;  Surgeon: Ruffin Frederick, MD;  Location: [facility] ORS;  Service: Gynecology;;  . Lorin Mercy    . KNEE ARTHROSCOPY Right   . KNEE ARTHROSCOPY WITH ANTERIOR CRUCIATE LIGAMENT (ACL) REPAIR WITH HAMSTRING GRAFT Right 02/14/2017   Procedure: KNEE ARTHROSCOPY WITH ANTERIOR CRUCIATE LIGAMENT (ACL) REPAIR WITH HAMSTRING GRAFT;  Surgeon: Thornton Park, MD;  Location: [facility] ORS;  Service: Orthopedics;  Laterality: Right;  . LAPAROSCOPIC SUPRACERVICAL HYSTERECTOMY N/A 05/29/2015   Procedure: LAPAROSCOPIC SUPRACERVICAL HYSTERECTOMY;  Surgeon: Ruffin Frederick, MD;  Location: [facility] ORS;  Service: Gynecology;  Laterality: N/A;  attempted  . TUBAL LIGATION      Medications:  Current Outpatient Prescriptions on File Prior to Visit  Medication Sig  . meloxicam (MOBIC) 7.5 MG tablet Take 7.5 mg by mouth daily.   No  current facility-administered medications on file prior to visit.     Allergies:  No Known Allergies  Social History:  Social History   Social History  . Marital status: Married    Spouse name: N/A  . Number of children: N/A  . Years of education: N/A   Occupational History  . Not on file.   Social History Main Topics  . Smoking status: Current Every Day Smoker    Packs/day: 1.00    Types: Cigarettes  . Smokeless tobacco: Never Used  . Alcohol use Yes     Comment: socially  . Drug use: No  . Sexual activity: Yes   Other Topics Concern  . Not on file   Social History Narrative  . No narrative on file   History  Smoking Status  . Current Every Day Smoker  . Packs/day: 1.00  . Types: Cigarettes  Smokeless Tobacco  . Never Used   History  Alcohol Use  . Yes    Comment: socially    Family History:  Family History  Problem Relation Age of Onset  . Alcohol abuse Father   . Arthritis Father   . Diabetes Father   . Drug abuse Father   . Cancer Father        pancreatic  . Diabetes Brother   . Cancer Mother        cervical    Past medical history,  surgical history, medications, allergies, family history and social history reviewed with patient today and changes made to appropriate areas of the chart.   Review of Systems  Constitutional: Negative.   HENT: Positive for congestion and sore throat. Negative for ear discharge, ear pain, hearing loss, nosebleeds, sinus pain and tinnitus.   Eyes: Negative.   Respiratory: Positive for cough. Negative for hemoptysis, sputum production, shortness of breath, wheezing and stridor.   Cardiovascular: Negative.   Gastrointestinal: Negative.   Genitourinary: Negative.        Itching with peeing  Musculoskeletal: Negative.   Skin: Negative.   Neurological: Negative.   Endo/Heme/Allergies: Positive for polydipsia. Negative for environmental allergies. Does not bruise/bleed easily.  Psychiatric/Behavioral: Negative.      All other ROS negative except what is listed above and in the HPI.      Objective:    BP 139/86 (BP Location: Left Arm, Patient Position: Sitting, Cuff Size: Large)   Pulse 98   Temp 98.7 F (37.1 C)   Ht 5\' 4"  (1.626 m)   Wt 257 lb 1 oz (116.6 kg)   LMP  (LMP Unknown)   SpO2 99%   BMI 44.12 kg/m   Wt Readings from Last 3 Encounters:  08/29/17 257 lb 1 oz (116.6 kg)  05/02/17 253 lb 14.4 oz (115.2 kg)  02/14/17 245 lb (111.1 kg)    Physical Exam  Constitutional: She is oriented to person, place, and time. She appears well-developed and well-nourished. No distress.  HENT:  Head: Normocephalic and atraumatic.  Right Ear: Hearing, tympanic membrane, external ear and ear canal normal.  Left Ear: Hearing, tympanic membrane, external ear and ear canal normal.  Nose: Nose normal.  Mouth/Throat: Uvula is midline, oropharynx is clear and moist and mucous membranes are normal. No oropharyngeal exudate.  Eyes: Pupils are equal, round, and reactive to light. Conjunctivae, EOM and lids are normal. Right eye exhibits no discharge. Left eye exhibits no discharge. No scleral icterus.  Neck: Normal range of motion. Neck supple. No JVD present. No tracheal deviation present. No thyromegaly present.  Cardiovascular: Normal rate, regular rhythm, normal heart sounds and intact distal pulses.  Exam reveals no gallop and no friction rub.   No murmur heard. Pulmonary/Chest: Effort normal. No stridor. No respiratory distress. She has no wheezes. She has no rales. She exhibits no tenderness. Right breast exhibits no inverted nipple, no mass, no nipple discharge, no skin change and no tenderness. Left breast exhibits no inverted nipple, no mass, no nipple discharge, no skin change and no tenderness. Breasts are symmetrical.  Abdominal: Soft. Bowel sounds are normal. She exhibits no distension and no mass. There is no tenderness. There is no rebound and no guarding. Hernia confirmed negative in the right  inguinal area and confirmed negative in the left inguinal area.  Genitourinary: No labial fusion. There is no rash, tenderness, lesion or injury on the right labia. There is no rash, tenderness, lesion or injury on the left labia. No erythema, tenderness or bleeding in the vagina. No foreign body in the vagina. No signs of injury around the vagina. Vaginal discharge found.  Musculoskeletal: Normal range of motion. She exhibits no edema, tenderness or deformity.  Lymphadenopathy:    She has no cervical adenopathy.  Neurological: She is alert and oriented to person, place, and time. She has normal reflexes. She displays normal reflexes. No cranial nerve deficit. She exhibits normal muscle tone. Coordination normal.  Skin: Skin is warm, dry and intact. No rash noted.  She is not diaphoretic. No erythema. No pallor.  Psychiatric: She has a normal mood and affect. Her speech is normal and behavior is normal. Judgment and thought content normal. Cognition and memory are normal.  Nursing note and vitals reviewed.   Results for orders placed or performed during the hospital encounter of 02/14/17  Glucose, capillary  Result Value Ref Range   Glucose-Capillary 108 (H) 65 - 99 mg/dL      Assessment & Plan:   Problem List Items Addressed This Visit      Endocrine   IFG (impaired fasting glucose)    Checking A1c today, has been doing well. Await results.       Relevant Orders   CBC with Differential/Platelet   Comprehensive metabolic panel   Hgb D3U w/o eAG   TSH   UA/M w/rflx Culture, Routine   Microalbumin, Urine Waived     Other   Iron deficiency anemia due to chronic blood loss    Rechecking iron today. Await results. Call with any concerns.       Relevant Orders   CBC with Differential/Platelet   Comprehensive metabolic panel   UA/M w/rflx Culture, Routine    Other Visit Diagnoses    Routine general medical examination at a health care facility    -  Primary   Vaccines up to  date. Screening labs checked today. Pap N/A. Mammogram ordered. Continue diet and exercise.    Relevant Orders   CBC with Differential/Platelet   Comprehensive metabolic panel   TSH   UA/M w/rflx Culture, Routine   Screening for cholesterol level       Labs drawn today. Await results.    Relevant Orders   Lipid Panel w/o Chol/HDL Ratio   Screening for breast cancer       Orders for mammogram in. Encouraged patient to get it done.    Immunization due       Flu and pneumovax given today.   Relevant Orders   Flu Vaccine QUAD 6+ mos PF IM (Fluarix Quad PF) (Completed)   Pneumococcal polysaccharide vaccine 23-valent greater than or equal to 2yo subcutaneous/IM (Completed)   Vaginal itching       Wet prep done today. + clue cells, + trich, + yeast   Relevant Orders   WET PREP FOR TRICH, YEAST, CLUE   Vaginal yeast infection       Will treat with diflucan return 2-3 weeks for recheck.    Relevant Medications   metroNIDAZOLE (FLAGYL) 500 MG tablet   fluconazole (DIFLUCAN) 150 MG tablet   Trichimoniasis       Will treat with metronidazole return 2-3 weeks for recheck.    Relevant Medications   metroNIDAZOLE (FLAGYL) 500 MG tablet   fluconazole (DIFLUCAN) 150 MG tablet   Bacterial vaginosis       Will treat with metronidazole return 2-3 weeks for recheck.    Relevant Medications   metroNIDAZOLE (FLAGYL) 500 MG tablet   fluconazole (DIFLUCAN) 150 MG tablet       Follow up plan: Return 2-3 weeks, for Repear wet prep.   LABORATORY TESTING:  - Pap smear: not applicable  IMMUNIZATIONS:   - Tdap: Tetanus vaccination status reviewed: last tetanus booster within 10 years. - Influenza: Given today - Pneumovax: Administered today  PATIENT COUNSELING:   Advised to take 1 mg of folate supplement per day if capable of pregnancy.   Sexuality: Discussed sexually transmitted diseases, partner selection, use of condoms, avoidance of unintended pregnancy  and  contraceptive alternatives.    Advised to avoid cigarette smoking.  I discussed with the patient that most people either abstain from alcohol or drink within safe limits (<=14/week and <=4 drinks/occasion for males, <=7/weeks and <= 3 drinks/occasion for females) and that the risk for alcohol disorders and other health effects rises proportionally with the number of drinks per week and how often a drinker exceeds daily limits.  Discussed cessation/primary prevention of drug use and availability of treatment for abuse.   Diet: Encouraged to adjust caloric intake to maintain  or achieve ideal body weight, to reduce intake of dietary saturated fat and total fat, to limit sodium intake by avoiding high sodium foods and not adding table salt, and to maintain adequate dietary potassium and calcium preferably from fresh fruits, vegetables, and low-fat dairy products.    stressed the importance of regular exercise  Injury prevention: Discussed safety belts, safety helmets, smoke detector, smoking near bedding or upholstery.   Dental health: Discussed importance of regular tooth brushing, flossing, and dental visits.    NEXT PREVENTATIVE PHYSICAL DUE IN 1 YEAR. Return 2-3 weeks, for Repear wet prep.

## 2017-08-29 NOTE — Assessment & Plan Note (Signed)
Rechecking iron today. Await results. Call with any concerns.

## 2017-08-30 LAB — LIPID PANEL W/O CHOL/HDL RATIO
Cholesterol, Total: 199 mg/dL (ref 100–199)
HDL: 49 mg/dL (ref >39)
LDL Calculated: 125 mg/dL — ABNORMAL HIGH (ref 0–99)
Triglycerides: 126 mg/dL (ref 0–149)
VLDL CHOLESTEROL CAL: 25 mg/dL (ref 5–40)

## 2017-08-30 LAB — COMPREHENSIVE METABOLIC PANEL
A/G RATIO: 1.6 (ref 1.2–2.2)
ALT: 18 IU/L (ref 0–32)
AST: 23 IU/L (ref 0–40)
Albumin: 4.3 g/dL (ref 3.5–5.5)
Alkaline Phosphatase: 58 IU/L (ref 39–117)
BUN/Creatinine Ratio: 10 (ref 9–23)
BUN: 7 mg/dL (ref 6–24)
Bilirubin Total: 0.4 mg/dL (ref 0.0–1.2)
CALCIUM: 9 mg/dL (ref 8.7–10.2)
CO2: 22 mmol/L (ref 20–29)
CREATININE: 0.68 mg/dL (ref 0.57–1.00)
Chloride: 103 mmol/L (ref 96–106)
GFR calc Af Amer: 123 mL/min/{1.73_m2} (ref >59)
GFR, EST NON AFRICAN AMERICAN: 107 mL/min/{1.73_m2} (ref >59)
GLOBULIN, TOTAL: 2.7 g/dL (ref 1.5–4.5)
Glucose: 156 mg/dL — ABNORMAL HIGH (ref 65–99)
Potassium: 4.1 mmol/L (ref 3.5–5.2)
Sodium: 138 mmol/L (ref 134–144)
TOTAL PROTEIN: 7 g/dL (ref 6.0–8.5)

## 2017-08-30 LAB — CBC WITH DIFFERENTIAL/PLATELET
BASOS: 0 %
Basophils Absolute: 0 10*3/uL (ref 0.0–0.2)
EOS (ABSOLUTE): 0.1 10*3/uL (ref 0.0–0.4)
EOS: 2 %
HEMATOCRIT: 40.8 % (ref 34.0–46.6)
HEMOGLOBIN: 13.3 g/dL (ref 11.1–15.9)
IMMATURE GRANS (ABS): 0 10*3/uL (ref 0.0–0.1)
IMMATURE GRANULOCYTES: 0 %
LYMPHS: 27 %
Lymphocytes Absolute: 1.7 10*3/uL (ref 0.7–3.1)
MCH: 29.6 pg (ref 26.6–33.0)
MCHC: 32.6 g/dL (ref 31.5–35.7)
MCV: 91 fL (ref 79–97)
MONOCYTES: 9 %
MONOS ABS: 0.6 10*3/uL (ref 0.1–0.9)
Neutrophils Absolute: 3.7 10*3/uL (ref 1.4–7.0)
Neutrophils: 62 %
Platelets: 223 10*3/uL (ref 150–379)
RBC: 4.5 x10E6/uL (ref 3.77–5.28)
RDW: 14.7 % (ref 12.3–15.4)
WBC: 6.1 10*3/uL (ref 3.4–10.8)

## 2017-08-30 LAB — TSH: TSH: 0.697 u[IU]/mL (ref 0.450–4.500)

## 2017-08-30 LAB — HGB A1C W/O EAG: Hgb A1c MFr Bld: 5.6 % (ref 4.8–5.6)

## 2017-09-14 ENCOUNTER — Encounter: Payer: Self-pay | Admitting: Family Medicine

## 2017-09-14 ENCOUNTER — Ambulatory Visit (INDEPENDENT_AMBULATORY_CARE_PROVIDER_SITE_OTHER): Payer: Commercial Managed Care - PPO | Admitting: Family Medicine

## 2017-09-14 VITALS — BP 109/76 | HR 101 | Resp 14 | Ht 64.0 in | Wt 254.0 lb

## 2017-09-14 DIAGNOSIS — R300 Dysuria: Secondary | ICD-10-CM | POA: Diagnosis not present

## 2017-09-14 DIAGNOSIS — M545 Low back pain, unspecified: Secondary | ICD-10-CM

## 2017-09-14 DIAGNOSIS — A599 Trichomoniasis, unspecified: Secondary | ICD-10-CM

## 2017-09-14 MED ORDER — CYCLOBENZAPRINE HCL 10 MG PO TABS: 10.0000 mg | ORAL_TABLET | Freq: Every day | ORAL | 0 refills | Status: DC

## 2017-09-14 NOTE — Progress Notes (Signed)
BP 109/76   Pulse (!) 101   Resp 14   Ht 5\' 4"  (1.626 m)   Wt 254 lb (115.2 kg)   LMP  (LMP Unknown)   BMI 43.60 kg/m    Subjective:    Patient ID: Amanda Moran, female    DOB: [birth year], 46 y.o.   MRN: [id number]  HPI: Amanda Moran is a 46 y.o. female  Chief Complaint  Patient presents with  . Follow-up   VAGINAL DISCHARGE- diagnosed with Trich, BV and yeast 2 weeks ago. Here today to get rechecked and make sure it's gone. Husband was also treated. Denies any other partners. Duration: 2 weeks Discharge description: thick and white- now resolved  Pruritus: no Dysuria: no Malodorous: no Urinary frequency: yes Urgency: yes Fevers: no Abdominal pain: no  Sexual activity: monogamous History of sexually transmitted diseases: yes Recent antibiotic use: yes Context: better  Treatments attempted: metronidazole  Had a patient almost fall off a hoyer lift on Friday- has been having low back pain since then. Very tight. Irritated. Pain with movement. Better with tiger balm and meloxicam. Worse with movement. No numbness or tingling. She is otherwise doing well with no other concerns or complaints at this time.   Relevant past medical, surgical, family and social history reviewed and updated as indicated. Interim medical history since our last visit reviewed. Allergies and medications reviewed and updated.  Review of Systems  Constitutional: Negative.   Respiratory: Negative.   Cardiovascular: Negative.   Genitourinary: Negative.   Psychiatric/Behavioral: Negative.     Per HPI unless specifically indicated above     Objective:    BP 109/76   Pulse (!) 101   Resp 14   Ht 5\' 4"  (1.626 m)   Wt 254 lb (115.2 kg)   LMP  (LMP Unknown)   BMI 43.60 kg/m   Wt Readings from Last 3 Encounters:  09/14/17 254 lb (115.2 kg)  08/29/17 257 lb 1 oz (116.6 kg)  05/02/17 253 lb 14.4 oz (115.2 kg)    Physical Exam  Constitutional: She is oriented to person, place, and  time. She appears well-developed and well-nourished. No distress.  HENT:  Head: Normocephalic and atraumatic.  Right Ear: Hearing normal.  Left Ear: Hearing normal.  Nose: Nose normal.  Eyes: Conjunctivae and lids are normal. Right eye exhibits no discharge. Left eye exhibits no discharge. No scleral icterus.  Pulmonary/Chest: Effort normal. No respiratory distress.  Abdominal: Hernia confirmed negative in the right inguinal area and confirmed negative in the left inguinal area.  Genitourinary: No labial fusion. There is no rash, tenderness, lesion or injury on the right labia. There is no rash, tenderness, lesion or injury on the left labia. No erythema, tenderness or bleeding in the vagina. No foreign body in the vagina. No signs of injury around the vagina. Vaginal discharge found.  Musculoskeletal: Normal range of motion.  Neurological: She is alert and oriented to person, place, and time.  Skin: Skin is warm, dry and intact. No rash noted. No erythema. No pallor.  Psychiatric: She has a normal mood and affect. Her speech is normal and behavior is normal. Judgment and thought content normal. Cognition and memory are normal.  Nursing note and vitals reviewed. Back Exam:    Inspection:  Normal spinal curvature.  No deformity, ecchymosis, erythema, or lesions     Palpation:     Midline spinal tenderness: no      Paralumbar tenderness: yes Right     Parathoracic  tenderness: no      Buttocks tenderness: yesRight     Range of Motion:      Flexion: Fingers to Knees     Extension:Decreased     Lateral bending:Decreased    Rotation:Decreased    Neuro Exam:Lower extremity DTRs normal & symmetric.  Strength and sensation intact.    Special Tests:      Straight leg raise:negative  Results for orders placed or performed in visit on 08/29/17  Microscopic Examination  Result Value Ref Range   WBC, UA 0-5 0 - 5 /hpf   RBC, UA None seen 0 - 2 /hpf   Epithelial Cells (non renal) 0-10 0 - 10  /hpf   Mucus, UA Present (A) Not Estab.   Bacteria, UA Few None seen/Few   Yeast, UA Present (A) None seen  CBC with Differential/Platelet  Result Value Ref Range   WBC 6.1 3.4 - 10.8 x10E3/uL   RBC 4.50 3.77 - 5.28 x10E6/uL   Hemoglobin 13.3 11.1 - 15.9 g/dL   Hematocrit 40.8 34.0 - 46.6 %   MCV 91 79 - 97 fL   MCH 29.6 26.6 - 33.0 pg   MCHC 32.6 31.5 - 35.7 g/dL   RDW 14.7 12.3 - 15.4 %   Platelets 223 150 - 379 x10E3/uL   Neutrophils 62 Not Estab. %   Lymphs 27 Not Estab. %   Monocytes 9 Not Estab. %   Eos 2 Not Estab. %   Basos 0 Not Estab. %   Neutrophils Absolute 3.7 1.4 - 7.0 x10E3/uL   Lymphocytes Absolute 1.7 0.7 - 3.1 x10E3/uL   Monocytes Absolute 0.6 0.1 - 0.9 x10E3/uL   EOS (ABSOLUTE) 0.1 0.0 - 0.4 x10E3/uL   Basophils Absolute 0.0 0.0 - 0.2 x10E3/uL   Immature Granulocytes 0 Not Estab. %   Immature Grans (Abs) 0.0 0.0 - 0.1 x10E3/uL  Comprehensive metabolic panel  Result Value Ref Range   Glucose 156 (H) 65 - 99 mg/dL   BUN 7 6 - 24 mg/dL   Creatinine, Ser 0.68 0.57 - 1.00 mg/dL   GFR calc non Af Amer 107 >59 mL/min/1.73   GFR calc Af Amer 123 >59 mL/min/1.73   BUN/Creatinine Ratio 10 9 - 23   Sodium 138 134 - 144 mmol/L   Potassium 4.1 3.5 - 5.2 mmol/L   Chloride 103 96 - 106 mmol/L   CO2 22 20 - 29 mmol/L   Calcium 9.0 8.7 - 10.2 mg/dL   Total Protein 7.0 6.0 - 8.5 g/dL   Albumin 4.3 3.5 - 5.5 g/dL   Globulin, Total 2.7 1.5 - 4.5 g/dL   Albumin/Globulin Ratio 1.6 1.2 - 2.2   Bilirubin Total 0.4 0.0 - 1.2 mg/dL   Alkaline Phosphatase 58 39 - 117 IU/L   AST 23 0 - 40 IU/L   ALT 18 0 - 32 IU/L  Hgb A1c w/o eAG  Result Value Ref Range   Hgb A1c MFr Bld 5.6 4.8 - 5.6 %  TSH  Result Value Ref Range   TSH 0.697 0.450 - 4.500 uIU/mL  UA/M w/rflx Culture, Routine  Result Value Ref Range   Specific Gravity, UA 1.025 1.005 - 1.030   pH, UA 5.5 5.0 - 7.5   Color, UA Yellow Yellow   Appearance Ur Cloudy (A) Clear   Leukocytes, UA Trace (A) Negative    Protein, UA Negative Negative/Trace   Glucose, UA Negative Negative   Ketones, UA Negative Negative   RBC, UA Trace (A) Negative  Bilirubin, UA Negative Negative   Urobilinogen, Ur 0.2 0.2 - 1.0 mg/dL   Nitrite, UA Negative Negative   Microscopic Examination See below:   Lipid Panel w/o Chol/HDL Ratio  Result Value Ref Range   Cholesterol, Total 199 100 - 199 mg/dL   Triglycerides 126 0 - 149 mg/dL   HDL 49 >39 mg/dL   VLDL Cholesterol Cal 25 5 - 40 mg/dL   LDL Calculated 125 (H) 0 - 99 mg/dL  Microalbumin, Urine Waived  Result Value Ref Range   Microalb, Ur Waived 30 (H) 0 - 19 mg/L   Creatinine, Urine Waived 300 10 - 300 mg/dL   Microalb/Creat Ratio <30 <30 mg/g  WET PREP FOR TRICH, YEAST, CLUE  Result Value Ref Range   Trichomonas Exam Positive (A) Negative   Yeast Exam Positive (A) Negative   Clue Cell Exam Positive (A) Negative      Assessment & Plan:   Problem List Items Addressed This Visit    None    Visit Diagnoses    Trichimoniasis    -  Primary   Resolved. Wet prep negative. Call with any concerns.    Relevant Orders   WET PREP FOR TRICH, YEAST, CLUE   Dysuria       Will check UA. Negative. Call with any concerns.    Relevant Orders   UA/M w/rflx Culture, Routine   Acute bilateral low back pain without sciatica       Will treat with flexeril and continue meloxicam. Exercises given. Call if not getting better or getting worse.    Relevant Medications   cyclobenzaprine (FLEXERIL) 10 MG tablet       Follow up plan: Return in about 6 months (around 03/14/2018) for 6 month follow up.

## 2017-09-14 NOTE — Patient Instructions (Addendum)

## 2017-09-15 ENCOUNTER — Ambulatory Visit
Admission: RE | Admit: 2017-09-15 | Discharge: 2017-09-15 | Disposition: A | Discharge status: Home / Self Care | Payer: Commercial Managed Care - PPO | Source: Ambulatory Visit | Attending: Family Medicine | Admitting: Family Medicine

## 2017-09-15 DIAGNOSIS — Z1231 Encounter for screening mammogram for malignant neoplasm of breast: Secondary | ICD-10-CM | POA: Diagnosis present

## 2017-09-15 DIAGNOSIS — Z1239 Encounter for other screening for malignant neoplasm of breast: Secondary | ICD-10-CM

## 2017-09-15 LAB — UA/M W/RFLX CULTURE, ROUTINE
Bilirubin, UA: NEGATIVE
Glucose, UA: NEGATIVE
Ketones, UA: NEGATIVE
Leukocytes, UA: NEGATIVE
Nitrite, UA: NEGATIVE
PH UA: 5 (ref 5.0–7.5)
Protein, UA: NEGATIVE
Specific Gravity, UA: >1.03 — ABNORMAL HIGH (ref 1.005–1.030)
Urobilinogen, Ur: 0.2 mg/dL (ref 0.2–1.0)

## 2017-09-15 LAB — WET PREP FOR TRICH, YEAST, CLUE
Clue Cell Exam: NEGATIVE
TRICHOMONAS EXAM: NEGATIVE
YEAST EXAM: NEGATIVE

## 2017-09-15 LAB — MICROSCOPIC EXAMINATION
BACTERIA UA: NONE SEEN
RBC, UA: NONE SEEN /hpf (ref 0–?)

## 2017-09-19 ENCOUNTER — Other Ambulatory Visit: Payer: Self-pay | Admitting: Family Medicine

## 2017-09-19 DIAGNOSIS — R928 Other abnormal and inconclusive findings on diagnostic imaging of breast: Secondary | ICD-10-CM

## 2017-09-19 DIAGNOSIS — N6489 Other specified disorders of breast: Secondary | ICD-10-CM

## 2017-09-25 ENCOUNTER — Ambulatory Visit
Admission: RE | Admit: 2017-09-25 | Discharge: 2017-09-25 | Disposition: A | Discharge status: Home / Self Care | Payer: Commercial Managed Care - PPO | Source: Ambulatory Visit | Attending: Family Medicine | Admitting: Family Medicine

## 2017-09-25 DIAGNOSIS — N6322 Unspecified lump in the left breast, upper inner quadrant: Secondary | ICD-10-CM | POA: Insufficient documentation

## 2017-09-25 DIAGNOSIS — N6489 Other specified disorders of breast: Secondary | ICD-10-CM | POA: Diagnosis not present

## 2017-09-25 DIAGNOSIS — R928 Other abnormal and inconclusive findings on diagnostic imaging of breast: Secondary | ICD-10-CM

## 2017-09-26 ENCOUNTER — Other Ambulatory Visit: Payer: Self-pay | Admitting: Family Medicine

## 2017-09-26 DIAGNOSIS — N6320 Unspecified lump in the left breast, unspecified quadrant: Secondary | ICD-10-CM

## 2017-09-26 DIAGNOSIS — R928 Other abnormal and inconclusive findings on diagnostic imaging of breast: Secondary | ICD-10-CM

## 2017-09-28 ENCOUNTER — Ambulatory Visit
Admission: RE | Admit: 2017-09-28 | Discharge: 2017-09-28 | Disposition: A | Discharge status: Home / Self Care | Payer: Commercial Managed Care - PPO | Source: Ambulatory Visit | Attending: Family Medicine | Admitting: Family Medicine

## 2017-09-28 DIAGNOSIS — R928 Other abnormal and inconclusive findings on diagnostic imaging of breast: Secondary | ICD-10-CM | POA: Insufficient documentation

## 2017-09-28 DIAGNOSIS — N6320 Unspecified lump in the left breast, unspecified quadrant: Secondary | ICD-10-CM | POA: Insufficient documentation

## 2017-09-29 LAB — SURGICAL PATHOLOGY

## 2017-10-04 HISTORY — PX: BREAST BIOPSY: SHX20

## 2017-11-10 ENCOUNTER — Encounter: Payer: Self-pay | Admitting: Family Medicine

## 2017-11-10 ENCOUNTER — Ambulatory Visit: Payer: Self-pay | Admitting: *Deleted

## 2017-11-10 ENCOUNTER — Ambulatory Visit (INDEPENDENT_AMBULATORY_CARE_PROVIDER_SITE_OTHER): Payer: Commercial Managed Care - PPO | Admitting: Family Medicine

## 2017-11-10 VITALS — BP 123/78 | HR 87 | Temp 98.7°F | Wt 259.2 lb

## 2017-11-10 DIAGNOSIS — M25561 Pain in right knee: Secondary | ICD-10-CM

## 2017-11-10 MED ORDER — HYDROCODONE-ACETAMINOPHEN 5-325 MG PO TABS: 1.0000 | ORAL_TABLET | Freq: Three times a day (TID) | ORAL | 0 refills | Status: DC | PRN

## 2017-11-10 MED ORDER — CYCLOBENZAPRINE HCL 10 MG PO TABS: 10.0000 mg | ORAL_TABLET | Freq: Every day | ORAL | 0 refills | Status: DC

## 2017-11-10 NOTE — Patient Instructions (Signed)
[facility] [place] [facility] [address], [place], [place] [address]   

## 2017-11-10 NOTE — Telephone Encounter (Signed)
She had ACL replacement surgery back in Feb of this year.   She fell an hour ago taking the trash out.  She had on baggy pants and flip flops when she fell on concrete.   She was able to get up by rolling over onto her side and getting up using her good leg.  She is now experiencing a throbbing pain on the inside of her knee that is pretty intense.   She did not feel her knee pop or give way.  "I think I may have tripped"   She is using pain medication and has ice on it now.   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   I got an appt with Merrie Roof, PA-C today at 1:30.  Reason for Disposition . [1] Limp when walking AND [2] due to a direct blow  Answer Assessment - Initial Assessment Questions 1. MECHANISM: "How did the injury happen?" (e.g., twisting injury, direct blow)      I had my ACL replaced in Feb.    2. ONSET: "When did the injury happen?" (Minutes or hours ago)      I fell today taking the trash out onto concrete. Any hour ago 3. LOCATION: "Where is the injury located?"      Right knee.   It throbs on the inside of the knee 4. APPEARANCE of INJURY: "What does the injury look like?"      No skin injury 5. SEVERITY: "Can you put weight on that leg?" "Can you walk?"      Can walk but having to use a cane to get around. 6. SIZE: For cuts, bruises, or swelling, ask: "How large is it?" (e.g., inches or centimeters;  entire joint)      None 7. PAIN: "Is there pain?" If so, ask: "How bad is the pain?"    (e.g., Scale 1-10; or mild, moderate, severe)     8 Severe   Pulsing throbbing pain.   It's not the same pain as when my knee gave way before my surgery. 8. TETANUS: For any breaks in the skin, ask: "When was the last tetanus booster?"     N/A 9. OTHER SYMPTOMS: "Do you have any other symptoms?"  (e.g., "pop" when knee injured, swelling, locking, buckling)      Didn't feel it pop or anything.  Don't know if I tripped or what.   I had on long baggy  pants with flip flops.   I think I tripped. 10. PREGNANCY: "Is there any chance you are pregnant?" "When was your last menstrual period?"       No  Protocols used: KNEE INJURY-A-AH

## 2017-11-10 NOTE — Progress Notes (Signed)
BP 123/78 (BP Location: Left Arm, Patient Position: Sitting, Cuff Size: Normal)   Pulse 87   Temp 98.7 F (37.1 C) (Oral)   Wt 259 lb 3.2 oz (117.6 kg)   LMP  (LMP Unknown)   SpO2 98%   BMI 44.49 kg/m    Subjective:    Patient ID: Amanda Moran, female    DOB: October 08, 1971, 46 y.o.   MRN: [id number]  HPI: Amanda Moran is a 46 y.o. female  Chief Complaint  Patient presents with  . Knee Injury    Patient was taking out trash and fell on both knees and hands. Her left knee is fine, but her right knee is throbbing, burning, and painful. Says it wraps around to the R side of her knee, to the back.   . Fall   Right knee pain and swelling after falling while taking the garbage out this morning. Fell onto knees, instant throbbing right knee pain that is constant. Has been icing the knee, trying to rest it. Has full ROM but weight bearing is very painful. Taking her normal meloxicam daily, took 2 today. Some relief with that. Is s/p arthroscopy with ACL repair on this side.   Relevant past medical, surgical, family and social history reviewed and updated as indicated. Interim medical history since our last visit reviewed. Allergies and medications reviewed and updated.  Review of Systems  Constitutional: Negative.   HENT: Negative.   Eyes: Negative.   Respiratory: Negative.   Cardiovascular: Negative.   Gastrointestinal: Negative.   Genitourinary: Negative.   Musculoskeletal: Positive for arthralgias, gait problem and joint swelling.  Psychiatric/Behavioral: Negative.     Per HPI unless specifically indicated above     Objective:    BP 123/78 (BP Location: Left Arm, Patient Position: Sitting, Cuff Size: Normal)   Pulse 87   Temp 98.7 F (37.1 C) (Oral)   Wt 259 lb 3.2 oz (117.6 kg)   LMP  (LMP Unknown)   SpO2 98%   BMI 44.49 kg/m   Wt Readings from Last 3 Encounters:  11/10/17 259 lb 3.2 oz (117.6 kg)  09/14/17 254 lb (115.2 kg)  08/29/17 257 lb 1 oz (116.6 kg)    Physical Exam  Constitutional: She is oriented to person, place, and time. No distress.  Obese  HENT:  Head: Atraumatic.  Eyes: Conjunctivae are normal. Pupils are equal, round, and reactive to light.  Neck: Normal range of motion. Neck supple.  Cardiovascular: Normal rate and normal heart sounds.  Pulmonary/Chest: Effort normal and breath sounds normal. No respiratory distress.  Musculoskeletal: She exhibits edema (trace edema comparatively in the right knee) and tenderness (over anterior surface of right knee and with deep palpation behind knee). She exhibits no deformity.  No joint instability Seemingly good PROM, this exam was limited by pt discomfort Negative varus and valgus testing  Neurological: She is alert and oriented to person, place, and time.  Skin: Skin is warm and dry.  Psychiatric: She has a normal mood and affect. Her behavior is normal.  Nursing note and vitals reviewed.     Assessment & Plan:   Problem List Items Addressed This Visit    None    Visit Diagnoses    Acute pain of right knee    -  Primary   Will get right knee x-ray, RICE protocol, tylenol and meloxicam prn. Take 1 week off of work to rest. Small supply of hydrocodone given for severe pain prn   Relevant Orders  DG Knee Complete 4 Views Right       Follow up plan: Return if symptoms worsen or fail to improve.

## 2018-01-11 ENCOUNTER — Telehealth: Payer: Self-pay | Admitting: Family Medicine

## 2018-01-11 MED ORDER — MELOXICAM 15 MG PO TABS: 15.0000 mg | ORAL_TABLET | Freq: Every day | ORAL | 1 refills | Status: DC

## 2018-01-11 NOTE — Telephone Encounter (Signed)
Routed back to provider 

## 2018-01-11 NOTE — Telephone Encounter (Signed)
Copied from [place] [id number]. Topic: Quick Communication - Rx Refill/Question >> Jan 11, 2018  1:56 PM Sandi Mariscal E, NT wrote: Medication: meloxicam (MOBIC) 7.5 MG tablet. Pt asks if she can get medication increased to 15 mg.   Has the patient contacted their pharmacy? Yes    (Agent: If no, request that the patient contact the pharmacy for the refill.)   Preferred Pharmacy (with phone number or street name): [id number] - [place], [phone] (Phone) [phone] (Fax)     Agent: Please be advised that RX refills may take up to 3 business days. We ask that you follow-up with your pharmacy.

## 2018-04-11 ENCOUNTER — Ambulatory Visit: Payer: Self-pay

## 2018-04-11 NOTE — Telephone Encounter (Signed)
Patient called in with c/o "feelings of hyperventilation." She says "I was out walking at the park with my grand kids and I noticed I was a little short winded, but didn't think anything of it because I was walking. I got home trying to get ready for work today, I had to be here at 3 pm, and I felt like I was hyperventilating. I breathed in a paper bag, put my head between my knees, but I still was feeling like that. I got to work a little before 3 pm and my DON asked what was wrong. I told her I was having some shortness of breath. They put the pulse ox on and my oxygen was 98 and HR was 145. I was having some tingling in my fingers and my mouth and tongue, feeling like if your arm or leg was sleep and feeling coming back. She checked my BP and said it was too high to read on the automatic cuff. She used a manual and said it was high, but I can't remember what it was. My HR came down to about 120's and I was feeling better. The tingling lasted until about 5:30 pm. I'm still at work now." I asked how is her breathing, she says "I still feel like I need to take a deeper breath, but nothing like before." I asked if she has swelling, she says "yes, to the back of my ankles for the past couple of weeks. My ankles have also been sore and hurt when I sit down for a long time. I have to walk and that eases the pain." I asked about new medications or h/o hypertension, she says "I started taking Garcina for weight loss a few weeks ago, but I took this years ago when I lost weight. I don't have hypertension." According to protocol, see PCP within 2 weeks, no availability tomorrow with PCP, appointment scheduled for tomorrow at 1530 with Merrie Roof, PA, care advice given, patient verbalized understanding.  Reason for Disposition . [9] Systolic BP  >= 741 OR Diastolic >= 90 AND [6] not taking BP medications  Answer Assessment - Initial Assessment Questions 1. BLOOD PRESSURE: "What is the blood pressure?" "Did you take at  least two measurements 5 minutes apart?"     140/94 2. ONSET: "When did you take your blood pressure?"     5 minutes ago 3. HOW: "How did you obtain the blood pressure?" (e.g., visiting nurse, automatic home BP monitor)     Manual BP cuff 4. HISTORY: "Do you have a history of high blood pressure?"     No 5. MEDICATIONS: "Are you taking any medications for blood pressure?" "Have you missed any doses recently?"     No 6. OTHER SYMPTOMS: "Do you have any symptoms?" (e.g., headache, chest pain, blurred vision, difficulty breathing, weakness)     Ankle swelling x 2 weeks, taking deeper breaths when moving around, red light flashes today 7. PREGNANCY: "Is there any chance you are pregnant?" "When was your last menstrual period?"     No  Protocols used: HIGH BLOOD PRESSURE-A-AH

## 2018-04-12 ENCOUNTER — Encounter: Payer: Self-pay | Admitting: Family Medicine

## 2018-04-12 ENCOUNTER — Ambulatory Visit: Payer: Commercial Managed Care - PPO | Admitting: Family Medicine

## 2018-04-12 VITALS — BP 118/82 | HR 90 | Wt 259.0 lb

## 2018-04-12 DIAGNOSIS — R030 Elevated blood-pressure reading, without diagnosis of hypertension: Secondary | ICD-10-CM | POA: Diagnosis not present

## 2018-04-12 DIAGNOSIS — R0602 Shortness of breath: Secondary | ICD-10-CM

## 2018-04-12 DIAGNOSIS — M79672 Pain in left foot: Secondary | ICD-10-CM

## 2018-04-12 DIAGNOSIS — M79671 Pain in right foot: Secondary | ICD-10-CM | POA: Diagnosis not present

## 2018-04-12 DIAGNOSIS — R002 Palpitations: Secondary | ICD-10-CM | POA: Diagnosis not present

## 2018-04-12 MED ORDER — CYCLOBENZAPRINE HCL 10 MG PO TABS: 10.0000 mg | ORAL_TABLET | Freq: Every evening | ORAL | 1 refills | Status: DC | PRN

## 2018-04-12 NOTE — Progress Notes (Addendum)
BP 118/82   Pulse 90   Wt 259 lb (117.5 kg)   LMP  (LMP Unknown)   SpO2 98%   BMI 44.46 kg/m    Subjective:    Patient ID: Amanda Moran, female    DOB: November 16, 1971, 47 y.o.   MRN: [id number]  HPI: Amanda Moran is a 47 y.o. female  Chief Complaint  Patient presents with  . Hypertension  . Ankle Pain   Pt here today due to recent concerns about elevated BPs. States when she's checked recently it's been 130s-140s/high 80s-90s at times. Also noting one episode the past week where her pulse got up to 120s-130s assoc with SOB. Felt like a panic attack but she denies having been in any distress. Could not fully catch her breath afterward for several hours per pt. Has not happened since. Denies CP, current SOB, HAs, dizziness, visual disturbances.   Ankle pain and swelling, worst after sitting still for long periods of time x 3-4 months. Tried changing diet, switching to only water, memory foam shoes, and NSAIDs with no relief. No known injury or hx of ankle issues. Pain was in her heels the past 6 months, now just in achilles area b/l.   Past Medical History:  Diagnosis Date  . Acanthosis nigricans   . Anemia   . Diabetes mellitus without complication [facility])    patient unaware  . Fibroids   . Fibroids   . Headache   . Iron deficiency anemia    Social History   Socioeconomic History  . Marital status: Married    Spouse name: Not on file  . Number of children: Not on file  . Years of education: Not on file  . Highest education level: Not on file  Occupational History  . Not on file  Social Needs  . Financial resource strain: Not on file  . Food insecurity:    Worry: Not on file    Inability: Not on file  . Transportation needs:    Medical: Not on file    Non-medical: Not on file  Tobacco Use  . Smoking status: Current Every Day Smoker    Packs/day: 1.00    Types: Cigarettes  . Smokeless tobacco: Never Used  Substance and Sexual Activity  . Alcohol use: Yes   Comment: socially  . Drug use: No  . Sexual activity: Yes  Lifestyle  . Physical activity:    Days per week: Not on file    Minutes per session: Not on file  . Stress: Not on file  Relationships  . Social connections:    Talks on phone: Not on file    Gets together: Not on file    Attends religious service: Not on file    Active member of club or organization: Not on file    Attends meetings of clubs or organizations: Not on file    Relationship status: Not on file  . Intimate partner violence:    Fear of current or ex partner: Not on file    Emotionally abused: Not on file    Physically abused: Not on file    Forced sexual activity: Not on file  Other Topics Concern  . Not on file  Social History Narrative  . Not on file    Relevant past medical, surgical, family and social history reviewed and updated as indicated. Interim medical history since our last visit reviewed. Allergies and medications reviewed and updated.  Review of Systems  Per HPI unless  specifically indicated above     Objective:    BP 118/82   Pulse 90   Wt 259 lb (117.5 kg)   LMP  (LMP Unknown)   SpO2 98%   BMI 44.46 kg/m   Wt Readings from Last 3 Encounters:  04/12/18 259 lb (117.5 kg)  11/10/17 259 lb 3.2 oz (117.6 kg)  09/14/17 254 lb (115.2 kg)    Physical Exam  Constitutional: She is oriented to person, place, and time. She appears well-developed and well-nourished. No distress.  HENT:  Head: Atraumatic.  Eyes: Pupils are equal, round, and reactive to light. Conjunctivae are normal.  Neck: Normal range of motion. Neck supple.  Cardiovascular: Normal rate, regular rhythm and normal heart sounds.  Pulmonary/Chest: Effort normal and breath sounds normal.  Musculoskeletal: Normal range of motion. She exhibits edema (trace edema b/l achilles) and tenderness (ttp b/l heels into achilles, worse with dorsiflexion of feet).  Neurological: She is alert and oriented to person, place, and time.  Coordination normal.  Skin: Skin is warm and dry.  Psychiatric: She has a normal mood and affect. Her behavior is normal.  Nursing note and vitals reviewed.   Results for orders placed or performed during the hospital encounter of 09/28/17  Surgical pathology  Result Value Ref Range   SURGICAL PATHOLOGY      Surgical Pathology CASE: [id number] PATIENT: Irem Mole Surgical Pathology Report     SPECIMEN SUBMITTED: A. Breast, left B. Axilla, left  CLINICAL HISTORY: Left mass and prominent LN R/O [facility]  PRE-OPERATIVE DIAGNOSIS: None provided  POST-OPERATIVE DIAGNOSIS: None provided.     DIAGNOSIS: A. BREAST, LEFT; BIOPSY: - FIBROADENOMA. - NEGATIVE FOR ATYPIA AND MALIGNANCY.  B. AXILLARY LYMPH NODE, LEFT; BIOPSY: - BENIGN REACTIVE LYMPH NODE WITH DERMATOPATHIC CHANGES. - NEGATIVE FOR MALIGNANCY.   GROSS DESCRIPTION:  A. The specimen is received in a formalin-filled container labeled with the patient's name and left breast 9:30, 3 cmFN.  Core pieces: multiple, aggregate 1.6 x 0.5 x 0.1 cm Comments: yellow to red lobulated fibrofatty fragment, marked green  Entirely submitted in cassette(s): 1  Time/Date in fixative: collected and placed in formalin at 10:08 AM on 09/28/2017 Total fixation time: 6.75 hours B. The specimen is received in a formali n-filled container labeled with the patient's name and left axilla.  Core pieces: multiple Measurement: 1.8 x 0.2 x 0.1  cm aggregate Comments: yellow to tan fragments, marked blue  Entirely submitted in cassette(s): 1  Time/Date in fixative: collected and placed in formalin at 10:19 AM on 09/28/2017 Total fixation time: 6.5 hours    Final Diagnosis performed by Quay Burow, MD.  Electronically signed 09/29/2017 9:58:42AM    The electronic signature indicates that the named Attending Pathologist has evaluated the specimen  Technical component performed at [facility], [address] [place], [place]  [address] Lab: [phone] Dir: Darrick Penna. Evette Doffing, MD  Professional component performed at [facility], [facility], [place], [place], [place] [address] Lab: [phone] Dir: Dellia Nims. Rubinas, MD        Assessment & Plan:   Problem List Items Addressed This Visit    None    Visit Diagnoses    Heel pain, bilateral    -  Primary   Suspect persistent plantar fasciitis, recommended braces, flexeril, meloxicam, soaks, stretches. Podiatry referral placed given chronicity   Relevant Orders   Ambulatory referral to Podiatry   SOB (shortness of breath)       Assoc with palpitations on one occasion.  Will continue to monitor for recurrences, work on increasing physical activity and weight loss in meantime   Palpitations       EKG today without acute abnormalities, NSR at 86 bpm. If another episode occurs, will refer for event monitor placement. Conditioning recommended   Relevant Orders   EKG 12-Lead (Completed)   Elevated blood pressure reading       Pt agreeable to working on lifestyle modifications and continuing to monitor rather than starting low dose medication today. F/u with persistent abnormals       Follow up plan: Return in about 3 months (around 07/12/2018) for BP recheck.

## 2018-04-12 NOTE — Patient Instructions (Addendum)
Epsom salt soaks, flexeril, plantar fasciitis bracing, ankle stretches, massage with muscle rubs, tylenol in addition to the meloxicam - await Podiatry referral  Try getting at least 30 min of aerobic exercise 5 times weekly   DASH Eating Plan DASH stands for "Dietary Approaches to Stop Hypertension." The DASH eating plan is a healthy eating plan that has been shown to reduce high blood pressure (hypertension). It may also reduce your risk for type 2 diabetes, heart disease, and stroke. The DASH eating plan may also help with weight loss. What are tips for following this plan? General guidelines  Avoid eating more than 2,300 mg (milligrams) of salt (sodium) a day. If you have hypertension, you may need to reduce your sodium intake to 1,500 mg a day.  Limit alcohol intake to no more than 1 drink a day for nonpregnant women and 2 drinks a day for men. One drink equals 12 oz of beer, 5 oz of wine, or 1 oz of hard liquor.  Work with your health care provider to maintain a healthy body weight or to lose weight. Ask what an ideal weight is for you.  Get at least 30 minutes of exercise that causes your heart to beat faster (aerobic exercise) most days of the week. Activities may include walking, swimming, or biking.  Work with your health care provider or diet and nutrition specialist (dietitian) to adjust your eating plan to your individual calorie needs. Reading food labels  Check food labels for the amount of sodium per serving. Choose foods with less than 5 percent of the Daily Value of sodium. Generally, foods with less than 300 mg of sodium per serving fit into this eating plan.  To find whole grains, look for the word "whole" as the first word in the ingredient list. Shopping  Buy products labeled as "low-sodium" or "no salt added."  Buy fresh foods. Avoid canned foods and premade or frozen meals. Cooking  Avoid adding salt when cooking. Use salt-free seasonings or herbs instead of  table salt or sea salt. Check with your health care provider or pharmacist before using salt substitutes.  Do not fry foods. Cook foods using healthy methods such as baking, boiling, grilling, and broiling instead.  Cook with heart-healthy oils, such as olive, canola, soybean, or sunflower oil. Meal planning   Eat a balanced diet that includes: ? 5 or more servings of fruits and vegetables each day. At each meal, try to fill half of your plate with fruits and vegetables. ? Up to 6-8 servings of whole grains each day. ? Less than 6 oz of lean meat, poultry, or fish each day. A 3-oz serving of meat is about the same size as a deck of cards. One egg equals 1 oz. ? 2 servings of low-fat dairy each day. ? A serving of nuts, seeds, or beans 5 times each week. ? Heart-healthy fats. Healthy fats called Omega-3 fatty acids are found in foods such as flaxseeds and coldwater fish, like sardines, salmon, and mackerel.  Limit how much you eat of the following: ? Canned or prepackaged foods. ? Food that is high in trans fat, such as fried foods. ? Food that is high in saturated fat, such as fatty meat. ? Sweets, desserts, sugary drinks, and other foods with added sugar. ? Full-fat dairy products.  Do not salt foods before eating.  Try to eat at least 2 vegetarian meals each week.  Eat more home-cooked food and less restaurant, buffet, and fast food.  When eating at a restaurant, ask that your food be prepared with less salt or no salt, if possible. What foods are recommended? The items listed may not be a complete list. Talk with your dietitian about what dietary choices are best for you. Grains Whole-grain or whole-wheat bread. Whole-grain or whole-wheat pasta. Brown rice. Modena Morrow. Bulgur. Whole-grain and low-sodium cereals. Pita bread. Low-fat, low-sodium crackers. Whole-wheat flour tortillas. Vegetables Fresh or frozen vegetables (raw, steamed, roasted, or grilled). Low-sodium or  reduced-sodium tomato and vegetable juice. Low-sodium or reduced-sodium tomato sauce and tomato paste. Low-sodium or reduced-sodium canned vegetables. Fruits All fresh, dried, or frozen fruit. Canned fruit in natural juice (without added sugar). Meat and other protein foods Skinless chicken or [place]. Ground chicken or [place]. Pork with fat trimmed off. Fish and seafood. Egg whites. Dried beans, peas, or lentils. Unsalted nuts, nut butters, and seeds. Unsalted canned beans. Lean cuts of beef with fat trimmed off. Low-sodium, lean deli meat. Dairy Low-fat (1%) or fat-free (skim) milk. Fat-free, low-fat, or reduced-fat cheeses. Nonfat, low-sodium ricotta or cottage cheese. Low-fat or nonfat yogurt. Low-fat, low-sodium cheese. Fats and oils Soft margarine without trans fats. Vegetable oil. Low-fat, reduced-fat, or light mayonnaise and salad dressings (reduced-sodium). Canola, safflower, olive, soybean, and sunflower oils. Avocado. Seasoning and other foods Herbs. Spices. Seasoning mixes without salt. Unsalted popcorn and pretzels. Fat-free sweets. What foods are not recommended? The items listed may not be a complete list. Talk with your dietitian about what dietary choices are best for you. Grains Baked goods made with fat, such as croissants, muffins, or some breads. Dry pasta or rice meal packs. Vegetables Creamed or fried vegetables. Vegetables in a cheese sauce. Regular canned vegetables (not low-sodium or reduced-sodium). Regular canned tomato sauce and paste (not low-sodium or reduced-sodium). Regular tomato and vegetable juice (not low-sodium or reduced-sodium). Angie Fava. Olives. Fruits Canned fruit in a light or heavy syrup. Fried fruit. Fruit in cream or butter sauce. Meat and other protein foods Fatty cuts of meat. Ribs. Fried meat. Berniece Salines. Sausage. Bologna and other processed lunch meats. Salami. Fatback. Hotdogs. Bratwurst. Salted nuts and seeds. Canned beans with added salt. Canned or  smoked fish. Whole eggs or egg yolks. Chicken or [place] with skin. Dairy Whole or 2% milk, cream, and half-and-half. Whole or full-fat cream cheese. Whole-fat or sweetened yogurt. Full-fat cheese. Nondairy creamers. Whipped toppings. Processed cheese and cheese spreads. Fats and oils Butter. Stick margarine. Lard. Shortening. Ghee. Bacon fat. Tropical oils, such as coconut, palm kernel, or palm oil. Seasoning and other foods Salted popcorn and pretzels. Onion salt, garlic salt, seasoned salt, table salt, and sea salt. Worcestershire sauce. Tartar sauce. Barbecue sauce. Teriyaki sauce. Soy sauce, including reduced-sodium. Steak sauce. Canned and packaged gravies. Fish sauce. Oyster sauce. Cocktail sauce. Horseradish that you find on the shelf. Ketchup. Mustard. Meat flavorings and tenderizers. Bouillon cubes. Hot sauce and Tabasco sauce. Premade or packaged marinades. Premade or packaged taco seasonings. Relishes. Regular salad dressings. Where to find more information:  National Heart, Lung, and [place]: https://wilson-eaton.com/  American Heart Association: www.heart.org Summary  The DASH eating plan is a healthy eating plan that has been shown to reduce high blood pressure (hypertension). It may also reduce your risk for type 2 diabetes, heart disease, and stroke.  With the DASH eating plan, you should limit salt (sodium) intake to 2,300 mg a day. If you have hypertension, you may need to reduce your sodium intake to 1,500 mg a day.  When on the DASH eating plan,  aim to eat more fresh fruits and vegetables, whole grains, lean proteins, low-fat dairy, and heart-healthy fats.  Work with your health care provider or diet and nutrition specialist (dietitian) to adjust your eating plan to your individual calorie needs. This information is not intended to replace advice given to you by your health care provider. Make sure you discuss any questions you have with your health care provider. Document  Released: 12/01/2011 Document Revised: 12/05/2016 Document Reviewed: 12/05/2016 Elsevier Interactive Patient Education  [employer].

## 2018-04-27 ENCOUNTER — Ambulatory Visit (INDEPENDENT_AMBULATORY_CARE_PROVIDER_SITE_OTHER): Payer: Commercial Managed Care - PPO | Admitting: Podiatry

## 2018-04-27 ENCOUNTER — Ambulatory Visit (INDEPENDENT_AMBULATORY_CARE_PROVIDER_SITE_OTHER): Payer: Commercial Managed Care - PPO

## 2018-04-27 ENCOUNTER — Encounter: Payer: Self-pay | Admitting: Podiatry

## 2018-04-27 DIAGNOSIS — M722 Plantar fascial fibromatosis: Secondary | ICD-10-CM

## 2018-04-27 DIAGNOSIS — M7662 Achilles tendinitis, left leg: Secondary | ICD-10-CM | POA: Diagnosis not present

## 2018-04-27 DIAGNOSIS — M7661 Achilles tendinitis, right leg: Secondary | ICD-10-CM | POA: Diagnosis not present

## 2018-04-27 MED ORDER — METHYLPREDNISOLONE 4 MG PO TBPK
ORAL_TABLET | ORAL | 0 refills | Status: DC
Start: 2018-04-27 — End: 2018-07-09

## 2018-04-30 NOTE — Progress Notes (Signed)
   HPI: 47 year old female with PMHx of DM presenting today as a new patient with a chief complaint of pain to the plantar aspect and posterior heels bilaterally that began over 6 months ago. Walking and standing for long periods of time increases the pain. She has been taking Meloxicam she received from her PCP as treatment with some relief. Patient is here for further evaluation and treatment.   Past Medical History:  Diagnosis Date  . Acanthosis nigricans   . Anemia   . Diabetes mellitus without complication [facility])    patient unaware  . Fibroids   . Fibroids   . Headache   . Iron deficiency anemia       Physical Exam: General: The patient is alert and oriented x3 in no acute distress.  Dermatology: Skin is warm, dry and supple bilateral lower extremities. Negative for open lesions or macerations.  Vascular: Palpable pedal pulses bilaterally. No edema or erythema noted. Capillary refill within normal limits.  Neurological: Epicritic and protective threshold grossly intact bilaterally.   Musculoskeletal Exam: Pain on palpation noted to the posterior tubercle of the bilateral calcaneus at the insertion of the Achilles tendon consistent with retrocalcaneal bursitis. Range of motion within normal limits. Muscle strength 5/5 in all muscle groups bilateral lower extremities.  Radiographic Exam:  Posterior calcaneal spur noted to the respective calcaneus on lateral view. No fracture or dislocation noted. Normal osseous mineralization noted.     Assessment: 1. Insertional Achilles tendinitis bilateral - laterally  2. Retrocalcaneal bursitis   Plan of Care:  1. Patient was evaluated. Radiographs were reviewed today. 2. Injection of 0.5 mL Celestone Soluspan injected into the retrocalcaneal bursa bilaterally. Care was taken to avoid direct injection into the Achilles tendon. 3. Prescription for Medrol Dose Pak provided to patient.  4. Continue taking Meloxicam from PCP.  5.  Recommended good shoe gear.  6. Return to clinic in 4 weeks.   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    Edrick Kins, DPM [facility]  Dr. Edrick Kins, [address]                                        [place], [place] [address]                Office [phone]  Fax [phone]

## 2018-05-25 ENCOUNTER — Ambulatory Visit (INDEPENDENT_AMBULATORY_CARE_PROVIDER_SITE_OTHER): Payer: Commercial Managed Care - PPO | Admitting: Podiatry

## 2018-05-25 ENCOUNTER — Encounter: Payer: Self-pay | Admitting: Podiatry

## 2018-05-25 DIAGNOSIS — M7662 Achilles tendinitis, left leg: Secondary | ICD-10-CM

## 2018-05-25 DIAGNOSIS — M7661 Achilles tendinitis, right leg: Secondary | ICD-10-CM

## 2018-05-28 NOTE — Progress Notes (Signed)
   HPI: 47 year old female with PMHx of DM presenting today for follow up evaluation of bilateral insertional achilles tendinitis. She states the pain had improved until last week when she started hurting again. She states the right is worse than the left. She reports associated swelling. There are no modifying factors noted. Patient is here for further evaluation and treatment.   Past Medical History:  Diagnosis Date  . Acanthosis nigricans   . Anemia   . Diabetes mellitus without complication [facility])    patient unaware  . Fibroids   . Fibroids   . Headache   . Iron deficiency anemia       Physical Exam: General: The patient is alert and oriented x3 in no acute distress.  Dermatology: Skin is warm, dry and supple bilateral lower extremities. Negative for open lesions or macerations.  Vascular: Palpable pedal pulses bilaterally. No edema or erythema noted. Capillary refill within normal limits.  Neurological: Epicritic and protective threshold grossly intact bilaterally.   Musculoskeletal Exam: Pain on palpation noted to the posterior tubercle of the bilateral calcaneus at the insertion of the Achilles tendon consistent with retrocalcaneal bursitis. Range of motion within normal limits. Muscle strength 5/5 in all muscle groups bilateral lower extremities.  Assessment: 1. Insertional Achilles tendinitis bilateral - laterally  2. Retrocalcaneal bursitis   Plan of Care:  1. Patient was evaluated.  2. Injection of 0.5 mL Celestone Soluspan injected into the retrocalcaneal bursa bilaterally. Care was taken to avoid direct injection into the Achilles tendon. 3. Continue taking Meloxicam daily from PCP.  4. Compression anklets dispensed bilaterally.  5. Return to clinic in 4 weeks.   Goes to the beach every weekend.   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    Edrick Kins, DPM [facility]  Dr. Edrick Kins, [address]                                          [place], [place] [address]                Office [phone]  Fax [phone]

## 2018-06-21 ENCOUNTER — Telehealth: Payer: Self-pay | Admitting: Family Medicine

## 2018-06-21 NOTE — Telephone Encounter (Signed)
Copied from [place] [id number]. Topic: Referral - Request >> Jun 21, 2018  4:55 PM Gardiner Ramus wrote: CRM for notification. See Telephone encounter for: 06/21/18.  Reason for CRM: Pt called and stated that she is still having trouble losing weight. She said she has changed her diet and exercise. Still no results. [facility]. Please advise. Cb#[phone] To much pressure on knees.

## 2018-06-22 ENCOUNTER — Encounter: Payer: Commercial Managed Care - PPO | Admitting: Podiatry

## 2018-06-22 NOTE — Telephone Encounter (Signed)
Referral generated

## 2018-06-26 NOTE — Progress Notes (Signed)
This encounter was created in error - please disregard.

## 2018-07-02 ENCOUNTER — Other Ambulatory Visit: Payer: Self-pay | Admitting: Family Medicine

## 2018-07-02 MED ORDER — MELOXICAM 15 MG PO TABS: 15.0000 mg | ORAL_TABLET | Freq: Every day | ORAL | 1 refills | Status: DC

## 2018-07-02 NOTE — Telephone Encounter (Signed)
Copied from [place] [phone]. Topic: Quick Communication - Rx Refill/Question >> Jul 02, 2018 10:42 AM Yvette Rack wrote: Medication: meloxicam (MOBIC) 15 MG tablet   Preferred Pharmacy (with phone number or street name): [id number] [place], [place] [phone] (Phone) [phone] (Fax)   Agent: Please be advised that RX refills may take up to 3 business days. We ask that you follow-up with your pharmacy.

## 2018-07-02 NOTE — Telephone Encounter (Signed)
Meloxicam refill Last OV:04/12/18 Last refill:01/11/18 90 tab/1 refill LWH:[id number] Pharmacy: [id number] [place], [address] [phone] (Phone) [phone] (Fax)

## 2018-07-09 ENCOUNTER — Ambulatory Visit (INDEPENDENT_AMBULATORY_CARE_PROVIDER_SITE_OTHER): Payer: Commercial Managed Care - PPO | Admitting: Family Medicine

## 2018-07-09 ENCOUNTER — Encounter: Payer: Self-pay | Admitting: Family Medicine

## 2018-07-09 VITALS — BP 129/86 | HR 83 | Temp 98.0°F | Ht 64.0 in | Wt 257.2 lb

## 2018-07-09 DIAGNOSIS — S39012A Strain of muscle, fascia and tendon of lower back, initial encounter: Secondary | ICD-10-CM

## 2018-07-09 MED ORDER — HYDROCODONE-ACETAMINOPHEN 5-325 MG PO TABS: 1.0000 | ORAL_TABLET | Freq: Three times a day (TID) | ORAL | 0 refills | Status: DC | PRN

## 2018-07-09 MED ORDER — CYCLOBENZAPRINE HCL 10 MG PO TABS: 10.0000 mg | ORAL_TABLET | Freq: Every evening | ORAL | 0 refills | Status: DC | PRN

## 2018-07-09 MED ORDER — PREDNISONE 10 MG PO TABS: ORAL_TABLET | ORAL | 0 refills | Status: DC

## 2018-07-09 NOTE — Patient Instructions (Signed)
[facility]

## 2018-07-09 NOTE — Progress Notes (Signed)
BP 129/86 (BP Location: Right Arm, Patient Position: Sitting, Cuff Size: Normal)   Pulse 83   Temp 98 F (36.7 C) (Oral)   Ht 5\' 4"  (1.626 m)   Wt 257 lb 3.2 oz (116.7 kg)   LMP  (LMP Unknown)   SpO2 97%   BMI 44.15 kg/m    Subjective:    Patient ID: Amanda Moran, female    DOB: Oct 28, 1971, 47 y.o.   MRN: [id number]  HPI: Amanda Moran is a 47 y.o. female  Chief Complaint  Patient presents with  . 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  Patient states she can't afford to miss work.    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 Left mid back, constant aching and sharp with movement. Movement and coughing make it much worse. Denies radiation, numbness/tingling/weakness of extremities. Meloxicam not helping.   Relevant past medical, surgical, family and social history reviewed and updated as indicated. Interim medical history since our last visit reviewed. Allergies and medications reviewed and updated.  Review of Systems  Per HPI unless specifically indicated above     Objective:    BP 129/86 (BP Location: Right Arm, Patient Position: Sitting, Cuff Size: Normal)   Pulse 83   Temp 98 F (36.7 C) (Oral)   Ht 5\' 4"  (1.626 m)   Wt 257 lb 3.2 oz (116.7 kg)   LMP  (LMP Unknown)   SpO2 97%   BMI 44.15 kg/m   Wt Readings from Last 3 Encounters:  07/09/18 257 lb 3.2 oz (116.7 kg)  04/12/18 259 lb (117.5 kg)  11/10/17 259 lb 3.2 oz (117.6 kg)    Physical Exam  Constitutional: She is oriented to person, place, and time. She appears well-developed and well-nourished. No distress.  HENT:  Head: Atraumatic.  Eyes: Conjunctivae and EOM are normal.  Neck: Normal range of motion. Neck supple.  Cardiovascular: Normal rate and regular rhythm.  Pulmonary/Chest: Effort normal and breath sounds normal.  Musculoskeletal: Normal range of motion. She exhibits tenderness (ttp left midback below scapula. No midline ttp over  spine). She exhibits no edema or deformity.  Neurological: She is alert and oriented to person, place, and time.  All 4 extremities neurovascularly intact  Skin: Skin is warm and dry.  Psychiatric: She has a normal mood and affect. Her behavior is normal.  Nursing note and vitals reviewed.   Results for orders placed or performed during the hospital encounter of 09/28/17  Surgical pathology  Result Value Ref Range   SURGICAL PATHOLOGY      Surgical Pathology CASE: [id number] PATIENT: Tonja Liskey Surgical Pathology Report     SPECIMEN SUBMITTED: A. Breast, left B. Axilla, left  CLINICAL HISTORY: Left mass and prominent LN R/O [facility]  PRE-OPERATIVE DIAGNOSIS: None provided  POST-OPERATIVE DIAGNOSIS: None provided.     DIAGNOSIS: A. BREAST, LEFT; BIOPSY: - FIBROADENOMA. - NEGATIVE FOR ATYPIA AND MALIGNANCY.  B. AXILLARY LYMPH NODE, LEFT; BIOPSY: - BENIGN REACTIVE LYMPH NODE WITH DERMATOPATHIC CHANGES. - NEGATIVE FOR MALIGNANCY.   GROSS DESCRIPTION:  A. The specimen is received in a formalin-filled container labeled with the patient's name and left breast 9:30, 3 cmFN.  Core pieces: multiple, aggregate 1.6 x 0.5 x 0.1 cm Comments: yellow to red lobulated fibrofatty fragment, marked green  Entirely submitted in cassette(s): 1  Time/Date in fixative: collected and placed in formalin at 10:08 AM on 09/28/2017 Total  fixation time: 6.75 hours B. The specimen is received in a formali n-filled container labeled with the patient's name and left axilla.  Core pieces: multiple Measurement: 1.8 x 0.2 x 0.1  cm aggregate Comments: yellow to tan fragments, marked blue  Entirely submitted in cassette(s): 1  Time/Date in fixative: collected and placed in formalin at 10:19 AM on 09/28/2017 Total fixation time: 6.5 hours    Final Diagnosis performed by Quay Burow, MD.  Electronically signed 09/29/2017 9:58:42AM    The electronic signature indicates that the  named Attending Pathologist has evaluated the specimen  Technical component performed at [facility], [address], [place], [place] [address] Lab: [phone] Dir: Darrick Penna. Evette Doffing, MD  Professional component performed at [facility], [facility], [place], [place], [place] [address] Lab: [phone] Dir: Dellia Nims. Reuel Derby, MD     [place] Controlled Substance Database reviewed and appropriate. Controlled substance agreement has been signed and scanned into patient's chart.      Assessment & Plan:   Problem List Items Addressed This Visit    None    Visit Diagnoses    Back strain, initial encounter    -  Primary   Tx with flexeril, prednisone taper, and small supply of norco for severe episodes of pain. Meloxicam and tylenol prn. Stretches, epsom soaks, heating pad       Follow up plan: Return if symptoms worsen or fail to improve.

## 2018-07-16 ENCOUNTER — Ambulatory Visit: Payer: Commercial Managed Care - PPO | Admitting: Family Medicine

## 2018-09-07 ENCOUNTER — Encounter: Payer: Commercial Managed Care - PPO | Admitting: Family Medicine

## 2018-09-11 ENCOUNTER — Encounter: Payer: Commercial Managed Care - PPO | Admitting: Family Medicine

## 2018-11-05 ENCOUNTER — Ambulatory Visit: Payer: Self-pay | Admitting: *Deleted

## 2018-11-05 NOTE — Telephone Encounter (Signed)
Attempted to contact pt regarding symptoms; left message on voicemail [phone]; unable to complete nurse triage at this time.

## 2018-11-05 NOTE — Telephone Encounter (Signed)
Called to set up appointment, pt states she may go to the hospital. Routing to provider as FYI.

## 2018-11-05 NOTE — Telephone Encounter (Signed)
Summary: exposure to strep-sore throat    ** The message may be incomplete. It was sent as a result of a timeout. ** ----- Message from Sheran Luz sent at 11/05/2018 10:33 AM EST ----- Patient states that she has been exposed to strep (grandson) and would like to know if Dr. Wynetta Emery would send in antibiotic to pharmacy. Advised patient that she would need an appointment but patient declined stating that she cannot afford to come in at this time. Patient is experiencing sore throat and a fever of 100 degrees (this morning).       Patient is requesting treatment - she is in between insurance and she can not afford the visit- she is requesting antibiotic for known exposure. Patient has interview Tuesday- she is working through agency now. [facility]. Patient has fever of 100.4 this morning. Her grandson tested positive last week for strep throat.   Reason for Disposition . [1] Sore throat AND [2] strep throat EXPOSURE (i.e., meets definition) within past 10 days  Answer Assessment - Initial Assessment Questions 1. STREP EXPOSURE: "Was the exposure to someone who lives within your home?" If not, ask: "How much contact did you have with the sick individual?"      Grandson lives with her- he was diagnosed last week on Tuesday 2. ONSET: "How many days ago did the contact occur?"      7 days ago 3. PROVEN STREP: "Are you sure the person with strep had a positive throat culture or rapid strep test?"      Father took him- she is not sure of test- but knows the test was positive 4. STREP SYMPTOMS: "Do YOU have a sore throat, fever, or other symptoms suggestive of strep?"      Fever, nausea, fine bumps on legs, sore throat- she reports these are the same symptoms her grandson had 5. VIRAL SYMPTOMS: "Are there any symptoms of a cold, such as a runny nose, cough, hoarse voice?"     no 6. PREGNANCY: "Is there any chance you are pregnant?" "When was your last menstrual period?"      n/a  Protocols used: STREP THROAT EXPOSURE-A-AH

## 2018-11-05 NOTE — Telephone Encounter (Signed)
Needs an appointment.

## 2018-11-05 NOTE — Telephone Encounter (Signed)
Could someone from up front schedule this appt?

## 2018-11-09 IMAGING — MG MM DIGITAL DIAGNOSTIC UNILAT*L* W/ TOMO W/ CAD
5 series · 6 of 9 positions shown · non-contrast
Comparison: Previous exam(s).

CLINICAL DATA: Callback from baseline screening mammogram for
possible left breast asymmetry

EXAM:
2D DIGITAL DIAGNOSTIC LEFT MAMMOGRAM WITH CAD AND ADJUNCT TOMO
ULTRASOUND LEFT BREAST

[L CC synth-2D]
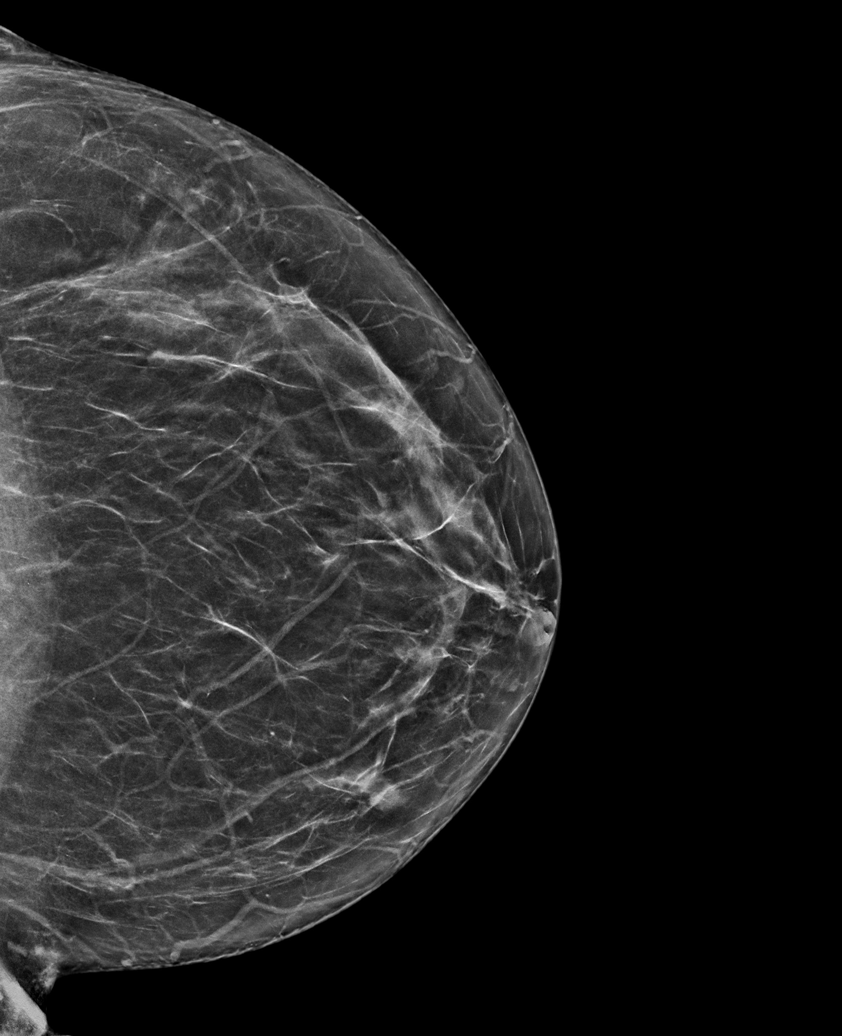

[L MLO]
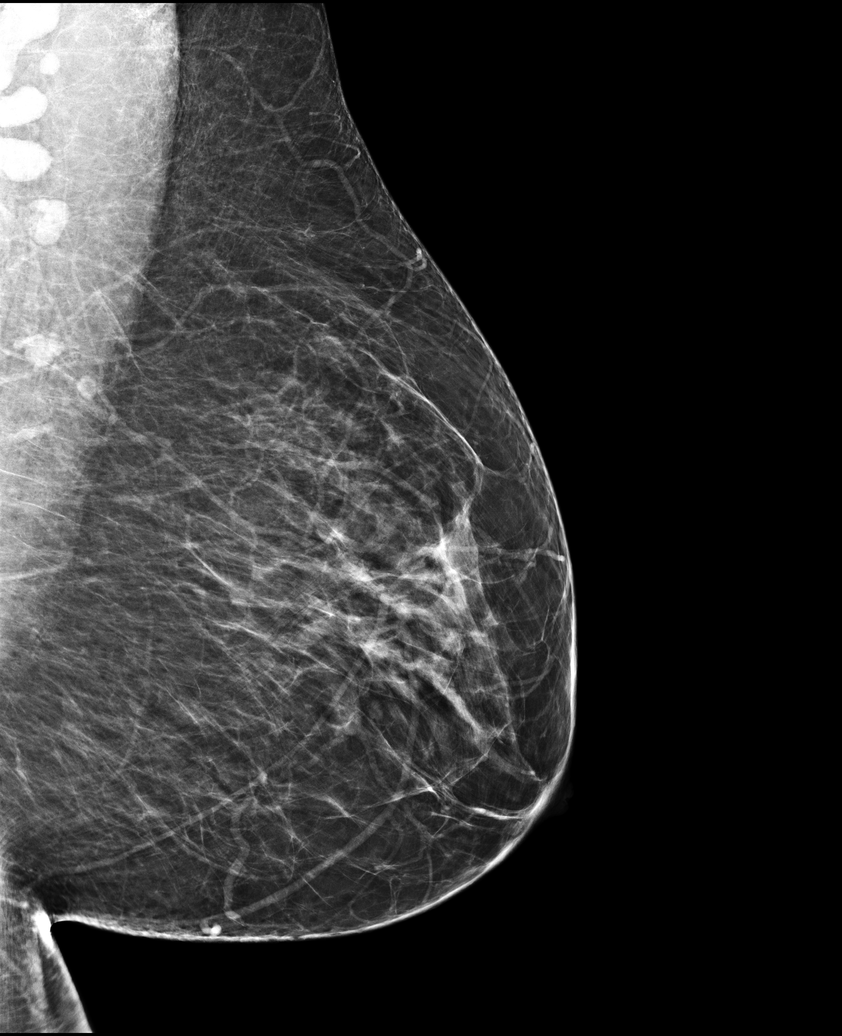

[L CC]
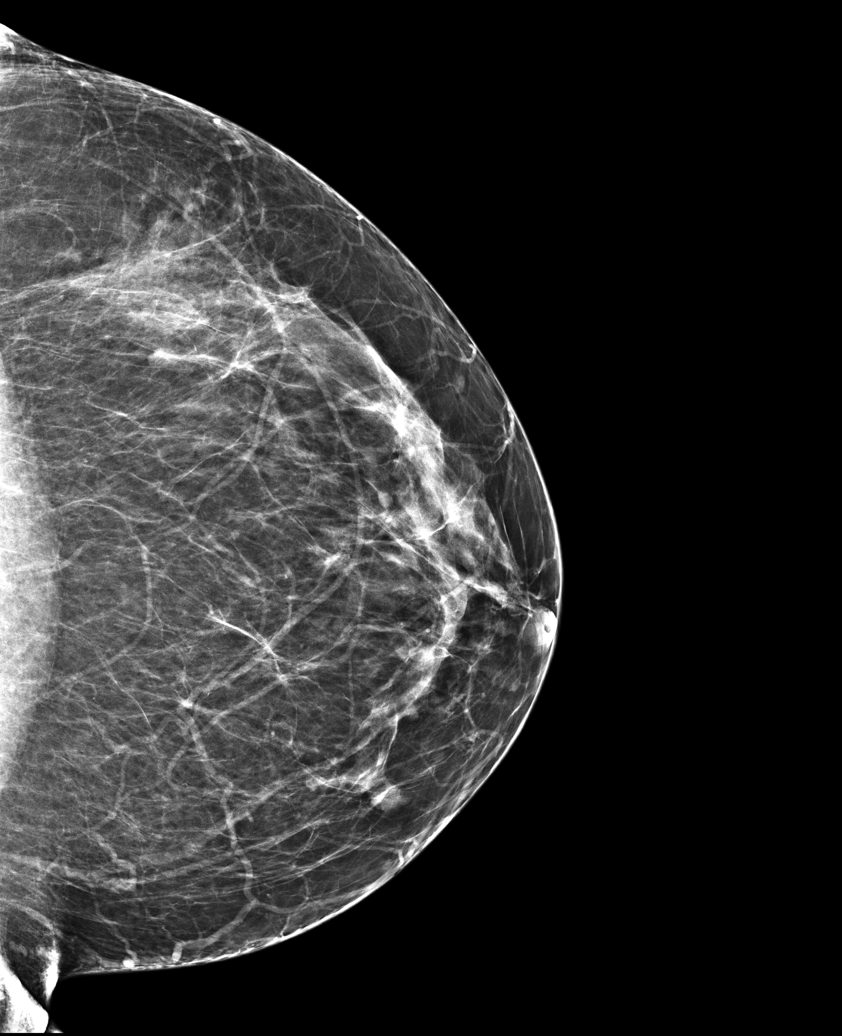

[L MLO synth-2D]
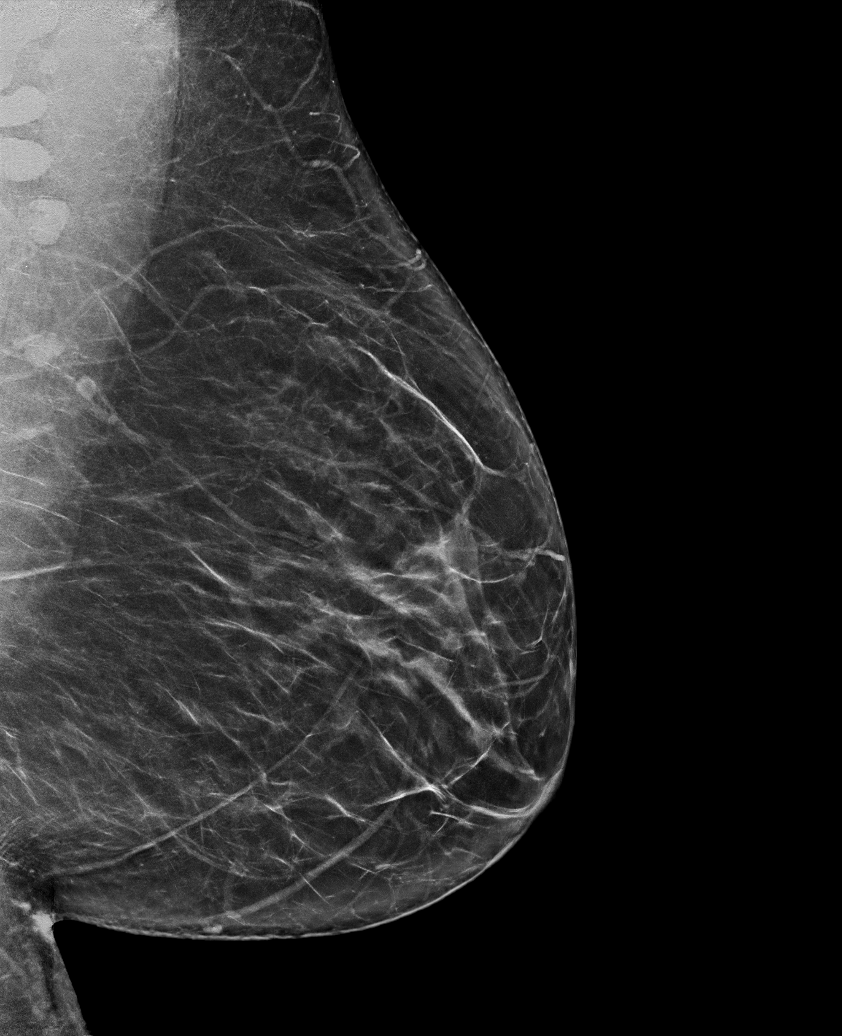

[L CC tomo · 2 of 69 frames shown]
[frame 23/69]
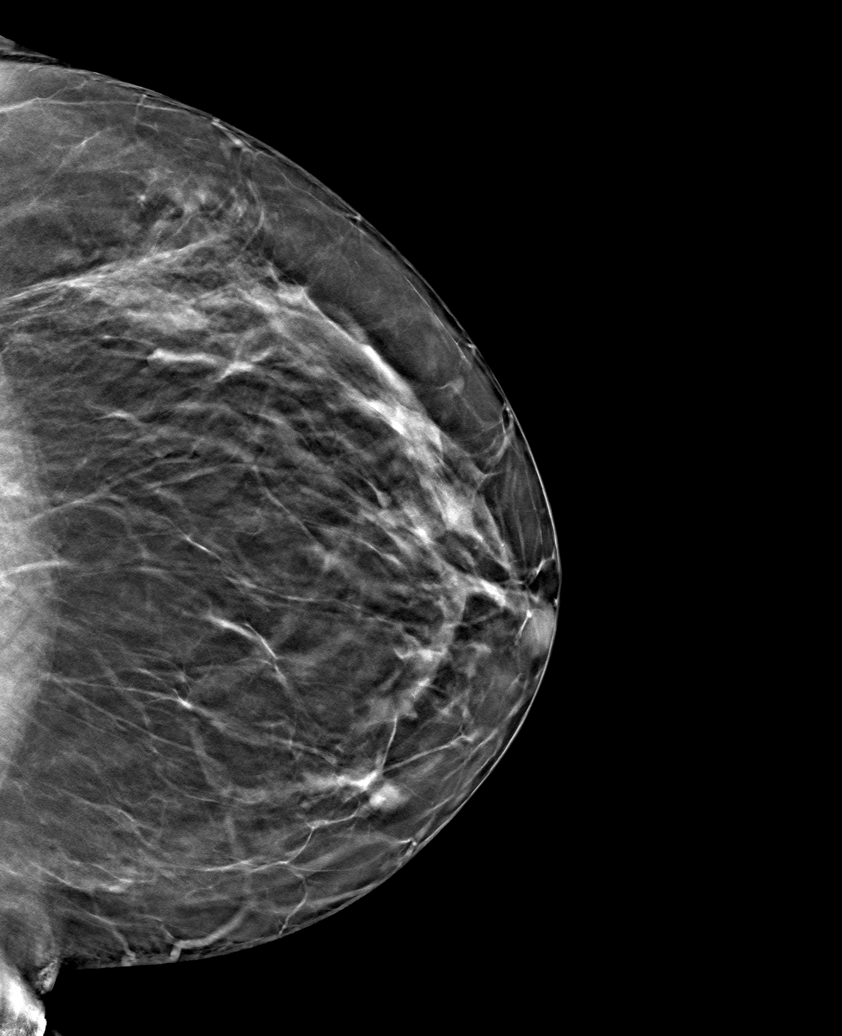
[frame 35/69]
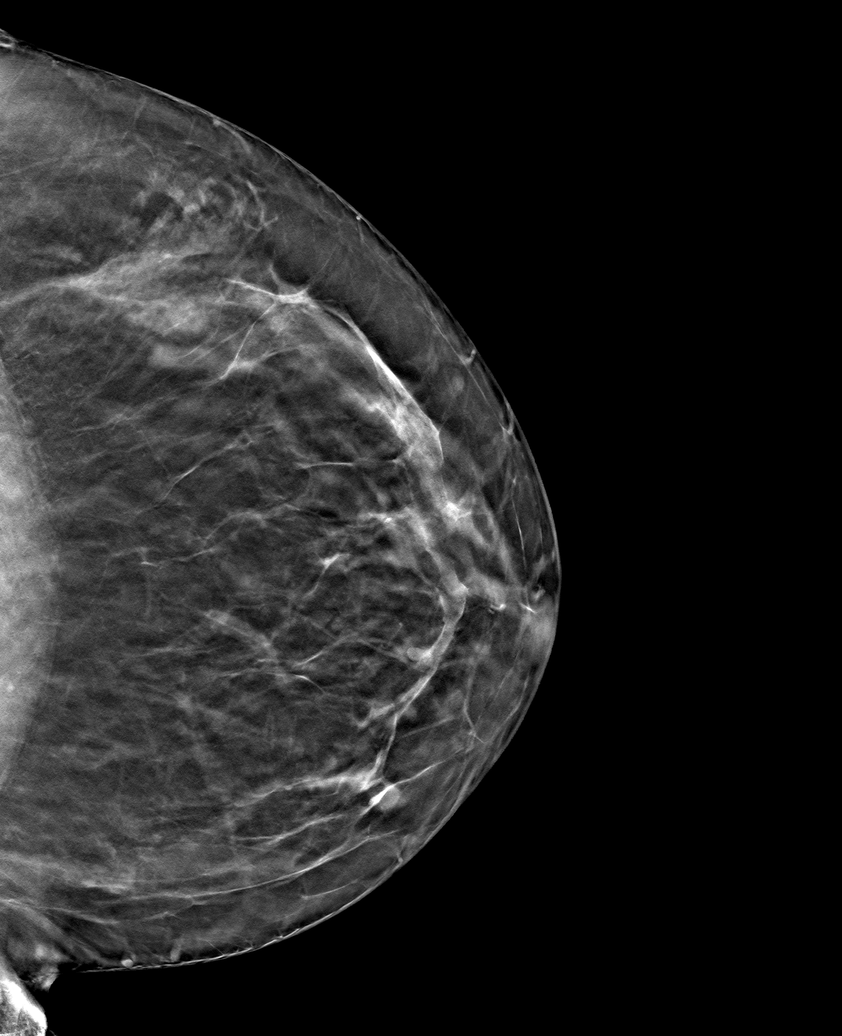

[6 of 9 positions shown; findings below may reference images not displayed]

ACR Breast Density Category c: The breast tissue is heterogeneously
dense, which may obscure small masses.
FINDINGS: On the additional views, there is an oval mass with obscured margins
in the medial left breast, corresponding to the possible asymmetry
seen on screening mammogram. Multiple smaller oval, circumscribed
low-density masses are noted scattered throughout the imaged breast.
Several prominent left axillary lymph nodes are noted.

Mammographic images were processed with CAD.

Targeted ultrasound of the medial left breast demonstrates an oval,
hypoechoic mass with indistinct margins at [DATE], 3 cm from the
nipple measuring 8 x 5 x 10 mm, corresponding to the mass seen
mammographically within the medial left breast. Targeted ultrasound
of the left axilla demonstrates mild diffuse cortical thickening of
several axillary lymph nodes measuring up to 4 mm in thickness.
IMPRESSION: 1. Indeterminate left breast mass at [DATE], 3 cm from the nipple.
2. Indeterminate left axillary lymph nodes.

RECOMMENDATION:
1. Ultrasound-guided biopsy of the left breast mass at [DATE], 3 cm
from the nipple.
2. Ultrasound-guided biopsy of one of the left axillary lymph nodes
demonstrating mild cortical thickening.
If either biopsy demonstrates malignancy, consider MRI for further
evaluation of extent of disease given the multiple additional
smaller oval masses seen throughout the left breast.

I have discussed the findings and recommendations with the patient.
Results were also provided in writing at the conclusion of the
visit. If applicable, a reminder letter will be sent to the patient
regarding the next appointment.

BI-RADS CATEGORY  4: Suspicious.

## 2019-01-08 ENCOUNTER — Other Ambulatory Visit: Payer: Self-pay | Admitting: Family Medicine

## 2019-02-26 ENCOUNTER — Ambulatory Visit: Payer: Self-pay

## 2019-02-26 NOTE — Telephone Encounter (Signed)
Pt c/o foot pain for the past year. Pt stated that she was seen by podiatrist. He gave her cortisone injection, inserts and special shoe she can wear. Pt stated she was diagnosed with plantar fascitis.  Pt stated that the pain is at the back of both heels where the achilles tendon is. Pt stated she is having edema to both ankles. Pt stated when she begins to walk after sitting or waking in the morning she can barely walk and the pain is severe.  She works on her feet for 8 hours and after she drives home she can barely walk in the house. Pt stated she voided on herself because her feels hurt so bad she could not walk fast enough. Pt is taking Meloxicam and acetaminophen. Pt stated the medicine helps but not very long. Pt wears compression stockings. Pt given an appt tomorrow with Merrie Roof PA tomorrow. Canceling March 12 th appt. Pt stated she cannot wait until that date.  Reason for Disposition . [1] MODERATE pain (e.g., interferes with normal activities, limping) AND [2] present > 3 days  Answer Assessment - Initial Assessment Questions 1. ONSET: "When did the pain start?"      1 year  2. LOCATION: "Where is the pain located?"      Both achilles 3. PAIN: "How bad is the pain?"    (Scale 1-10; or mild, moderate, severe)  - MILD (1-3): doesn't interfere with normal activities   - MODERATE (4-7): interferes with normal activities (e.g., work or school) or awakens from sleep, limping   - SEVERE (8-10): excruciating pain, unable to do any normal activities, unable to walk     After work and getting out of bed in the morning it is a ten 4. WORK OR EXERCISE: "Has there been any recent work or exercise that involved this part of the body?"      Works 8 hours  5. CAUSE: "What do you think is causing the ankle pain?"     Achilles  6. OTHER SYMPTOMS: "Do you have any other symptoms?" (e.g., calf pain, rash, fever, swelling)     Swollen ankles and wears compression stockings 7. PREGNANCY: "Is there any  chance you are pregnant?" "When was your last menstrual period?"     n/a  Protocols used: ANKLE PAIN-A-AH

## 2019-02-27 ENCOUNTER — Encounter: Payer: Self-pay | Admitting: Family Medicine

## 2019-02-27 ENCOUNTER — Ambulatory Visit (INDEPENDENT_AMBULATORY_CARE_PROVIDER_SITE_OTHER): Payer: BLUE CROSS/BLUE SHIELD | Admitting: Family Medicine

## 2019-02-27 VITALS — BP 132/78 | HR 96 | Temp 98.2°F | Ht 64.0 in | Wt 256.0 lb

## 2019-02-27 DIAGNOSIS — M25572 Pain in left ankle and joints of left foot: Secondary | ICD-10-CM | POA: Diagnosis not present

## 2019-02-27 DIAGNOSIS — M25571 Pain in right ankle and joints of right foot: Secondary | ICD-10-CM | POA: Diagnosis not present

## 2019-02-27 DIAGNOSIS — G8929 Other chronic pain: Secondary | ICD-10-CM | POA: Diagnosis not present

## 2019-02-27 MED ORDER — PREDNISONE 10 MG PO TABS: ORAL_TABLET | ORAL | 0 refills | Status: DC

## 2019-02-27 NOTE — Progress Notes (Signed)
BP 132/78   Pulse 96   Temp 98.2 F (36.8 C) (Oral)   Ht 5\' 4"  (1.626 m)   Wt 256 lb (116.1 kg)   LMP  (LMP Unknown)   SpO2 97%   BMI 43.94 kg/m    Subjective:    Patient ID: [name], female    DOB: 1971/10/04, 48 y.o.   MRN: [id number]  HPI: EDITH GROLEAU is a 49 y.o. female  Chief Complaint  Patient presents with  . Joint Swelling  . Ankle Pain    Ongoing x 1 year. Saw specialist stated it was plantar facitis. She is wearing the compression stockings and insole that he recommned and pain continues.    Here today with persistent b/l ankle pain for about 1 year. No known injury, redness, swelling. Was seen several times by Podiatry last year for plantar fasciitis and achilles tenonitis, given steroid injections and meloxicam which seemed to help temporarily. States this pain is a little different than what she was experiencing then. Wears compression stockings most days for LE edema, got supportive shoes and inserts, tried losing weight all with no relief of pain. Still taking meloxicam prn. B/l foot and ankle imaging done through podiatry and not showing obvious cause.   Relevant past medical, surgical, family and social history reviewed and updated as indicated. Interim medical history since our last visit reviewed. Allergies and medications reviewed and updated.  Review of Systems  Per HPI unless specifically indicated above     Objective:    BP 132/78   Pulse 96   Temp 98.2 F (36.8 C) (Oral)   Ht 5\' 4"  (1.626 m)   Wt 256 lb (116.1 kg)   LMP  (LMP Unknown)   SpO2 97%   BMI 43.94 kg/m   Wt Readings from Last 3 Encounters:  02/27/19 256 lb (116.1 kg)  07/09/18 257 lb 3.2 oz (116.7 kg)  04/12/18 259 lb (117.5 kg)    Physical Exam Vitals signs and nursing note reviewed.  Constitutional:      Appearance: Normal appearance. She is obese. She is not ill-appearing.  HENT:     Head: Atraumatic.  Eyes:     Extraocular Movements: Extraocular movements  intact.     Conjunctiva/sclera: Conjunctivae normal.  Neck:     Musculoskeletal: Normal range of motion and neck supple.  Cardiovascular:     Rate and Rhythm: Normal rate and regular rhythm.     Pulses: Normal pulses.     Heart sounds: Normal heart sounds.  Pulmonary:     Effort: Pulmonary effort is normal.     Breath sounds: Normal breath sounds.  Musculoskeletal: Normal range of motion.        General: Swelling (trace edema b/l lower legs, symmetric) and tenderness (ttp b/l ankles, no achilles or heel ttp b/l) present. No deformity or signs of injury.  Skin:    General: Skin is warm and dry.     Findings: No erythema.  Neurological:     Mental Status: She is alert and oriented to person, place, and time.     Sensory: No sensory deficit.     Motor: No weakness.     Gait: Gait normal.  Psychiatric:        Mood and Affect: Mood normal.        Thought Content: Thought content normal.        Judgment: Judgment normal.     Results for orders placed or performed during the hospital  encounter of 09/28/17  Surgical pathology  Result Value Ref Range   SURGICAL PATHOLOGY      Surgical Pathology CASE: [id number] PATIENT: Alida Chavers Surgical Pathology Report     SPECIMEN SUBMITTED: A. Breast, left B. Axilla, left  CLINICAL HISTORY: Left mass and prominent LN R/O [facility]  PRE-OPERATIVE DIAGNOSIS: None provided  POST-OPERATIVE DIAGNOSIS: None provided.     DIAGNOSIS: A. BREAST, LEFT; BIOPSY: - FIBROADENOMA. - NEGATIVE FOR ATYPIA AND MALIGNANCY.  B. AXILLARY LYMPH NODE, LEFT; BIOPSY: - BENIGN REACTIVE LYMPH NODE WITH DERMATOPATHIC CHANGES. - NEGATIVE FOR MALIGNANCY.   GROSS DESCRIPTION:  A. The specimen is received in a formalin-filled container labeled with the patient's name and left breast 9:30, 3 cmFN.  Core pieces: multiple, aggregate 1.6 x 0.5 x 0.1 cm Comments: yellow to red lobulated fibrofatty fragment, marked green  Entirely submitted in  cassette(s): 1  Time/Date in fixative: collected and placed in formalin at 10:08 AM on 09/28/2017 Total fixation time: 6.75 hours B. The specimen is received in a formali n-filled container labeled with the patient's name and left axilla.  Core pieces: multiple Measurement: 1.8 x 0.2 x 0.1  cm aggregate Comments: yellow to tan fragments, marked blue  Entirely submitted in cassette(s): 1  Time/Date in fixative: collected and placed in formalin at 10:19 AM on 09/28/2017 Total fixation time: 6.5 hours    Final Diagnosis performed by Quay Burow, MD.  Electronically signed 09/29/2017 9:58:42AM    The electronic signature indicates that the named Attending Pathologist has evaluated the specimen  Technical component performed at [facility], [address], [place], [place] [address] Lab: [phone] Dir: Darrick Penna. Evette Doffing, MD  Professional component performed at [facility], [facility], [place], [place], [place] [address] Lab: [phone] Dir: Dellia Nims. Reuel Derby, MD        Assessment & Plan:   Problem List Items Addressed This Visit    None    Visit Diagnoses    Chronic pain of both ankles    -  Primary   Suspect arthritic, prednisone taper sent for temporary relief and referral placed to orthopedics for further management given persistent pain sxs   Relevant Orders   AMB referral to orthopedics       Follow up plan: Return for CPE.

## 2019-03-07 ENCOUNTER — Ambulatory Visit: Payer: Self-pay | Admitting: Family Medicine

## 2019-03-11 ENCOUNTER — Other Ambulatory Visit: Payer: Self-pay

## 2019-03-11 ENCOUNTER — Ambulatory Visit (INDEPENDENT_AMBULATORY_CARE_PROVIDER_SITE_OTHER): Payer: BLUE CROSS/BLUE SHIELD | Admitting: Family Medicine

## 2019-03-11 ENCOUNTER — Encounter: Payer: Self-pay | Admitting: Family Medicine

## 2019-03-11 VITALS — BP 118/84 | HR 86 | Temp 98.8°F | Wt 271.8 lb

## 2019-03-11 DIAGNOSIS — Z0000 Encounter for general adult medical examination without abnormal findings: Secondary | ICD-10-CM | POA: Diagnosis not present

## 2019-03-11 DIAGNOSIS — D500 Iron deficiency anemia secondary to blood loss (chronic): Secondary | ICD-10-CM | POA: Diagnosis not present

## 2019-03-11 DIAGNOSIS — S39012A Strain of muscle, fascia and tendon of lower back, initial encounter: Secondary | ICD-10-CM

## 2019-03-11 DIAGNOSIS — R7301 Impaired fasting glucose: Secondary | ICD-10-CM

## 2019-03-11 MED ORDER — CYCLOBENZAPRINE HCL 10 MG PO TABS: 10.0000 mg | ORAL_TABLET | Freq: Every day | ORAL | 1 refills | Status: DC

## 2019-03-11 MED ORDER — MELOXICAM 15 MG PO TABS: 15.0000 mg | ORAL_TABLET | Freq: Every day | ORAL | 3 refills | Status: DC

## 2019-03-11 NOTE — Assessment & Plan Note (Signed)
Encouraged slow, steady weight loss of 1-2lbs a week. Call with any concerns. Continue to monitor.

## 2019-03-11 NOTE — Patient Instructions (Addendum)
[facility]  Address: [address], [place], [place] [address]  Phone: [phone]   Health Maintenance, Female Adopting a healthy lifestyle and getting preventive care can go a long way to promote health and wellness. Talk with your health care provider about what schedule of regular examinations is right for you. This is a good chance for you to check in with your provider about disease prevention and staying healthy. In between checkups, there are plenty of things you can do on your own. Experts have done a lot of research about which lifestyle changes and preventive measures are most likely to keep you healthy. Ask your health care provider for more information. Weight and diet Eat a healthy diet  Be sure to include plenty of vegetables, fruits, low-fat dairy products, and lean protein.  Do not eat a lot of foods high in solid fats, added sugars, or salt.  Get regular exercise. This is one of the most important things you can do for your health. ? Most adults should exercise for at least 150 minutes each week. The exercise should increase your heart rate and make you sweat (moderate-intensity exercise). ? Most adults should also do strengthening exercises at least twice a week. This is in addition to the moderate-intensity exercise. Maintain a healthy weight  Body mass index (BMI) is a measurement that can be used to identify possible weight problems. It estimates body fat based on height and weight. Your health care provider can help determine your BMI and help you achieve or maintain a healthy weight.  For females 20 years of age and older: ? A BMI below 18.5 is considered underweight. ? A BMI of 18.5 to 24.9 is normal. ? A BMI of 25 to 29.9 is considered overweight. ? A BMI of 30 and above is considered obese. Watch levels of cholesterol and blood lipids  You should start having your blood tested for lipids and cholesterol at 47 years of  age, then have this test every 5 years.  You may need to have your cholesterol levels checked more often if: ? Your lipid or cholesterol levels are high. ? You are older than 47 years of age. ? You are at high risk for heart disease. Cancer screening Lung Cancer  Lung cancer screening is recommended for adults 55-80 years old who are at high risk for lung cancer because of a history of smoking.  A yearly low-dose CT scan of the lungs is recommended for people who: ? Currently smoke. ? Have quit within the past 15 years. ? Have at least a 30-pack-year history of smoking. A pack year is smoking an average of one pack of cigarettes a day for 1 year.  Yearly screening should continue until it has been 15 years since you quit.  Yearly screening should stop if you develop a health problem that would prevent you from having lung cancer treatment. Breast Cancer  Practice breast self-awareness. This means understanding how your breasts normally appear and feel.  It also means doing regular breast self-exams. Let your health care provider know about any changes, no matter how small.  If you are in your 20s or 30s, you should have a clinical breast exam (CBE) by a health care provider every 1-3 years as part of a regular health exam.  If you are 40 or older, have a CBE every year. Also consider having a breast X-ray (mammogram) every year.  If you have a family history of   breast cancer, talk to your health care provider about genetic screening.  If you are at high risk for breast cancer, talk to your health care provider about having an MRI and a mammogram every year.  Breast cancer gene (BRCA) assessment is recommended for women who have family members with BRCA-related cancers. BRCA-related cancers include: ? Breast. ? Ovarian. ? Tubal. ? Peritoneal cancers.  Results of the assessment will determine the need for genetic counseling and BRCA1 and BRCA2 testing. Cervical Cancer Your  health care provider may recommend that you be screened regularly for cancer of the pelvic organs (ovaries, uterus, and vagina). This screening involves a pelvic examination, including checking for microscopic changes to the surface of your cervix (Pap test). You may be encouraged to have this screening done every 3 years, beginning at age 89.  For women ages 69-65, health care providers may recommend pelvic exams and Pap testing every 3 years, or they may recommend the Pap and pelvic exam, combined with testing for human papilloma virus (HPV), every 5 years. Some types of HPV increase your risk of cervical cancer. Testing for HPV may also be done on women of any age with unclear Pap test results.  Other health care providers may not recommend any screening for nonpregnant women who are considered low risk for pelvic cancer and who do not have symptoms. Ask your health care provider if a screening pelvic exam is right for you.  If you have had past treatment for cervical cancer or a condition that could lead to cancer, you need Pap tests and screening for cancer for at least 20 years after your treatment. If Pap tests have been discontinued, your risk factors (such as having a new sexual partner) need to be reassessed to determine if screening should resume. Some women have medical problems that increase the chance of getting cervical cancer. In these cases, your health care provider may recommend more frequent screening and Pap tests. Colorectal Cancer  This type of cancer can be detected and often prevented.  Routine colorectal cancer screening usually begins at 47 years of age and continues through 48 years of age.  Your health care provider may recommend screening at an earlier age if you have risk factors for colon cancer.  Your health care provider may also recommend using home test kits to check for hidden blood in the stool.  A small camera at the end of a tube can be used to examine your  colon directly (sigmoidoscopy or colonoscopy). This is done to check for the earliest forms of colorectal cancer.  Routine screening usually begins at age 21.  Direct examination of the colon should be repeated every 5-10 years through 47 years of age. However, you may need to be screened more often if early forms of precancerous polyps or small growths are found. Skin Cancer  Check your skin from head to toe regularly.  Tell your health care provider about any new moles or changes in moles, especially if there is a change in a mole's shape or color.  Also tell your health care provider if you have a mole that is larger than the size of a pencil eraser.  Always use sunscreen. Apply sunscreen liberally and repeatedly throughout the day.  Protect yourself by wearing long sleeves, pants, a wide-brimmed hat, and sunglasses whenever you are outside. Heart disease, diabetes, and high blood pressure  High blood pressure causes heart disease and increases the risk of stroke. High blood pressure is more likely  to develop in: ? People who have blood pressure in the high end of the normal range (130-139/85-89 mm Hg). ? People who are overweight or obese. ? People who are African American.  If you are 31-52 years of age, have your blood pressure checked every 3-5 years. If you are 5 years of age or older, have your blood pressure checked every year. You should have your blood pressure measured twice-once when you are at a hospital or clinic, and once when you are not at a hospital or clinic. Record the average of the two measurements. To check your blood pressure when you are not at a hospital or clinic, you can use: ? An automated blood pressure machine at a pharmacy. ? A home blood pressure monitor.  If you are between 79 years and 70 years old, ask your health care provider if you should take aspirin to prevent strokes.  Have regular diabetes screenings. This involves taking a blood sample to  check your fasting blood sugar level. ? If you are at a normal weight and have a low risk for diabetes, have this test once every three years after 47 years of age. ? If you are overweight and have a high risk for diabetes, consider being tested at a younger age or more often. Preventing infection Hepatitis B  If you have a higher risk for hepatitis B, you should be screened for this virus. You are considered at high risk for hepatitis B if: ? You were born in a country where hepatitis B is common. Ask your health care provider which countries are considered high risk. ? Your parents were born in a high-risk country, and you have not been immunized against hepatitis B (hepatitis B vaccine). ? You have HIV or AIDS. ? You use needles to inject street drugs. ? You live with someone who has hepatitis B. ? You have had sex with someone who has hepatitis B. ? You get hemodialysis treatment. ? You take certain medicines for conditions, including cancer, organ transplantation, and autoimmune conditions. Hepatitis C  Blood testing is recommended for: ? Everyone born from [birth year] through 1964. ? Anyone with known risk factors for hepatitis C. Sexually transmitted infections (STIs)  You should be screened for sexually transmitted infections (STIs) including gonorrhea and chlamydia if: ? You are sexually active and are younger than 47 years of age. ? You are older than 47 years of age and your health care provider tells you that you are at risk for this type of infection. ? Your sexual activity has changed since you were last screened and you are at an increased risk for chlamydia or gonorrhea. Ask your health care provider if you are at risk.  If you do not have HIV, but are at risk, it may be recommended that you take a prescription medicine daily to prevent HIV infection. This is called pre-exposure prophylaxis (PrEP). You are considered at risk if: ? You are sexually active and do not regularly use  condoms or know the HIV status of your partner(s). ? You take drugs by injection. ? You are sexually active with a partner who has HIV. Talk with your health care provider about whether you are at high risk of being infected with HIV. If you choose to begin PrEP, you should first be tested for HIV. You should then be tested every 3 months for as long as you are taking PrEP. Pregnancy  If you are premenopausal and you may become pregnant, ask your  health care provider about preconception counseling.  If you may become pregnant, take 400 to 800 micrograms (mcg) of folic acid every day.  If you want to prevent pregnancy, talk to your health care provider about birth control (contraception). Osteoporosis and menopause  Osteoporosis is a disease in which the bones lose minerals and strength with aging. This can result in serious bone fractures. Your risk for osteoporosis can be identified using a bone density scan.  If you are 4 years of age or older, or if you are at risk for osteoporosis and fractures, ask your health care provider if you should be screened.  Ask your health care provider whether you should take a calcium or vitamin D supplement to lower your risk for osteoporosis.  Menopause may have certain physical symptoms and risks.  Hormone replacement therapy may reduce some of these symptoms and risks. Talk to your health care provider about whether hormone replacement therapy is right for you. Follow these instructions at home:  Schedule regular health, dental, and eye exams.  Stay current with your immunizations.  Do not use any tobacco products including cigarettes, chewing tobacco, or electronic cigarettes.  If you are pregnant, do not drink alcohol.  If you are breastfeeding, limit how much and how often you drink alcohol.  Limit alcohol intake to no more than 1 drink per day for nonpregnant women. One drink equals 12 ounces of beer, 5 ounces of wine, or 1 ounces of  hard liquor.  Do not use street drugs.  Do not share needles.  Ask your health care provider for help if you need support or information about quitting drugs.  Tell your health care provider if you often feel depressed.  Tell your health care provider if you have ever been abused or do not feel safe at home. This information is not intended to replace advice given to you by your health care provider. Make sure you discuss any questions you have with your health care provider. Document Released: 06/27/2011 Document Revised: 05/19/2016 Document Reviewed: 09/15/2015 Elsevier Interactive Patient Education  2019 [employer].

## 2019-03-11 NOTE — Progress Notes (Signed)
BP 118/84   Pulse 86   Temp 98.8 F (37.1 C) (Oral)   Wt 271 lb 12.8 oz (123.3 kg)   LMP  (LMP Unknown)   SpO2 96%   BMI 46.65 kg/m    Subjective:    Patient ID: Amanda Moran, female    DOB: 07/10/1971, 48 y.o.   MRN: [id number]  HPI: [name] is a 48 y.o. female presenting on 03/11/2019 for comprehensive medical examination. Current medical complaints include:  Having pain in her ankles- seeing ortho next week.   Impaired Fasting Glucose HbA1C:  Lab Results  Component Value Date   HGBA1C 5.6 08/29/2017   Duration of elevated blood sugar: chronic Polydipsia: no Polyuria: yes Weight change: yes Visual disturbance: no Glucose Monitoring: no    Accucheck frequency: Not Checking Diabetic Education: Not Completed Family history of diabetes: yes  ANEMIA Anemia status: controlled Etiology of anemia: iron deficiency Duration of anemia treatment: chronic Compliance with treatment: good compliance Iron supplementation side effects: no Severity of anemia: moderate Fatigue: yes Decreased exercise tolerance: no  Dyspnea on exertion: no Palpitations: no Bleeding: no Pica: no  Menopausal Symptoms: no  Depression Screen done today and results listed below:  Depression screen [facility] 2/9 03/11/2019 08/29/2017 08/22/2016  Decreased Interest 0 0 0  Down, Depressed, Hopeless 0 0 0  PHQ - 2 Score 0 0 0  Altered sleeping 0 - -  Tired, decreased energy 0 - -  Change in appetite 0 - -  Feeling bad or failure about yourself  0 - -  Trouble concentrating 0 - -  Moving slowly or fidgety/restless 0 - -  Suicidal thoughts 0 - -  PHQ-9 Score 0 - -  Difficult doing work/chores Not difficult at all - -    Past Medical History:  Past Medical History:  Diagnosis Date  . Abnormal uterine bleeding 05/20/2015   Overview:  Hgb 7.9 on 5/19 preoperatively. At [facility] preop.   . Acanthosis nigricans   . Anemia   . Diabetes mellitus without complication [facility])    patient unaware  .  Fibroids   . Fibroids   . Headache   . Iron deficiency anemia     Surgical History:  Past Surgical History:  Procedure Laterality Date  . ABDOMINAL HYSTERECTOMY  05/29/2015   Procedure: HYSTERECTOMY ABDOMINAL;  Surgeon: Ruffin Frederick, MD;  Location: [facility] ORS;  Service: Gynecology;;  . Lorin [facility]cy    . KNEE ARTHROSCOPY Right   . KNEE ARTHROSCOPY WITH ANTERIOR CRUCIATE LIGAMENT (ACL) REPAIR WITH HAMSTRING GRAFT Right 02/14/2017   Procedure: KNEE ARTHROSCOPY WITH ANTERIOR CRUCIATE LIGAMENT (ACL) REPAIR WITH HAMSTRING GRAFT;  Surgeon: Thornton Park, MD;  Location: [facility] ORS;  Service: Orthopedics;  Laterality: Right;  . LAPAROSCOPIC SUPRACERVICAL HYSTERECTOMY N/A 05/29/2015   Procedure: LAPAROSCOPIC SUPRACERVICAL HYSTERECTOMY;  Surgeon: Ruffin Frederick, MD;  Location: [facility] ORS;  Service: Gynecology;  Laterality: N/A;  attempted  . TUBAL LIGATION      Medications:  Current Outpatient Medications on File Prior to Visit  Medication Sig  . acetaminophen (TYLENOL) 500 MG tablet Take 500 mg by mouth every 6 (six) hours as needed.   No current facility-administered medications on file prior to visit.     Allergies:  No Known Allergies  Social History:  Social History   Socioeconomic History  . Marital status: Married    Spouse name: Not on file  . Number of children: Not on file  . Years of education: Not on file  .  Highest education level: Not on file  Occupational History  . Not on file  Social Needs  . Financial resource strain: Not on file  . Food insecurity:    Worry: Not on file    Inability: Not on file  . Transportation needs:    Medical: Not on file    Non-medical: Not on file  Tobacco Use  . Smoking status: Current Every Day Smoker    Packs/day: 1.00    Types: Cigarettes  . Smokeless tobacco: Never Used  Substance and Sexual Activity  . Alcohol use: Yes    Comment: socially  . Drug use: No  . Sexual activity: Yes  Lifestyle  . Physical  activity:    Days per week: Not on file    Minutes per session: Not on file  . Stress: Not on file  Relationships  . Social connections:    Talks on phone: Not on file    Gets together: Not on file    Attends religious service: Not on file    Active member of club or organization: Not on file    Attends meetings of clubs or organizations: Not on file    Relationship status: Not on file  . Intimate partner violence:    Fear of current or ex partner: Not on file    Emotionally abused: Not on file    Physically abused: Not on file    Forced sexual activity: Not on file  Other Topics Concern  . Not on file  Social History Narrative  . Not on file   Social History   Tobacco Use  Smoking Status Current Every Day Smoker  . Packs/day: 1.00  . Types: Cigarettes  Smokeless Tobacco Never Used   Social History   Substance and Sexual Activity  Alcohol Use Yes   Comment: socially    Family History:  Family History  Problem Relation Age of Onset  . Alcohol abuse Father   . Arthritis Father   . Diabetes Father   . Drug abuse Father   . Cancer Father        pancreatic  . Diabetes Brother   . Cancer Mother        cervical    Past medical history, surgical history, medications, allergies, family history and social history reviewed with patient today and changes made to appropriate areas of the chart.   Review of Systems  Constitutional: Positive for diaphoresis (waking her up- not bothering her that much). Negative for chills, fever, malaise/fatigue and weight loss.  HENT: Negative.   Eyes: Negative.   Respiratory: Negative.   Cardiovascular: Negative.   Gastrointestinal: Negative.   Genitourinary: Positive for frequency. Negative for dysuria, flank pain, hematuria and urgency.  Musculoskeletal: Positive for back pain, joint pain and myalgias. Negative for falls and neck pain.  Skin: Negative.   Neurological: Negative.   Endo/Heme/Allergies: Positive for polydipsia.  Negative for environmental allergies. Does not bruise/bleed easily.  Psychiatric/Behavioral: Negative.     All other ROS negative except what is listed above and in the HPI.      Objective:    BP 118/84   Pulse 86   Temp 98.8 F (37.1 C) (Oral)   Wt 271 lb 12.8 oz (123.3 kg)   LMP  (LMP Unknown)   SpO2 96%   BMI 46.65 kg/m   Wt Readings from Last 3 Encounters:  03/11/19 271 lb 12.8 oz (123.3 kg)  02/27/19 256 lb (116.1 kg)  07/09/18 257 lb 3.2 oz (116.7  kg)    Physical Exam Vitals signs and nursing note reviewed.  Constitutional:      General: She is not in acute distress.    Appearance: Normal appearance. She is not ill-appearing, toxic-appearing or diaphoretic.  HENT:     Head: Normocephalic and atraumatic.     Right Ear: Tympanic membrane, ear canal and external ear normal. There is no impacted cerumen.     Left Ear: Tympanic membrane, ear canal and external ear normal. There is no impacted cerumen.     Nose: Nose normal. No congestion or rhinorrhea.     Mouth/Throat:     Mouth: Mucous membranes are moist.     Pharynx: Oropharynx is clear. No oropharyngeal exudate or posterior oropharyngeal erythema.  Eyes:     General: No scleral icterus.       Right eye: No discharge.        Left eye: No discharge.     Extraocular Movements: Extraocular movements intact.     Conjunctiva/sclera: Conjunctivae normal.     Pupils: Pupils are equal, round, and reactive to light.  Neck:     Musculoskeletal: Normal range of motion and neck supple. No neck rigidity or muscular tenderness.     Vascular: No carotid bruit.  Cardiovascular:     Rate and Rhythm: Normal rate and regular rhythm.     Pulses: Normal pulses.     Heart sounds: No murmur. No friction rub. No gallop.   Pulmonary:     Effort: Pulmonary effort is normal. No respiratory distress.     Breath sounds: Normal breath sounds. No stridor. No wheezing, rhonchi or rales.  Chest:     Chest wall: No tenderness.  Abdominal:      General: Abdomen is flat. Bowel sounds are normal. There is no distension.     Palpations: Abdomen is soft. There is no mass.     Tenderness: There is no abdominal tenderness. There is no right CVA tenderness, left CVA tenderness, guarding or rebound.     Hernia: No hernia is present.  Genitourinary:    Comments: Breast and pelvic exams deferred with shared decision making Musculoskeletal:        General: No swelling, tenderness, deformity or signs of injury.     Right lower leg: No edema.     Left lower leg: No edema.  Lymphadenopathy:     Cervical: No cervical adenopathy.  Skin:    General: Skin is warm and dry.     Capillary Refill: Capillary refill takes less than 2 seconds.     Coloration: Skin is not jaundiced or pale.     Findings: No bruising, erythema, lesion or rash.  Neurological:     General: No focal deficit present.     Mental Status: She is alert and oriented to person, place, and time. Mental status is at baseline.     Cranial Nerves: No cranial nerve deficit.     Sensory: No sensory deficit.     Motor: No weakness.     Coordination: Coordination normal.     Gait: Gait normal.     Deep Tendon Reflexes: Reflexes normal.  Psychiatric:        Mood and Affect: Mood normal.        Behavior: Behavior normal.        Thought Content: Thought content normal.        Judgment: Judgment normal.     Results for orders placed or performed during the hospital encounter of 09/28/17  Surgical pathology  Result Value Ref Range   SURGICAL PATHOLOGY      Surgical Pathology CASE: [id number] PATIENT: [facility] Surgical Pathology Report     SPECIMEN SUBMITTED: A. Breast, left B. Axilla, left  CLINICAL HISTORY: Left mass and prominent LN R/O [facility]  PRE-OPERATIVE DIAGNOSIS: None provided  POST-OPERATIVE DIAGNOSIS: None provided.     DIAGNOSIS: A. BREAST, LEFT; BIOPSY: - FIBROADENOMA. - NEGATIVE FOR ATYPIA AND MALIGNANCY.  B. AXILLARY LYMPH NODE, LEFT;  BIOPSY: - BENIGN REACTIVE LYMPH NODE WITH DERMATOPATHIC CHANGES. - NEGATIVE FOR MALIGNANCY.   GROSS DESCRIPTION:  A. The specimen is received in a formalin-filled container labeled with the patient's name and left breast 9:30, 3 cmFN.  Core pieces: multiple, aggregate 1.6 x 0.5 x 0.1 cm Comments: yellow to red lobulated fibrofatty fragment, marked green  Entirely submitted in cassette(s): 1  Time/Date in fixative: collected and placed in formalin at 10:08 AM on 09/28/2017 Total fixation time: 6.75 hours B. The specimen is received in a formali n-filled container labeled with the patient's name and left axilla.  Core pieces: multiple Measurement: 1.8 x 0.2 x 0.1  cm aggregate Comments: yellow to tan fragments, marked blue  Entirely submitted in cassette(s): 1  Time/Date in fixative: collected and placed in formalin at 10:19 AM on 09/28/2017 Total fixation time: 6.5 hours    Final Diagnosis performed by Quay Burow, MD.  Electronically signed 09/29/2017 9:58:42AM    The electronic signature indicates that the named Attending Pathologist has evaluated the specimen  Technical component performed at [facility], [address], [place], [place] [address] Lab: [phone] Dir: Darrick Penna. Evette Doffing, MD  Professional component performed at [facility], [facility], [address], [place], [place] [address] Lab: [phone] Dir: Dellia Nims. Reuel Derby, MD        Assessment & Plan:   Problem List Items Addressed This Visit      Endocrine   IFG (impaired fasting glucose)    Checking levels today. Call with any concerns. Continue to monitor.       Relevant Orders   Bayer DCA Hb A1c Waived   Microalbumin, Urine Waived     Other   Iron deficiency anemia due to chronic blood loss    Rechecking labs today. Await results. Call with any concerns.       Relevant Orders   CBC with Differential/Platelet   Iron and TIBC   Ferritin   Morbid obesity ([place])     Encouraged slow, steady weight loss of 1-2lbs a week. Call with any concerns. Continue to monitor.        Other Visit Diagnoses    Routine general medical examination at a health care facility    -  Primary   Vaccines up to date. Screening labs checked today. Pap N/A. Continue diet and exercise. Call with any concerns.    Relevant Orders   CBC with Differential/Platelet   Comprehensive metabolic panel   Lipid Panel w/o Chol/HDL Ratio   TSH   UA/M w/rflx Culture, Routine   Acute myofascial strain of lumbar region, initial encounter       Will treat with flexeril. Call with any concerns.        Follow up plan: No follow-ups on file.   LABORATORY TESTING:  - Pap smear: not applicable  IMMUNIZATIONS:   - Tdap: Tetanus vaccination status reviewed: last tetanus booster within 10 years. - Influenza: Refused - Pneumovax: Up to date - Prevnar: Not applicable  SCREENING: -Mammogram: Ordered today  PATIENT COUNSELING:   Advised to take 1 mg of folate supplement per day if capable of pregnancy.   Sexuality: Discussed sexually transmitted diseases, partner selection, use of condoms, avoidance of unintended pregnancy  and contraceptive alternatives.   Advised to avoid cigarette smoking.  I discussed with the patient that most people either abstain from alcohol or drink within safe limits (<=14/week and <=4 drinks/occasion for males, <=7/weeks and <= 3 drinks/occasion for females) and that the risk for alcohol disorders and other health effects rises proportionally with the number of drinks per week and how often a drinker exceeds daily limits.  Discussed cessation/primary prevention of drug use and availability of treatment for abuse.   Diet: Encouraged to adjust caloric intake to maintain  or achieve ideal body weight, to reduce intake of dietary saturated fat and total fat, to limit sodium intake by avoiding high sodium foods and not adding table salt, and to maintain adequate  dietary potassium and calcium preferably from fresh fruits, vegetables, and low-fat dairy products.    stressed the importance of regular exercise  Injury prevention: Discussed safety belts, safety helmets, smoke detector, smoking near bedding or upholstery.   Dental health: Discussed importance of regular tooth brushing, flossing, and dental visits.    NEXT PREVENTATIVE PHYSICAL DUE IN 1 YEAR. No follow-ups on file.

## 2019-03-11 NOTE — Assessment & Plan Note (Signed)
Rechecking labs today. Await results. Call with any concerns.  

## 2019-03-11 NOTE — Assessment & Plan Note (Signed)
Checking levels today. Call with any concerns. Continue to monitor.

## 2019-03-12 ENCOUNTER — Other Ambulatory Visit: Payer: Self-pay | Admitting: Family Medicine

## 2019-03-12 DIAGNOSIS — Z1231 Encounter for screening mammogram for malignant neoplasm of breast: Secondary | ICD-10-CM

## 2019-03-12 LAB — COMPREHENSIVE METABOLIC PANEL
A/G RATIO: 1.7 (ref 1.2–2.2)
ALBUMIN: 3.8 g/dL (ref 3.8–4.8)
ALT: 18 IU/L (ref 0–32)
AST: 22 IU/L (ref 0–40)
Alkaline Phosphatase: 55 IU/L (ref 39–117)
BUN / CREAT RATIO: 14 (ref 9–23)
BUN: 10 mg/dL (ref 6–24)
CHLORIDE: 105 mmol/L (ref 96–106)
CO2: 24 mmol/L (ref 20–29)
Calcium: 8.5 mg/dL — ABNORMAL LOW (ref 8.7–10.2)
Creatinine, Ser: 0.74 mg/dL (ref 0.57–1.00)
GFR calc non Af Amer: 97 mL/min/{1.73_m2} (ref >59)
GFR, EST AFRICAN AMERICAN: 112 mL/min/{1.73_m2} (ref >59)
Globulin, Total: 2.2 g/dL (ref 1.5–4.5)
Glucose: 95 mg/dL (ref 65–99)
POTASSIUM: 3.7 mmol/L (ref 3.5–5.2)
Sodium: 142 mmol/L (ref 134–144)
TOTAL PROTEIN: 6 g/dL (ref 6.0–8.5)

## 2019-03-12 LAB — CBC WITH DIFFERENTIAL/PLATELET
BASOS: 0 %
Basophils Absolute: 0 10*3/uL (ref 0.0–0.2)
EOS (ABSOLUTE): 0.2 10*3/uL (ref 0.0–0.4)
Eos: 2 %
HEMOGLOBIN: 12.6 g/dL (ref 11.1–15.9)
Hematocrit: 37.5 % (ref 34.0–46.6)
IMMATURE GRANS (ABS): 0 10*3/uL (ref 0.0–0.1)
Immature Granulocytes: 0 %
LYMPHS: 29 %
Lymphocytes Absolute: 2.1 10*3/uL (ref 0.7–3.1)
MCH: 30 pg (ref 26.6–33.0)
MCHC: 33.6 g/dL (ref 31.5–35.7)
MCV: 89 fL (ref 79–97)
Monocytes Absolute: 0.6 10*3/uL (ref 0.1–0.9)
Monocytes: 8 %
NEUTROS ABS: 4.5 10*3/uL (ref 1.4–7.0)
Neutrophils: 61 %
PLATELETS: 194 10*3/uL (ref 150–450)
RBC: 4.2 x10E6/uL (ref 3.77–5.28)
RDW: 14.8 % (ref 11.7–15.4)
WBC: 7.4 10*3/uL (ref 3.4–10.8)

## 2019-03-12 LAB — IRON AND TIBC
IRON SATURATION: 14 % — AB (ref 15–55)
Iron: 52 ug/dL (ref 27–159)
TIBC: 368 ug/dL (ref 250–450)
UIBC: 316 ug/dL (ref 131–425)

## 2019-03-12 LAB — LIPID PANEL W/O CHOL/HDL RATIO
Cholesterol, Total: 206 mg/dL — ABNORMAL HIGH (ref 100–199)
HDL: 49 mg/dL (ref >39)
LDL Calculated: 128 mg/dL — ABNORMAL HIGH (ref 0–99)
Triglycerides: 147 mg/dL (ref 0–149)
VLDL Cholesterol Cal: 29 mg/dL (ref 5–40)

## 2019-03-12 LAB — TSH: TSH: 1.42 u[IU]/mL (ref 0.450–4.500)

## 2019-03-12 LAB — FERRITIN: FERRITIN: 48 ng/mL (ref 15–150)

## 2019-03-13 LAB — MICROSCOPIC EXAMINATION: RBC, UA: NONE SEEN /hpf (ref 0–2)

## 2019-03-13 LAB — UA/M W/RFLX CULTURE, ROUTINE
BILIRUBIN UA: NEGATIVE
GLUCOSE, UA: NEGATIVE
KETONES UA: NEGATIVE
NITRITE UA: NEGATIVE
PROTEIN UA: NEGATIVE
RBC, UA: NEGATIVE
Urobilinogen, Ur: 0.2 mg/dL (ref 0.2–1.0)
pH, UA: 6 (ref 5.0–7.5)

## 2019-03-13 LAB — URINE CULTURE, REFLEX

## 2019-03-13 LAB — MICROALBUMIN, URINE WAIVED
CREATININE, URINE WAIVED: 300 mg/dL (ref 10–300)
MICROALB, UR WAIVED: 30 mg/L — AB (ref 0–19)
Microalb/Creat Ratio: <30 mg/g (ref <30)

## 2019-03-13 LAB — BAYER DCA HB A1C WAIVED: HB A1C (BAYER DCA - WAIVED): 5.8 % (ref <7.0)

## 2019-03-20 DIAGNOSIS — M25572 Pain in left ankle and joints of left foot: Secondary | ICD-10-CM | POA: Diagnosis not present

## 2019-03-20 DIAGNOSIS — M25571 Pain in right ankle and joints of right foot: Secondary | ICD-10-CM | POA: Diagnosis not present

## 2019-04-25 ENCOUNTER — Telehealth: Payer: Self-pay | Admitting: Family Medicine

## 2019-04-25 NOTE — Telephone Encounter (Signed)
Noted. We are not testing.

## 2019-04-25 NOTE — Telephone Encounter (Signed)
Pt. Calling to report she works at [employer] and several residents have tested positive for COVID 19. States "they were supposed to start testing the employees today, but they haven't yet." Asking if the practice is testing. Informed her they are not at this time. Reports she does not have symptoms, but is concerned about taking the virus home to her family.

## 2019-04-29 ENCOUNTER — Ambulatory Visit (INDEPENDENT_AMBULATORY_CARE_PROVIDER_SITE_OTHER): Payer: BLUE CROSS/BLUE SHIELD | Admitting: Family Medicine

## 2019-04-29 ENCOUNTER — Encounter: Payer: Self-pay | Admitting: Family Medicine

## 2019-04-29 ENCOUNTER — Other Ambulatory Visit: Payer: Self-pay

## 2019-04-29 DIAGNOSIS — F419 Anxiety disorder, unspecified: Secondary | ICD-10-CM | POA: Diagnosis not present

## 2019-04-29 MED ORDER — HYDROXYZINE HCL 25 MG PO TABS: 25.0000 mg | ORAL_TABLET | Freq: Three times a day (TID) | ORAL | 0 refills | Status: DC | PRN

## 2019-04-29 NOTE — Progress Notes (Signed)
LMP  (LMP Unknown)    Subjective:    Patient ID: Amanda Moran, female    DOB: [birth year], 48 y.o.   MRN: [id number]  HPI: Amanda Moran is a 48 y.o. female  Chief Complaint  Patient presents with  . Anxiety    Patient states she's having severe anxiety and trouble sleeping because she's afraid of getting COVID19. 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 Patient was tested and her test came back negative.   ANXIETY/STRESS- Marty is under an extreme amount of stress. She is very anxious. She works in a long-term care facility and they have a COVID-19 outbreak and she states that they don't have the proper equipment to care for the patients.  Duration: 1 week Anxious mood: yes  Excessive worrying: yes Irritability: yes  Sweating: yes Nausea: no Palpitations:no Hyperventilation: no Panic attacks: yes Agoraphobia: no  Obscessions/compulsions: yes Depressed mood: no Depression screen [facility] 2/9 04/29/2019 03/11/2019 08/29/2017 08/22/2016  Decreased Interest 1 0 0 0  Down, Depressed, Hopeless 0 0 0 0  PHQ - 2 Score 1 0 0 0  Altered sleeping 3 0 - -  Tired, decreased energy 2 0 - -  Change in appetite 2 0 - -  Feeling bad or failure about yourself  0 0 - -  Trouble concentrating 2 0 - -  Moving slowly or fidgety/restless 0 0 - -  Suicidal thoughts 0 0 - -  PHQ-9 Score 10 0 - -  Difficult doing work/chores Somewhat difficult Not difficult at all - -   GAD 7 : Generalized Anxiety Score 04/29/2019  Nervous, Anxious, on Edge 2  Control/stop worrying 3  Worry too much - different things 2  Trouble relaxing 3  Restless 1  Easily annoyed or irritable 1  Afraid - awful might happen 1  Total GAD 7 Score 13  Anxiety Difficulty Somewhat difficult    Anhedonia: no Weight changes: no Insomnia: yes hard to fall asleep and hard to stay asleep Hypersomnia: no Fatigue/loss of energy: yes Feelings of worthlessness: yes Feelings of  guilt: yes Impaired concentration/indecisiveness: no Suicidal ideations: no  Crying spells: yes Recent Stressors/Life Changes: yes   Relationship problems: no   Family stress: yes     Financial stress: yes    Job stress: no    Recent death/loss: no  Relevant past medical, surgical, family and social history reviewed and updated as indicated. Interim medical history since our last visit reviewed. Allergies and medications reviewed and updated.  Review of Systems  Constitutional: Negative.   Respiratory: Negative.   Cardiovascular: Negative.   Musculoskeletal: Negative.   Skin: Negative.   Neurological: Negative.   Psychiatric/Behavioral: Positive for dysphoric mood and sleep disturbance. Negative for agitation, behavioral problems, confusion, decreased concentration, hallucinations, self-injury and suicidal ideas. The patient is nervous/anxious. The patient is not hyperactive.     Per HPI unless specifically indicated above     Objective:    LMP  (LMP Unknown)   Wt Readings from Last 3 Encounters:  03/11/19 271 lb 12.8 oz (123.3 kg)  02/27/19 256 lb (116.1 kg)  07/09/18 257 lb 3.2 oz (116.7 kg)    Physical Exam Vitals signs and nursing note reviewed.  Pulmonary:     Effort: Pulmonary effort is normal. No respiratory distress.     Comments: Speaking in full sentences Neurological:     Mental Status: She is alert.  Psychiatric:  Mood and Affect: Mood normal.        Behavior: Behavior normal.        Thought Content: Thought content normal.        Judgment: Judgment normal.     Results for orders placed or performed in visit on 03/11/19  Microscopic Examination  Result Value Ref Range   WBC, UA 0-5 0 - 5 /hpf   RBC, UA None seen 0 - 2 /hpf   Epithelial Cells (non renal) 0-10 0 - 10 /hpf   Mucus, UA Present Not Estab.   Bacteria, UA Moderate (A) None seen/Few  Urine Culture, Reflex  Result Value Ref Range   Urine Culture, Routine Final report    Organism  ID, Bacteria Comment   Bayer DCA Hb A1c Waived  Result Value Ref Range   HB A1C (BAYER DCA - WAIVED) 5.8 <7.0 %  CBC with Differential/Platelet  Result Value Ref Range   WBC 7.4 3.4 - 10.8 x10E3/uL   RBC 4.20 3.77 - 5.28 x10E6/uL   Hemoglobin 12.6 11.1 - 15.9 g/dL   Hematocrit 37.5 34.0 - 46.6 %   MCV 89 79 - 97 fL   MCH 30.0 26.6 - 33.0 pg   MCHC 33.6 31.5 - 35.7 g/dL   RDW 14.8 11.7 - 15.4 %   Platelets 194 150 - 450 x10E3/uL   Neutrophils 61 Not Estab. %   Lymphs 29 Not Estab. %   Monocytes 8 Not Estab. %   Eos 2 Not Estab. %   Basos 0 Not Estab. %   Neutrophils Absolute 4.5 1.4 - 7.0 x10E3/uL   Lymphocytes Absolute 2.1 0.7 - 3.1 x10E3/uL   Monocytes Absolute 0.6 0.1 - 0.9 x10E3/uL   EOS (ABSOLUTE) 0.2 0.0 - 0.4 x10E3/uL   Basophils Absolute 0.0 0.0 - 0.2 x10E3/uL   Immature Granulocytes 0 Not Estab. %   Immature Grans (Abs) 0.0 0.0 - 0.1 x10E3/uL  Comprehensive metabolic panel  Result Value Ref Range   Glucose 95 65 - 99 mg/dL   BUN 10 6 - 24 mg/dL   Creatinine, Ser 0.74 0.57 - 1.00 mg/dL   GFR calc non Af Amer 97 >59 mL/min/1.73   GFR calc Af Amer 112 >59 mL/min/1.73   BUN/Creatinine Ratio 14 9 - 23   Sodium 142 134 - 144 mmol/L   Potassium 3.7 3.5 - 5.2 mmol/L   Chloride 105 96 - 106 mmol/L   CO2 24 20 - 29 mmol/L   Calcium 8.5 (L) 8.7 - 10.2 mg/dL   Total Protein 6.0 6.0 - 8.5 g/dL   Albumin 3.8 3.8 - 4.8 g/dL   Globulin, Total 2.2 1.5 - 4.5 g/dL   Albumin/Globulin Ratio 1.7 1.2 - 2.2   Bilirubin Total <0.2 0.0 - 1.2 mg/dL   Alkaline Phosphatase 55 39 - 117 IU/L   AST 22 0 - 40 IU/L   ALT 18 0 - 32 IU/L  Lipid Panel w/o Chol/HDL Ratio  Result Value Ref Range   Cholesterol, Total 206 (H) 100 - 199 mg/dL   Triglycerides 147 0 - 149 mg/dL   HDL 49 >39 mg/dL   VLDL Cholesterol Cal 29 5 - 40 mg/dL   LDL Calculated 128 (H) 0 - 99 mg/dL  TSH  Result Value Ref Range   TSH 1.420 0.450 - 4.500 uIU/mL  UA/M w/rflx Culture, Routine  Result Value Ref Range    Specific Gravity, UA >1.030 (H) 1.005 - 1.030   pH, UA 6.0 5.0 - 7.5  Color, UA Yellow Yellow   Appearance Ur Hazy (A) Clear   Leukocytes, UA 1+ (A) Negative   Protein, UA Negative Negative/Trace   Glucose, UA Negative Negative   Ketones, UA Negative Negative   RBC, UA Negative Negative   Bilirubin, UA Negative Negative   Urobilinogen, Ur 0.2 0.2 - 1.0 mg/dL   Nitrite, UA Negative Negative   Microscopic Examination See below:    Urinalysis Reflex Comment   Iron and TIBC  Result Value Ref Range   Total Iron Binding Capacity 368 250 - 450 ug/dL   UIBC 316 131 - 425 ug/dL   Iron 52 27 - 159 ug/dL   Iron Saturation 14 (L) 15 - 55 %  Ferritin  Result Value Ref Range   Ferritin 48 15 - 150 ng/mL  Microalbumin, Urine Waived  Result Value Ref Range   Microalb, Ur Waived 30 (H) 0 - 19 mg/L   Creatinine, Urine Waived 300 10 - 300 mg/dL   Microalb/Creat Ratio <30 <30 mg/g      Assessment & Plan:   Problem List Items Addressed This Visit      Other   Acute anxiety - Primary    Significant anxiety regarding the COVID-19 pandemic and the outbreak at her work. Will put her out of work for 2 weeks at this time. Await FMLA paperwork. Rx for hydroxyzine given to help with anxiety. Recheck 2-3 weeks.       Relevant Medications   hydrOXYzine (ATARAX/VISTARIL) 25 MG tablet       Follow up plan: Return in about 3 weeks (around 05/20/2019) for follow up anxiety.   . This visit was completed via telephone due to the restrictions of the COVID-19 pandemic. All issues as above were discussed and addressed but no physical exam was performed. If it was felt that the patient should be evaluated in the office, they were directed there. The patient verbally consented to this visit. Patient was unable to complete an audio/visual visit due to Lack of equipment. Due to the catastrophic nature of the COVID-19 pandemic, this visit was done through audio contact only. . Location of the patient: home .  Location of the provider: home . Those involved with this call:  . Provider: Park Liter, DO . CMA: Merilyn Baba, CMA . Front Desk/Registration: Don Perking  . Time spent on call: 15 minutes with patient face to face via video conference. More than 50% of this time was spent in counseling and coordination of care. 23 minutes total spent in review of patient's record and preparation of their chart.

## 2019-04-29 NOTE — Assessment & Plan Note (Signed)
Significant anxiety regarding the COVID-19 pandemic and the outbreak at her work. Will put her out of work for 2 weeks at this time. Await FMLA paperwork. Rx for hydroxyzine given to help with anxiety. Recheck 2-3 weeks.

## 2019-05-01 ENCOUNTER — Telehealth: Payer: Self-pay

## 2019-05-01 NOTE — Telephone Encounter (Signed)
Copied from [place] [id number]. Topic: General - Other >> Apr 30, 2019  3:57 PM Nils Flack wrote: Reason for CRM: Amanda Moran is Sending short term disability form to fill out. They need to have a medical diagnosis on it.  It is coming from [employer] ( [employer]) Pt wanted you to know it is coming  Cb is [phone] >> May 01, 2019  8:23 AM Stark Klein wrote: Forwarding FYI

## 2019-05-01 NOTE — Telephone Encounter (Signed)
Will look out for form. Thank you.

## 2019-05-06 NOTE — Telephone Encounter (Signed)
Forms not received yet or in folder as of 05/06/2019 unless one of the other CMA's has the form.

## 2019-05-06 NOTE — Telephone Encounter (Signed)
Patient called to check on her short term disability paperwork. Any update on this form.  She would like a call back  [phone]

## 2019-05-07 ENCOUNTER — Telehealth: Payer: Self-pay | Admitting: Family Medicine

## 2019-05-07 NOTE — Telephone Encounter (Signed)
Called [employer] to get forms as it was only partially coming through. Form has been received and will be given to the provider.

## 2019-05-07 NOTE — Telephone Encounter (Signed)
Copied from [place] [id number]. Topic: General - Other >> May 07, 2019  1:10 PM Gustavus Messing wrote: Reason for CRM: The patient wanted Dr. Wynetta Emery to know that the STD people need a diagnosis on the paperwork

## 2019-05-07 NOTE — Telephone Encounter (Signed)
Dr. 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

## 2019-05-08 DIAGNOSIS — M9262 Juvenile osteochondrosis of tarsus, left ankle: Secondary | ICD-10-CM | POA: Diagnosis not present

## 2019-05-08 DIAGNOSIS — M9261 Juvenile osteochondrosis of tarsus, right ankle: Secondary | ICD-10-CM | POA: Diagnosis not present

## 2019-05-13 ENCOUNTER — Encounter: Payer: Self-pay | Admitting: Family Medicine

## 2019-05-13 NOTE — Telephone Encounter (Signed)
Forms were filled out completely to be faxed last week. She is to be following up with me on 05/16/19 to see if she feels she is able to return to work.

## 2019-05-13 NOTE — Telephone Encounter (Signed)
Letter emailed via fax

## 2019-05-13 NOTE — Telephone Encounter (Signed)
Patient needs a work note until Thurday when she has her appt, the work note ended today.   Can be emailed to [email or web address]

## 2019-05-13 NOTE — Telephone Encounter (Signed)
Patient calling to check status of forms. She is requesting a call back when sent. She also inquired when Dr. Wynetta Emery thinks that pt is able to return to work.    Fax # [phone]

## 2019-05-13 NOTE — Telephone Encounter (Signed)
Letter written, OK to send as needed. On back printer

## 2019-05-15 DIAGNOSIS — M7661 Achilles tendinitis, right leg: Secondary | ICD-10-CM | POA: Diagnosis not present

## 2019-05-15 DIAGNOSIS — M9260 Juvenile osteochondrosis of tarsus, unspecified ankle: Secondary | ICD-10-CM | POA: Diagnosis not present

## 2019-05-15 DIAGNOSIS — M7662 Achilles tendinitis, left leg: Secondary | ICD-10-CM | POA: Diagnosis not present

## 2019-05-16 ENCOUNTER — Encounter: Payer: Self-pay | Admitting: Family Medicine

## 2019-05-16 ENCOUNTER — Other Ambulatory Visit: Payer: Self-pay

## 2019-05-16 ENCOUNTER — Ambulatory Visit (INDEPENDENT_AMBULATORY_CARE_PROVIDER_SITE_OTHER): Payer: BLUE CROSS/BLUE SHIELD | Admitting: Family Medicine

## 2019-05-16 DIAGNOSIS — F419 Anxiety disorder, unspecified: Secondary | ICD-10-CM | POA: Diagnosis not present

## 2019-05-16 NOTE — Assessment & Plan Note (Signed)
Still very anxious, but feels like she needs to go back to work. Cleared to go back to work on 05/21/19. Note given. Continue hydroxyzine as needed. Call with any concerns.

## 2019-05-16 NOTE — Progress Notes (Signed)
LMP  (LMP Unknown)    Subjective:    Patient ID: Amanda Moran, female    DOB: November 04, 1971, 48 y.o.   MRN: [id number]  HPI: Amanda Moran is a 49 y.o. female  Chief Complaint  Patient presents with  . 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 Has been out of work for the last 2.5 weeks. Here for follow up on her anxiety. She notes that she just found out that she has bone spurs on her ankles. She notes that she needs to go back to work. She feels like the hydroxyzine makes her a little drowsy. Duration:better Anxious mood: yes  Excessive worrying: yes Irritability: yes  Sweating: no Nausea: no Palpitations:no Hyperventilation: no Panic attacks: no Agoraphobia: no  Obscessions/compulsions: no Depressed mood: yes Depression screen [facility] 2/9 05/16/2019 04/29/2019 03/11/2019 08/29/2017 08/22/2016  Decreased Interest 1 1 0 0 0  Down, Depressed, Hopeless 0 0 0 0 0  PHQ - 2 Score 1 1 0 0 0  Altered sleeping 1 3 0 - -  Tired, decreased energy 1 2 0 - -  Change in appetite 2 2 0 - -  Feeling bad or failure about yourself  0 0 0 - -  Trouble concentrating 1 2 0 - -  Moving slowly or fidgety/restless 0 0 0 - -  Suicidal thoughts 0 0 0 - -  PHQ-9 Score 6 10 0 - -  Difficult doing work/chores Somewhat difficult Somewhat difficult Not difficult at all - -   Anhedonia: no Weight changes: no Insomnia: no   Hypersomnia: no Fatigue/loss of energy: yes Feelings of worthlessness: no Feelings of guilt: yes Impaired concentration/indecisiveness: no Suicidal ideations: no  Crying spells: yes Recent Stressors/Life Changes: yes   Relationship problems: no   Family stress: no     Financial stress: yes    Job stress: yes    Recent death/loss: no   Relevant past medical, surgical, family and social history reviewed and updated as indicated. Interim medical history since our last visit reviewed. Allergies and medications  reviewed and updated.  Review of Systems  Constitutional: Negative.   Respiratory: Negative.   Cardiovascular: Negative.   Skin: Negative.   Neurological: Negative.   Psychiatric/Behavioral: Positive for dysphoric mood. Negative for agitation, behavioral problems, confusion, decreased concentration, hallucinations, self-injury, sleep disturbance and suicidal ideas. The patient is nervous/anxious. The patient is not hyperactive.     Per HPI unless specifically indicated above     Objective:    LMP  (LMP Unknown)   Wt Readings from Last 3 Encounters:  03/11/19 271 lb 12.8 oz (123.3 kg)  02/27/19 256 lb (116.1 kg)  07/09/18 257 lb 3.2 oz (116.7 kg)    Physical Exam Vitals signs and nursing note reviewed.  Constitutional:      General: She is not in acute distress.    Appearance: Normal appearance. She is not ill-appearing, toxic-appearing or diaphoretic.  HENT:     Head: Normocephalic and atraumatic.     Right Ear: External ear normal.     Left Ear: External ear normal.     Nose: Nose normal.     Mouth/Throat:     Mouth: Mucous membranes are moist.     Pharynx: Oropharynx is clear.  Eyes:     General: No scleral icterus.       Right eye: No discharge.        Left  eye: No discharge.     Conjunctiva/sclera: Conjunctivae normal.     Pupils: Pupils are equal, round, and reactive to light.  Neck:     Musculoskeletal: Normal range of motion.  Pulmonary:     Effort: Pulmonary effort is normal. No respiratory distress.     Comments: Speaking in full sentences Musculoskeletal: Normal range of motion.  Skin:    Coloration: Skin is not jaundiced or pale.     Findings: No bruising, erythema, lesion or rash.  Neurological:     Mental Status: She is alert and oriented to person, place, and time. Mental status is at baseline.  Psychiatric:        Mood and Affect: Mood is anxious.        Behavior: Behavior normal.        Thought Content: Thought content normal.        Judgment:  Judgment normal.    Results for orders placed or performed in visit on 03/11/19  Microscopic Examination  Result Value Ref Range   WBC, UA 0-5 0 - 5 /hpf   RBC, UA None seen 0 - 2 /hpf   Epithelial Cells (non renal) 0-10 0 - 10 /hpf   Mucus, UA Present Not Estab.   Bacteria, UA Moderate (A) None seen/Few  Urine Culture, Reflex  Result Value Ref Range   Urine Culture, Routine Final report    Organism ID, Bacteria Comment   Bayer DCA Hb A1c Waived  Result Value Ref Range   HB A1C (BAYER DCA - WAIVED) 5.8 <7.0 %  CBC with Differential/Platelet  Result Value Ref Range   WBC 7.4 3.4 - 10.8 x10E3/uL   RBC 4.20 3.77 - 5.28 x10E6/uL   Hemoglobin 12.6 11.1 - 15.9 g/dL   Hematocrit 37.5 34.0 - 46.6 %   MCV 89 79 - 97 fL   MCH 30.0 26.6 - 33.0 pg   MCHC 33.6 31.5 - 35.7 g/dL   RDW 14.8 11.7 - 15.4 %   Platelets 194 150 - 450 x10E3/uL   Neutrophils 61 Not Estab. %   Lymphs 29 Not Estab. %   Monocytes 8 Not Estab. %   Eos 2 Not Estab. %   Basos 0 Not Estab. %   Neutrophils Absolute 4.5 1.4 - 7.0 x10E3/uL   Lymphocytes Absolute 2.1 0.7 - 3.1 x10E3/uL   Monocytes Absolute 0.6 0.1 - 0.9 x10E3/uL   EOS (ABSOLUTE) 0.2 0.0 - 0.4 x10E3/uL   Basophils Absolute 0.0 0.0 - 0.2 x10E3/uL   Immature Granulocytes 0 Not Estab. %   Immature Grans (Abs) 0.0 0.0 - 0.1 x10E3/uL  Comprehensive metabolic panel  Result Value Ref Range   Glucose 95 65 - 99 mg/dL   BUN 10 6 - 24 mg/dL   Creatinine, Ser 0.74 0.57 - 1.00 mg/dL   GFR calc non Af Amer 97 >59 mL/min/1.73   GFR calc Af Amer 112 >59 mL/min/1.73   BUN/Creatinine Ratio 14 9 - 23   Sodium 142 134 - 144 mmol/L   Potassium 3.7 3.5 - 5.2 mmol/L   Chloride 105 96 - 106 mmol/L   CO2 24 20 - 29 mmol/L   Calcium 8.5 (L) 8.7 - 10.2 mg/dL   Total Protein 6.0 6.0 - 8.5 g/dL   Albumin 3.8 3.8 - 4.8 g/dL   Globulin, Total 2.2 1.5 - 4.5 g/dL   Albumin/Globulin Ratio 1.7 1.2 - 2.2   Bilirubin Total <0.2 0.0 - 1.2 mg/dL   Alkaline Phosphatase 55 39 -  117 IU/L   AST 22 0 - 40 IU/L   ALT 18 0 - 32 IU/L  Lipid Panel w/o Chol/HDL Ratio  Result Value Ref Range   Cholesterol, Total 206 (H) 100 - 199 mg/dL   Triglycerides 147 0 - 149 mg/dL   HDL 49 >39 mg/dL   VLDL Cholesterol Cal 29 5 - 40 mg/dL   LDL Calculated 128 (H) 0 - 99 mg/dL  TSH  Result Value Ref Range   TSH 1.420 0.450 - 4.500 uIU/mL  UA/M w/rflx Culture, Routine  Result Value Ref Range   Specific Gravity, UA >1.030 (H) 1.005 - 1.030   pH, UA 6.0 5.0 - 7.5   Color, UA Yellow Yellow   Appearance Ur Hazy (A) Clear   Leukocytes, UA 1+ (A) Negative   Protein, UA Negative Negative/Trace   Glucose, UA Negative Negative   Ketones, UA Negative Negative   RBC, UA Negative Negative   Bilirubin, UA Negative Negative   Urobilinogen, Ur 0.2 0.2 - 1.0 mg/dL   Nitrite, UA Negative Negative   Microscopic Examination See below:    Urinalysis Reflex Comment   Iron and TIBC  Result Value Ref Range   Total Iron Binding Capacity 368 250 - 450 ug/dL   UIBC 316 131 - 425 ug/dL   Iron 52 27 - 159 ug/dL   Iron Saturation 14 (L) 15 - 55 %  Ferritin  Result Value Ref Range   Ferritin 48 15 - 150 ng/mL  Microalbumin, Urine Waived  Result Value Ref Range   Microalb, Ur Waived 30 (H) 0 - 19 mg/L   Creatinine, Urine Waived 300 10 - 300 mg/dL   Microalb/Creat Ratio <30 <30 mg/g      Assessment & Plan:   Problem List Items Addressed This Visit      Other   Acute anxiety - Primary    Still very anxious, but feels like she needs to go back to work. Cleared to go back to work on 05/21/19. Note given. Continue hydroxyzine as needed. Call with any concerns.           Follow up plan: Return in about 4 weeks (around 06/13/2019).    . This visit was completed via [employer] due to the restrictions of the COVID-19 pandemic. All issues as above were discussed and addressed. Physical exam was done as above through visual confirmation on Doximity. If it was felt that the patient should be  evaluated in the office, they were directed there. The patient verbally consented to this visit. . Location of the patient: home . Location of the provider: home . Those involved with this call:  . Provider: Park Liter, DO . CMA: Tiffany Reel, CMA . Front Desk/Registration: Don Perking  . Time spent on call: 15 minutes with patient face to face via video conference. More than 50% of this time was spent in counseling and coordination of care. 23 minutes total spent in review of patient's record and preparation of their chart.

## 2019-05-23 DIAGNOSIS — M7661 Achilles tendinitis, right leg: Secondary | ICD-10-CM | POA: Diagnosis not present

## 2019-05-23 DIAGNOSIS — M7662 Achilles tendinitis, left leg: Secondary | ICD-10-CM | POA: Diagnosis not present

## 2019-05-23 DIAGNOSIS — M6281 Muscle weakness (generalized): Secondary | ICD-10-CM | POA: Diagnosis not present

## 2019-05-23 DIAGNOSIS — M79671 Pain in right foot: Secondary | ICD-10-CM | POA: Diagnosis not present

## 2019-05-29 DIAGNOSIS — M7662 Achilles tendinitis, left leg: Secondary | ICD-10-CM | POA: Diagnosis not present

## 2019-05-29 DIAGNOSIS — M7661 Achilles tendinitis, right leg: Secondary | ICD-10-CM | POA: Diagnosis not present

## 2019-05-29 DIAGNOSIS — M9260 Juvenile osteochondrosis of tarsus, unspecified ankle: Secondary | ICD-10-CM | POA: Diagnosis not present

## 2019-05-30 ENCOUNTER — Ambulatory Visit: Payer: Self-pay | Admitting: Family Medicine

## 2019-05-30 ENCOUNTER — Other Ambulatory Visit: Payer: Self-pay | Admitting: Podiatry

## 2019-05-31 ENCOUNTER — Encounter: Payer: Self-pay | Admitting: Family Medicine

## 2019-05-31 ENCOUNTER — Other Ambulatory Visit: Payer: Self-pay

## 2019-05-31 ENCOUNTER — Ambulatory Visit (INDEPENDENT_AMBULATORY_CARE_PROVIDER_SITE_OTHER): Payer: BC Managed Care – PPO | Admitting: Family Medicine

## 2019-05-31 VITALS — BP 136/92 | HR 92 | Temp 98.3°F | Ht 64.0 in | Wt 266.0 lb

## 2019-05-31 DIAGNOSIS — Z01818 Encounter for other preprocedural examination: Secondary | ICD-10-CM

## 2019-05-31 NOTE — Addendum Note (Signed)
Addended by: Valerie Roys on: 05/31/2019 05:06 PM   Modules accepted: Orders

## 2019-05-31 NOTE — Progress Notes (Addendum)
BP (!) 136/92   Pulse 92   Temp 98.3 F (36.8 C) (Oral)   Ht 5\' 4"  (1.626 m)   Wt 266 lb (120.7 kg)   LMP  (LMP Unknown)   SpO2 95%   BMI 45.66 kg/m    Subjective:    Patient ID: Amanda Moran, female    DOB: Apr 29, 1971, 48 y.o.   MRN: [id number]  HPI: Amanda Moran is a 48 y.o. female  Chief Complaint  Patient presents with  . Surgical clearance    left ankle surgery   Scheduled for achilles tendon repair with podiatry on 06/07/19. She has been feeling well. Anxious with going back to work, but otherwise doing well. Has had several surgeries in the past without ever having any issues. Last surgery was on her knee in 2018. She has never had any problems with being extubated. No issues with post-op N/V. No issues with anesthesia. She has always gone home when she was supposed to. She notes that she has no family history of problems with anesthesia. No family history of malignant hyperthermia. She can walk up several flights of stairs without getting SOB. No chest pain. She is otherwise feeling well and looking forward to getting her surgery done.   Active Ambulatory Problems    Diagnosis Date Noted  . IFG (impaired fasting glucose) 08/06/2015  . Iron deficiency anemia due to chronic blood loss 08/06/2015  . Complete tear, knee, anterior cruciate ligament 03/22/2017  . Morbid obesity ([place]) 03/11/2019  . Acute anxiety 04/29/2019   Resolved Ambulatory Problems    Diagnosis Date Noted  . Fibroid uterus 05/29/2015  . Chronic anemia 05/31/2015  . Abnormal uterine bleeding 05/20/2015   Past Medical History:  Diagnosis Date  . Acanthosis nigricans   . Anemia   . Diabetes mellitus without complication ([place])   . Fibroids   . Fibroids   . Headache   . Iron deficiency anemia    Past Surgical History:  Procedure Laterality Date  . ABDOMINAL HYSTERECTOMY  05/29/2015   Procedure: HYSTERECTOMY ABDOMINAL;  Surgeon: Ruffin Frederick, MD;  Location: [facility] ORS;  Service:  Gynecology;;  . Lorin [facility]    . KNEE ARTHROSCOPY Right   . KNEE ARTHROSCOPY WITH ANTERIOR CRUCIATE LIGAMENT (ACL) REPAIR WITH HAMSTRING GRAFT Right 02/14/2017   Procedure: KNEE ARTHROSCOPY WITH ANTERIOR CRUCIATE LIGAMENT (ACL) REPAIR WITH HAMSTRING GRAFT;  Surgeon: Thornton Park, MD;  Location: [facility] OR[facility];  Service: Orthopedics;  Laterality: Right;  . LAPAROSCOPIC SUPRACERVICAL HYSTERECTOMY N/A 05/29/2015   Procedure: LAPAROSCOPIC SUPRACERVICAL HYSTERECTOMY;  Surgeon: Ruffin Frederick, MD;  Location: [facility];  Service: Gynecology;  Laterality: N/A;  attempted  . TUBAL LIGATION     Outpatient Encounter Medications as of 05/31/2019  Medication Sig  . acetaminophen (TYLENOL) 500 MG tablet Take 1,000 mg by mouth every 8 (eight) hours as needed for mild pain or headache.   . cyclobenzaprine (FLEXERIL) 10 MG tablet Take 1 tablet (10 mg total) by mouth at bedtime. (Patient taking differently: Take 10 mg by mouth at bedtime as needed for muscle spasms. )  . hydrOXYzine (ATARAX/VISTARIL) 25 MG tablet Take 1 tablet (25 mg total) by mouth 3 (three) times daily as needed for anxiety.  . meloxicam (MOBIC) 15 MG tablet Take 1 tablet (15 mg total) by mouth daily.   No facility-administered encounter medications on file as of 05/31/2019.    No Known Allergies  Family History  Problem Relation Age of Onset  . Alcohol abuse Father   .  Arthritis Father   . Diabetes Father   . Drug abuse Father   . Cancer Father        pancreatic  . Diabetes Brother   . Cancer Mother        cervical   Social History   Socioeconomic History  . Marital status: Married    Spouse name: Not on file  . Number of children: Not on file  . Years of education: Not on file  . Highest education level: Not on file  Occupational History  . Not on file  Social Needs  . Financial resource strain: Not on file  . Food insecurity:    Worry: Not on file    Inability: Not on file  . Transportation needs:    Medical: Not  on file    Non-medical: Not on file  Tobacco Use  . Smoking status: Current Every Day Smoker    Packs/day: 1.00    Types: Cigarettes  . Smokeless tobacco: Never Used  Substance and Sexual Activity  . Alcohol use: Yes    Comment: socially  . Drug use: No  . Sexual activity: Yes  Lifestyle  . Physical activity:    Days per week: Not on file    Minutes per session: Not on file  . Stress: Not on file  Relationships  . Social connections:    Talks on phone: Not on file    Gets together: Not on file    Attends religious service: Not on file    Active member of club or organization: Not on file    Attends meetings of clubs or organizations: Not on file    Relationship status: Not on file  Other Topics Concern  . Not on file  Social History Narrative  . Not on file    Review of Systems  Constitutional: Negative.   Respiratory: Negative.   Cardiovascular: Negative.   Gastrointestinal: Negative.   Skin: Negative.   Neurological: Negative.   Psychiatric/Behavioral: Negative for agitation, behavioral problems, confusion, decreased concentration, dysphoric mood, hallucinations, self-injury, sleep disturbance and suicidal ideas. The patient is nervous/anxious. The patient is not hyperactive.     Per HPI unless specifically indicated above     Objective:    BP (!) 136/92   Pulse 92   Temp 98.3 F (36.8 C) (Oral)   Ht 5\' 4"  (1.626 m)   Wt 266 lb (120.7 kg)   LMP  (LMP Unknown)   SpO2 95%   BMI 45.66 kg/m   Wt Readings from Last 3 Encounters:  05/31/19 266 lb (120.7 kg)  03/11/19 271 lb 12.8 oz (123.3 kg)  02/27/19 256 lb (116.1 kg)    Physical Exam Vitals signs and nursing note reviewed.  Constitutional:      General: She is not in acute distress.    Appearance: Normal appearance. She is not ill-appearing, toxic-appearing or diaphoretic.  HENT:     Head: Normocephalic and atraumatic.     Right Ear: External ear normal.     Left Ear: External ear normal.     Nose:  Nose normal.     Mouth/Throat:     Mouth: Mucous membranes are moist.     Pharynx: Oropharynx is clear.  Eyes:     General: No scleral icterus.       Right eye: No discharge.        Left eye: No discharge.     Extraocular Movements: Extraocular movements intact.     Conjunctiva/sclera: Conjunctivae normal.  Pupils: Pupils are equal, round, and reactive to light.  Neck:     Musculoskeletal: Normal range of motion and neck supple.  Cardiovascular:     Rate and Rhythm: Normal rate and regular rhythm.     Pulses: Normal pulses.     Heart sounds: Normal heart sounds. No murmur. No friction rub. No gallop.   Pulmonary:     Effort: Pulmonary effort is normal. No respiratory distress.     Breath sounds: Normal breath sounds. No stridor. No wheezing, rhonchi or rales.  Chest:     Chest wall: No tenderness.  Musculoskeletal: Normal range of motion.  Skin:    General: Skin is warm and dry.     Capillary Refill: Capillary refill takes less than 2 seconds.     Coloration: Skin is not jaundiced or pale.     Findings: No bruising, erythema, lesion or rash.  Neurological:     General: No focal deficit present.     Mental Status: She is alert and oriented to person, place, and time. Mental status is at baseline.  Psychiatric:        Mood and Affect: Mood normal.        Behavior: Behavior normal.        Thought Content: Thought content normal.        Judgment: Judgment normal.     Results for orders placed or performed in visit on 03/11/19  Microscopic Examination  Result Value Ref Range   WBC, UA 0-5 0 - 5 /hpf   RBC, UA None seen 0 - 2 /hpf   Epithelial Cells (non renal) 0-10 0 - 10 /hpf   Mucus, UA Present Not Estab.   Bacteria, UA Moderate (A) None seen/Few  Urine Culture, Reflex  Result Value Ref Range   Urine Culture, Routine Final report    Organism ID, Bacteria Comment   Bayer DCA Hb A1c Waived  Result Value Ref Range   HB A1C (BAYER DCA - WAIVED) 5.8 <7.0 %  CBC with  Differential/Platelet  Result Value Ref Range   WBC 7.4 3.4 - 10.8 x10E3/uL   RBC 4.20 3.77 - 5.28 x10E6/uL   Hemoglobin 12.6 11.1 - 15.9 g/dL   Hematocrit 37.5 34.0 - 46.6 %   MCV 89 79 - 97 fL   MCH 30.0 26.6 - 33.0 pg   MCHC 33.6 31.5 - 35.7 g/dL   RDW 14.8 11.7 - 15.4 %   Platelets 194 150 - 450 x10E3/uL   Neutrophils 61 Not Estab. %   Lymphs 29 Not Estab. %   Monocytes 8 Not Estab. %   Eos 2 Not Estab. %   Basos 0 Not Estab. %   Neutrophils Absolute 4.5 1.4 - 7.0 x10E3/uL   Lymphocytes Absolute 2.1 0.7 - 3.1 x10E3/uL   Monocytes Absolute 0.6 0.1 - 0.9 x10E3/uL   EOS (ABSOLUTE) 0.2 0.0 - 0.4 x10E3/uL   Basophils Absolute 0.0 0.0 - 0.2 x10E3/uL   Immature Granulocytes 0 Not Estab. %   Immature Grans (Abs) 0.0 0.0 - 0.1 x10E3/uL  Comprehensive metabolic panel  Result Value Ref Range   Glucose 95 65 - 99 mg/dL   BUN 10 6 - 24 mg/dL   Creatinine, Ser 0.74 0.57 - 1.00 mg/dL   GFR calc non Af Amer 97 >59 mL/min/1.73   GFR calc Af Amer 112 >59 mL/min/1.73   BUN/Creatinine Ratio 14 9 - 23   Sodium 142 134 - 144 mmol/L   Potassium 3.7 3.5 - 5.2  mmol/L   Chloride 105 96 - 106 mmol/L   CO2 24 20 - 29 mmol/L   Calcium 8.5 (L) 8.7 - 10.2 mg/dL   Total Protein 6.0 6.0 - 8.5 g/dL   Albumin 3.8 3.8 - 4.8 g/dL   Globulin, Total 2.2 1.5 - 4.5 g/dL   Albumin/Globulin Ratio 1.7 1.2 - 2.2   Bilirubin Total <0.2 0.0 - 1.2 mg/dL   Alkaline Phosphatase 55 39 - 117 IU/L   AST 22 0 - 40 IU/L   ALT 18 0 - 32 IU/L  Lipid Panel w/o Chol/HDL Ratio  Result Value Ref Range   Cholesterol, Total 206 (H) 100 - 199 mg/dL   Triglycerides 147 0 - 149 mg/dL   HDL 49 >39 mg/dL   VLDL Cholesterol Cal 29 5 - 40 mg/dL   LDL Calculated 128 (H) 0 - 99 mg/dL  TSH  Result Value Ref Range   TSH 1.420 0.450 - 4.500 uIU/mL  UA/M w/rflx Culture, Routine  Result Value Ref Range   Specific Gravity, UA >1.030 (H) 1.005 - 1.030   pH, UA 6.0 5.0 - 7.5   Color, UA Yellow Yellow   Appearance Ur Hazy (A) Clear    Leukocytes, UA 1+ (A) Negative   Protein, UA Negative Negative/Trace   Glucose, UA Negative Negative   Ketones, UA Negative Negative   RBC, UA Negative Negative   Bilirubin, UA Negative Negative   Urobilinogen, Ur 0.2 0.2 - 1.0 mg/dL   Nitrite, UA Negative Negative   Microscopic Examination See below:    Urinalysis Reflex Comment   Iron and TIBC  Result Value Ref Range   Total Iron Binding Capacity 368 250 - 450 ug/dL   UIBC 316 131 - 425 ug/dL   Iron 52 27 - 159 ug/dL   Iron Saturation 14 (L) 15 - 55 %  Ferritin  Result Value Ref Range   Ferritin 48 15 - 150 ng/mL  Microalbumin, Urine Waived  Result Value Ref Range   Microalb, Ur Waived 30 (H) 0 - 19 mg/L   Creatinine, Urine Waived 300 10 - 300 mg/dL   Microalb/Creat Ratio <30 <30 mg/g      Assessment & Plan:   Problem List Items Addressed This Visit    None    Visit Diagnoses    Pre-operative clearance    -  Primary   EKG normal. Will check labs. Assuming normal labs- should be cleared. Await results. Call with any concerns.    Relevant Orders   CBC with Differential/Platelet (Completed)   Comprehensive metabolic panel   CoaguChek XS/INR Waived   EKG 12-Lead       Follow up plan: Return if symptoms worsen or fail to improve.  ADDENDUM 06/02/19 8:43PM- labs normal. Cleared for surgery. Will send note to podiatry. Call with any concerns.

## 2019-06-01 LAB — CBC WITH DIFFERENTIAL/PLATELET
Basophils Absolute: 0 10*3/uL (ref 0.0–0.2)
Basos: 1 %
EOS (ABSOLUTE): 0.1 10*3/uL (ref 0.0–0.4)
Eos: 2 %
Hematocrit: 40.5 % (ref 34.0–46.6)
Hemoglobin: 13.6 g/dL (ref 11.1–15.9)
Immature Grans (Abs): 0 10*3/uL (ref 0.0–0.1)
Immature Granulocytes: 0 %
Lymphocytes Absolute: 2.4 10*3/uL (ref 0.7–3.1)
Lymphs: 33 %
MCH: 29.3 pg (ref 26.6–33.0)
MCHC: 33.6 g/dL (ref 31.5–35.7)
MCV: 87 fL (ref 79–97)
Monocytes Absolute: 0.5 10*3/uL (ref 0.1–0.9)
Monocytes: 7 %
Neutrophils Absolute: 4.1 10*3/uL (ref 1.4–7.0)
Neutrophils: 57 %
Platelets: 197 10*3/uL (ref 150–450)
RBC: 4.64 x10E6/uL (ref 3.77–5.28)
RDW: 13.2 % (ref 11.7–15.4)
WBC: 7.2 10*3/uL (ref 3.4–10.8)

## 2019-06-01 LAB — COMPREHENSIVE METABOLIC PANEL
ALT: 18 IU/L (ref 0–32)
AST: 19 IU/L (ref 0–40)
Albumin/Globulin Ratio: 1.4 (ref 1.2–2.2)
Albumin: 4.2 g/dL (ref 3.8–4.8)
Alkaline Phosphatase: 61 IU/L (ref 39–117)
BUN/Creatinine Ratio: 13 (ref 9–23)
BUN: 10 mg/dL (ref 6–24)
Bilirubin Total: <0.2 mg/dL (ref 0.0–1.2)
CO2: 22 mmol/L (ref 20–29)
Calcium: 9.3 mg/dL (ref 8.7–10.2)
Chloride: 106 mmol/L (ref 96–106)
Creatinine, Ser: 0.79 mg/dL (ref 0.57–1.00)
GFR calc Af Amer: 104 mL/min/{1.73_m2} (ref >59)
GFR calc non Af Amer: 90 mL/min/{1.73_m2} (ref >59)
Globulin, Total: 2.9 g/dL (ref 1.5–4.5)
Glucose: 102 mg/dL — ABNORMAL HIGH (ref 65–99)
Potassium: 3.5 mmol/L (ref 3.5–5.2)
Sodium: 146 mmol/L — ABNORMAL HIGH (ref 134–144)
Total Protein: 7.1 g/dL (ref 6.0–8.5)

## 2019-06-01 LAB — COAGUCHEK XS/INR WAIVED
INR: 1.1 (ref 0.9–1.1)
Prothrombin Time: 12.8 s

## 2019-06-04 ENCOUNTER — Other Ambulatory Visit: Payer: Self-pay

## 2019-06-04 ENCOUNTER — Encounter
Admission: RE | Admit: 2019-06-04 | Discharge: 2019-06-04 | Disposition: A | Discharge status: Home / Self Care | Payer: BC Managed Care – PPO | Source: Ambulatory Visit | Attending: Podiatry | Admitting: Podiatry

## 2019-06-04 ENCOUNTER — Telehealth: Payer: Self-pay | Admitting: Family Medicine

## 2019-06-04 DIAGNOSIS — Z1159 Encounter for screening for other viral diseases: Secondary | ICD-10-CM | POA: Insufficient documentation

## 2019-06-04 DIAGNOSIS — Z01818 Encounter for other preprocedural examination: Secondary | ICD-10-CM | POA: Diagnosis not present

## 2019-06-04 HISTORY — DX: Anxiety disorder, unspecified: F41.9

## 2019-06-04 LAB — SARS CORONAVIRUS 2 BY RT PCR (HOSPITAL ORDER, PERFORMED IN ~~LOC~~ HOSPITAL LAB): SARS Coronavirus 2: NEGATIVE

## 2019-06-04 NOTE — Telephone Encounter (Signed)
Faxed to [facility], also called and made them aware of where to find EKG.

## 2019-06-04 NOTE — Telephone Encounter (Signed)
I also cannot see EKG. Can see order and result note but unable to find actual EKG tracing. Please see if you can see/find it.

## 2019-06-04 NOTE — Patient Instructions (Signed)
Your procedure is scheduled on: Friday 06/07/19 Report to [place]. To find out your arrival time please call [phone] between 1PM - 3PM on Thursday 06/06/19.  Remember: Instructions that are not followed completely may result in serious medical risk, up to and including death, or upon the discretion of your surgeon and anesthesiologist your surgery may need to be rescheduled.     _X__ 1. Do not eat food after midnight the night before your procedure.                 No gum chewing or hard candies. You may drink clear liquids up to 2 hours                 before you are scheduled to arrive for your surgery- DO not drink clear                 liquids within 2 hours of the start of your surgery.                 Clear Liquids include:  Water, G2 Gatorade 2hours prior to arrival                   __X__2.  On the morning of surgery brush your teeth with toothpaste and water, you                 may rinse your mouth with mouthwash if you wish.  Do not swallow any              toothpaste of mouthwash.     _X__ 3.  No Alcohol for 24 hours before or after surgery.   _X__ 4.  Do Not Smoke or use e-cigarettes For 24 Hours Prior to Your Surgery.                 Do not use any chewable tobacco products for at least 6 hours prior to                 surgery.  ____  5.  Bring all medications with you on the day of surgery if instructed.   __X__  6.  Notify your doctor if there is any change in your medical condition      (cold, fever, infections).     Do not wear jewelry, make-up, hairpins, clips or nail polish. Do not wear lotions, powders, or perfumes.  Do not shave 48 hours prior to surgery. Men may shave face and neck. Do not bring valuables to the hospital.    [facility] is not responsible for any belongings or valuables.  Contacts, dentures/partials or body piercings may not be worn into surgery. Bring a case for your contacts,  glasses or hearing aids, a denture cup will be supplied. Leave your suitcase in the car. After surgery it may be brought to your room. For patients admitted to the hospital, discharge time is determined by your treatment team.   Patients discharged the day of surgery will not be allowed to drive home.   Please read over the following fact sheets that you were given:   MRSA Information  __X__ Take these medicines the morning of surgery with A SIP OF WATER:    1. none  2.   3.   4.  5.  6.  ____ Fleet Enema (as directed)   __X__ Use CHG Soap/SAGE wipes as directed  ____ Use inhalers  on the day of surgery  ____ Stop metformin/Janumet/Farxiga 2 days prior to surgery    ____ Take 1/2 of usual insulin dose the night before surgery. No insulin the morning          of surgery.   ____ Stop Blood Thinners Coumadin/Plavix/Xarelto/Pleta/Pradaxa/Eliquis/Effient/Aspirin  on   Or contact your Surgeon, Cardiologist or Medical Doctor regarding  ability to stop your blood thinners  __X__ Stop Anti-inflammatories 7 days before surgery such as Advil, Ibuprofen, Motrin,  BC or Goodies Powder, Naprosyn, Naproxen, Aleve, Aspirin   STOP MELOXICAM TODAY  __X__ Stop all herbal supplements, fish oil or vitamin E until after surgery.    ____ Bring C-Pap to the hospital.

## 2019-06-04 NOTE — Telephone Encounter (Signed)
Haynes Dage it's under the CBC from 6/5. I've printed it for [place] to fax.

## 2019-06-04 NOTE — Telephone Encounter (Signed)
Copied from [place] [id number]. Topic: General - Other >> Jun 04, 2019  9:10 AM Oneta Rack wrote: Caller name: Maye Hides  Relation to pt: RN from [place]  Call back number: [phone] fax # [phone]    Reason for call:  Requesting most recent EKG date of service 05/31/2019 (nurse unable to see in [place]) please fax

## 2019-06-04 NOTE — Telephone Encounter (Signed)
Please print and fax most recent EKG

## 2019-06-06 MED ORDER — CEFAZOLIN SODIUM-DEXTROSE 2-4 GM/100ML-% IV SOLN: 2.0000 g | INTRAVENOUS | Status: DC

## 2019-06-07 ENCOUNTER — Encounter: Payer: Self-pay | Admitting: Anesthesiology

## 2019-06-07 ENCOUNTER — Encounter
Admission: RE | Disposition: A | Discharge status: Home / Self Care | Payer: Self-pay | Source: Home / Self Care | Attending: Podiatry

## 2019-06-07 ENCOUNTER — Ambulatory Visit: Payer: BC Managed Care – PPO | Admitting: Anesthesiology

## 2019-06-07 ENCOUNTER — Ambulatory Visit
Admission: RE | Admit: 2019-06-07 | Discharge: 2019-06-07 | Disposition: A | Discharge status: Home / Self Care | Payer: BC Managed Care – PPO | Attending: Podiatry | Admitting: Podiatry

## 2019-06-07 DIAGNOSIS — E119 Type 2 diabetes mellitus without complications: Secondary | ICD-10-CM | POA: Insufficient documentation

## 2019-06-07 DIAGNOSIS — Z90710 Acquired absence of both cervix and uterus: Secondary | ICD-10-CM | POA: Diagnosis not present

## 2019-06-07 DIAGNOSIS — Z79899 Other long term (current) drug therapy: Secondary | ICD-10-CM | POA: Insufficient documentation

## 2019-06-07 DIAGNOSIS — M7662 Achilles tendinitis, left leg: Secondary | ICD-10-CM | POA: Insufficient documentation

## 2019-06-07 DIAGNOSIS — M9262 Juvenile osteochondrosis of tarsus, left ankle: Secondary | ICD-10-CM | POA: Insufficient documentation

## 2019-06-07 DIAGNOSIS — Z9049 Acquired absence of other specified parts of digestive tract: Secondary | ICD-10-CM | POA: Insufficient documentation

## 2019-06-07 DIAGNOSIS — Z833 Family history of diabetes mellitus: Secondary | ICD-10-CM | POA: Insufficient documentation

## 2019-06-07 DIAGNOSIS — M7661 Achilles tendinitis, right leg: Secondary | ICD-10-CM | POA: Diagnosis not present

## 2019-06-07 DIAGNOSIS — F17200 Nicotine dependence, unspecified, uncomplicated: Secondary | ICD-10-CM | POA: Diagnosis not present

## 2019-06-07 DIAGNOSIS — M7732 Calcaneal spur, left foot: Secondary | ICD-10-CM | POA: Diagnosis not present

## 2019-06-07 HISTORY — PX: ACHILLES TENDON SURGERY: SHX542

## 2019-06-07 HISTORY — PX: OSTECTOMY: SHX6439

## 2019-06-07 LAB — GLUCOSE, CAPILLARY
Glucose-Capillary: 116 mg/dL — ABNORMAL HIGH (ref 70–99)
Glucose-Capillary: 146 mg/dL — ABNORMAL HIGH (ref 70–99)

## 2019-06-07 SURGERY — REPAIR, TENDON, ACHILLES
Anesthesia: Choice | Laterality: Left

## 2019-06-07 MED ORDER — DEXMEDETOMIDINE HCL 200 MCG/2ML IV SOLN
INTRAVENOUS | Status: DC | PRN
  Administered 2019-06-07: 10 ug via INTRAVENOUS
  Administered 2019-06-07 (×2): 12 ug via INTRAVENOUS

## 2019-06-07 MED ORDER — ONDANSETRON HCL 4 MG PO TABS: 4.0000 mg | ORAL_TABLET | Freq: Four times a day (QID) | ORAL | Status: DC | PRN

## 2019-06-07 MED ORDER — FAMOTIDINE 20 MG PO TABS
ORAL_TABLET | ORAL | Status: AC
  Administered 2019-06-07: 08:00:00 20 mg via ORAL
  Filled 2019-06-07: qty 1

## 2019-06-07 MED ORDER — PROPOFOL 10 MG/ML IV BOLUS
INTRAVENOUS | Status: DC | PRN
  Administered 2019-06-07: 30 mg via INTRAVENOUS
  Administered 2019-06-07: 50 mg via INTRAVENOUS
  Administered 2019-06-07: 150 mg via INTRAVENOUS

## 2019-06-07 MED ORDER — FENTANYL CITRATE (PF) 100 MCG/2ML IJ SOLN
INTRAMUSCULAR | Status: DC | PRN
  Administered 2019-06-07 (×3): 50 ug via INTRAVENOUS
  Administered 2019-06-07: 100 ug via INTRAVENOUS

## 2019-06-07 MED ORDER — DEXAMETHASONE SODIUM PHOSPHATE 10 MG/ML IJ SOLN
INTRAMUSCULAR | Status: DC | PRN
  Administered 2019-06-07: 10 mg via INTRAVENOUS

## 2019-06-07 MED ORDER — OXYCODONE-ACETAMINOPHEN 5-325 MG PO TABS: 1.0000 | ORAL_TABLET | Freq: Four times a day (QID) | ORAL | 0 refills | Status: DC | PRN

## 2019-06-07 MED ORDER — LIDOCAINE HCL (CARDIAC) PF 100 MG/5ML IV SOSY
PREFILLED_SYRINGE | INTRAVENOUS | Status: DC | PRN
  Administered 2019-06-07: 80 mg via INTRAVENOUS

## 2019-06-07 MED ORDER — ROCURONIUM BROMIDE 50 MG/5ML IV SOLN
INTRAVENOUS | Status: AC
  Filled 2019-06-07: qty 1

## 2019-06-07 MED ORDER — ONDANSETRON HCL 4 MG/2ML IJ SOLN
INTRAMUSCULAR | Status: DC | PRN
  Administered 2019-06-07: 4 mg via INTRAVENOUS

## 2019-06-07 MED ORDER — POVIDONE-IODINE 7.5 % EX SOLN
Freq: Once | CUTANEOUS | Status: DC
  Filled 2019-06-07: qty 118

## 2019-06-07 MED ORDER — ONDANSETRON HCL 4 MG/2ML IJ SOLN: 4.0000 mg | Freq: Four times a day (QID) | INTRAMUSCULAR | Status: DC | PRN

## 2019-06-07 MED ORDER — BUPIVACAINE LIPOSOME 1.3 % IJ SUSP
INTRAMUSCULAR | Status: AC
  Filled 2019-06-07: qty 20

## 2019-06-07 MED ORDER — LACTATED RINGERS IV SOLN
INTRAVENOUS | Status: DC | PRN
  Administered 2019-06-07: 10:00:00 via INTRAVENOUS

## 2019-06-07 MED ORDER — ROCURONIUM BROMIDE 100 MG/10ML IV SOLN
INTRAVENOUS | Status: DC | PRN
  Administered 2019-06-07: 40 mg via INTRAVENOUS

## 2019-06-07 MED ORDER — SUCCINYLCHOLINE CHLORIDE 20 MG/ML IJ SOLN
INTRAMUSCULAR | Status: AC
  Filled 2019-06-07: qty 1

## 2019-06-07 MED ORDER — ACETAMINOPHEN 10 MG/ML IV SOLN
INTRAVENOUS | Status: AC
  Filled 2019-06-07: qty 100

## 2019-06-07 MED ORDER — PHENYLEPHRINE HCL (PRESSORS) 10 MG/ML IV SOLN
INTRAVENOUS | Status: DC | PRN
  Administered 2019-06-07: 100 ug via INTRAVENOUS

## 2019-06-07 MED ORDER — ACETAMINOPHEN 10 MG/ML IV SOLN
INTRAVENOUS | Status: DC | PRN
  Administered 2019-06-07: 1000 mg via INTRAVENOUS

## 2019-06-07 MED ORDER — BUPIVACAINE LIPOSOME 1.3 % IJ SUSP
INTRAMUSCULAR | Status: DC | PRN
  Administered 2019-06-07 (×2): 10 mL

## 2019-06-07 MED ORDER — MIDAZOLAM HCL 2 MG/2ML IJ SOLN
INTRAMUSCULAR | Status: AC
  Filled 2019-06-07: qty 2

## 2019-06-07 MED ORDER — SUGAMMADEX SODIUM 200 MG/2ML IV SOLN
INTRAVENOUS | Status: DC | PRN
  Administered 2019-06-07: 200 mg via INTRAVENOUS
  Administered 2019-06-07: 50 mg via INTRAVENOUS

## 2019-06-07 MED ORDER — BUPIVACAINE HCL (PF) 0.25 % IJ SOLN
INTRAMUSCULAR | Status: AC
  Filled 2019-06-07: qty 30

## 2019-06-07 MED ORDER — FENTANYL CITRATE (PF) 100 MCG/2ML IJ SOLN: 25.0000 ug | INTRAMUSCULAR | Status: DC | PRN

## 2019-06-07 MED ORDER — KETOROLAC TROMETHAMINE 30 MG/ML IJ SOLN
INTRAMUSCULAR | Status: DC | PRN
  Administered 2019-06-07: 15 mg via INTRAVENOUS

## 2019-06-07 MED ORDER — FENTANYL CITRATE (PF) 250 MCG/5ML IJ SOLN
INTRAMUSCULAR | Status: AC
  Filled 2019-06-07: qty 5

## 2019-06-07 MED ORDER — SODIUM CHLORIDE 0.9 % IV SOLN
INTRAVENOUS | Status: DC
  Administered 2019-06-07: 08:00:00 via INTRAVENOUS

## 2019-06-07 MED ORDER — PROPOFOL 10 MG/ML IV BOLUS
INTRAVENOUS | Status: AC
  Filled 2019-06-07: qty 20

## 2019-06-07 MED ORDER — ONDANSETRON HCL 4 MG/2ML IJ SOLN: 4.0000 mg | Freq: Once | INTRAMUSCULAR | Status: DC | PRN

## 2019-06-07 MED ORDER — BUPIVACAINE-EPINEPHRINE (PF) 0.25% -1:200000 IJ SOLN
INTRAMUSCULAR | Status: DC | PRN
  Administered 2019-06-07: 10 mL

## 2019-06-07 MED ORDER — FAMOTIDINE 20 MG PO TABS
20.0000 mg | ORAL_TABLET | Freq: Once | ORAL | Status: AC
  Administered 2019-06-07: 20 mg via ORAL

## 2019-06-07 MED ORDER — ONDANSETRON HCL 4 MG/2ML IJ SOLN
INTRAMUSCULAR | Status: AC
  Filled 2019-06-07: qty 2

## 2019-06-07 MED ORDER — MIDAZOLAM HCL 2 MG/2ML IJ SOLN
INTRAMUSCULAR | Status: DC | PRN
  Administered 2019-06-07: 2 mg via INTRAVENOUS

## 2019-06-07 MED ORDER — LIDOCAINE HCL (PF) 2 % IJ SOLN
INTRAMUSCULAR | Status: AC
  Filled 2019-06-07: qty 10

## 2019-06-07 MED ORDER — CEFAZOLIN SODIUM-DEXTROSE 2-4 GM/100ML-% IV SOLN
INTRAVENOUS | Status: AC
  Filled 2019-06-07: qty 100

## 2019-06-07 SURGICAL SUPPLY — 75 items
"PENCIL ELECTRO HAND CTR " (MISCELLANEOUS) ×1 IMPLANT
ANCHOR 4.5 FOOTPRINT ULTRA (Anchor) ×1 IMPLANT
BANDAGE ELASTIC 4 LF NS (GAUZE/BANDAGES/DRESSINGS) ×4 IMPLANT
BIT DRILL 4X4.5 FOOTPRINT STR (BIT) IMPLANT
BLADE SURG 15 STRL LF DISP TIS (BLADE) ×2 IMPLANT
BLADE SURG 15 STRL SS (BLADE) ×3
BLADE SURG MINI STRL (BLADE) ×1 IMPLANT
BNDG COHESIVE 4X5 TAN STRL (GAUZE/BANDAGES/DRESSINGS) ×2 IMPLANT
BNDG CONFORM 2 STRL LF (GAUZE/BANDAGES/DRESSINGS) ×2 IMPLANT
BNDG CONFORM 3 STRL LF (GAUZE/BANDAGES/DRESSINGS) ×2 IMPLANT
BNDG ESMARK 4X12 TAN STRL LF (GAUZE/BANDAGES/DRESSINGS) ×1 IMPLANT
BNDG ESMARK 6X12 TAN STRL LF (GAUZE/BANDAGES/DRESSINGS) ×1 IMPLANT
BNDG GAUZE 4.5X4.1 6PLY STRL (MISCELLANEOUS) ×2 IMPLANT
CANISTER SUCT 1200ML W/VALVE (MISCELLANEOUS) ×2 IMPLANT
COVER WAND RF STERILE (DRAPES) ×2 IMPLANT
CUFF TOURN SGL QUICK 18X4 (TOURNIQUET CUFF) ×1 IMPLANT
CUFF TOURN SGL QUICK 34 (TOURNIQUET CUFF) ×1
CUFF TRNQT CYL 34X4.125X (TOURNIQUET CUFF) IMPLANT
DRAPE C-ARM XRAY 36X54 (DRAPES) ×2 IMPLANT
DRAPE FLUOR MINI C-ARM 54X84 (DRAPES) ×2 IMPLANT
DRILL 4X4.5 FOOTPRINT STR (BIT) ×2
DURAPREP 26ML APPLICATOR (WOUND CARE) ×2 IMPLANT
ELECT REM PT RETURN 9FT ADLT (ELECTROSURGICAL) ×2
ELECTRODE REM PT RTRN 9FT ADLT (ELECTROSURGICAL) ×1 IMPLANT
GAUZE SPONGE 4X4 12PLY STRL (GAUZE/BANDAGES/DRESSINGS) ×2 IMPLANT
GAUZE XEROFORM 1X8 LF (GAUZE/BANDAGES/DRESSINGS) ×2 IMPLANT
GLOVE BIO SURGEON STRL SZ7.5 (GLOVE) ×2 IMPLANT
GLOVE INDICATOR 8.0 STRL GRN (GLOVE) ×2 IMPLANT
GOWN STRL REUS W/ TWL LRG LVL3 (GOWN DISPOSABLE) ×2 IMPLANT
GOWN STRL REUS W/TWL LRG LVL3 (GOWN DISPOSABLE) ×2
HANDLE YANKAUER SUCT BULB TIP (MISCELLANEOUS) ×2 IMPLANT
IV NS 500ML (IV SOLUTION) ×1
IV NS 500ML BAXH (IV SOLUTION) IMPLANT
KIT TURNOVER KIT A (KITS) ×2 IMPLANT
LABEL OR SOLS (LABEL) ×2 IMPLANT
NDL FILTER BLUNT 18X1 1/2 (NEEDLE) ×1 IMPLANT
NDL HYPO 25X1 1.5 SAFETY (NEEDLE) ×3 IMPLANT
NDL MAYO CATGUT SZ5 (NEEDLE) ×1
NDL SUT 5 .5 CRC TPR PNT MAYO (NEEDLE) ×1 IMPLANT
NEEDLE FILTER BLUNT 18X 1/2SAF (NEEDLE) ×1
NEEDLE FILTER BLUNT 18X1 1/2 (NEEDLE) ×1 IMPLANT
NEEDLE HYPO 25X1 1.5 SAFETY (NEEDLE) ×6 IMPLANT
NS IRRIG 500ML POUR BTL (IV SOLUTION) ×3 IMPLANT
PACK EXTREMITY ARMC (MISCELLANEOUS) ×2 IMPLANT
PAD CAST CTTN 4X4 STRL (SOFTGOODS) ×1 IMPLANT
PAD PREP 24X41 OB/GYN DISP (PERSONAL CARE ITEMS) ×1 IMPLANT
PADDING CAST COTTON 4X4 STRL (SOFTGOODS) ×1
PENCIL ELECTRO HAND CTR (MISCELLANEOUS) ×2 IMPLANT
RASP SM TEAR CROSS CUT (RASP) ×2 IMPLANT
SPLINT CAST 1 STEP 4X30 (MISCELLANEOUS) ×2 IMPLANT
SPLINT CAST 1 STEP 5X30 WHT (MISCELLANEOUS) ×2 IMPLANT
SPLINT FAST PLASTER 5X30 (CAST SUPPLIES) ×1
SPLINT PLASTER CAST FAST 5X30 (CAST SUPPLIES) ×1 IMPLANT
SPONGE LAP 18X18 RF (DISPOSABLE) ×2 IMPLANT
STAPLER SKIN PROX 35W (STAPLE) ×2 IMPLANT
STOCKINETTE M/LG 89821 (MISCELLANEOUS) ×2 IMPLANT
STRIP CLOSURE SKIN 1/2X4 (GAUZE/BANDAGES/DRESSINGS) ×2 IMPLANT
SUT MNCRL+ 5-0 VIOLET P-3 (SUTURE) ×1 IMPLANT
SUT MONOCRYL 5-0 (SUTURE) ×2
SUT PDS AB 0 CT1 27 (SUTURE) IMPLANT
SUT VIC AB 0 SH 27 (SUTURE) ×1 IMPLANT
SUT VIC AB 2-0 CT1 27 (SUTURE)
SUT VIC AB 2-0 CT1 TAPERPNT 27 (SUTURE) ×1 IMPLANT
SUT VIC AB 2-0 SH 27 (SUTURE)
SUT VIC AB 2-0 SH 27XBRD (SUTURE) ×2 IMPLANT
SUT VIC AB 3-0 SH 27 (SUTURE)
SUT VIC AB 3-0 SH 27X BRD (SUTURE) ×1 IMPLANT
SUT VIC AB 4-0 FS2 27 (SUTURE) ×2 IMPLANT
SUT VICRYL AB 3-0 FS1 BRD 27IN (SUTURE) ×2 IMPLANT
SWABSTK COMLB BENZOIN TINCTURE (MISCELLANEOUS) ×2 IMPLANT
SYR 10ML LL (SYRINGE) ×4 IMPLANT
SYR 3ML LL SCALE MARK (SYRINGE) ×2 IMPLANT
WAND TOPAZ MICRO DEBRIDER (MISCELLANEOUS) ×1 IMPLANT
WIRE MAGNUM (SUTURE) ×2 IMPLANT
WIRE Z .062 C-WIRE SPADE TIP (WIRE) ×1 IMPLANT

## 2019-06-07 NOTE — H&P (Signed)
HISTORY AND PHYSICAL INTERVAL NOTE:  06/07/2019  8:53 AM  Amanda Moran  has presented today for surgery, with the diagnosis of M76.61, M76.62 ACHILLES TENDON BOTH LOWER EXTREMITIES M92.60 HAGLUNGS DEFORMITY.  The various methods of treatment have been discussed with the patient.  No guarantees were given.  After consideration of risks, benefits and other options for treatment, the patient has consented to surgery.  I have reviewed the patients' chart and labs.     A history and physical examination was performed in my office.  The patient was reexamined.  There have been no changes to this history and physical examination.  Amanda Moran A

## 2019-06-07 NOTE — Anesthesia Post-op Follow-up Note (Signed)
Anesthesia QCDR form completed.        

## 2019-06-07 NOTE — Discharge Instructions (Addendum)
[place] [address] [place]  (NON WEIGHT BEARING STATUS USING WALKER OR CRUTCHES PER DR. Vickki Muff.)   1. Take your medication as prescribed.  Pain medication should be taken only as needed.  2. Keep the dressing clean, dry and intact.  3. Keep your foot elevated above the heart level for the first 48 hours.  4. Walking to the bathroom and brief periods of walking are acceptable, unless we have instructed you to be non-weight bearing.  5. Always wear your post-op shoe when walking.  Always use your crutches if you are to be non-weight bearing.  6. Do not take a shower. Baths are permissible as long as the foot is kept out of the water.   7. Every hour you are awake:  - Bend your knee 15 times.   8. Call [facility] [phone]) if any of the following problems occur: - You develop a temperature or fever. - The bandage becomes saturated with blood. - Medication does not stop your pain. - Injury of the foot occurs. - Any symptoms of infection including redness, odor, or red streaks running from wound.    AMBULATORY SURGERY  DISCHARGE INSTRUCTIONS   1) The drugs that you were given will stay in your system until tomorrow so for the next 24 hours you should not:  A) Drive an automobile B) Make any legal decisions C) Drink any alcoholic beverage   2) You may resume regular meals tomorrow.  Today it is better to start with liquids and gradually work up to solid foods.  You may eat anything you prefer, but it is better to start with liquids, then soup and crackers, and gradually work up to solid foods.   3) Please notify your doctor immediately if you have any unusual bleeding, trouble breathing, redness and pain at the surgery site, drainage, fever, or pain not relieved by medication.    4) Additional Instructions:        Please contact  your physician with any problems or Same Day Surgery at [phone], Monday through Friday 6 am to 4 pm, or  at [facility] number at [phone].

## 2019-06-07 NOTE — Transfer of Care (Signed)
Immediate Anesthesia Transfer of Care Note  Patient: Amanda Moran  Procedure(s) Performed: ACHILLES REPAIR SECONDARY LEFT (Left ) HAGLUNDS/RETROCALCANEAL OSTECTOMY (Left )  Patient Location: PACU  Anesthesia Type:General  Level of Consciousness: sedated and responds to stimulation  Airway & Oxygen Therapy: Patient Spontanous Breathing and Patient connected to face mask oxygen  Post-op Assessment: Report given to RN and Post -op Vital signs reviewed and stable  Post vital signs: Reviewed and stable  Last Vitals:  Vitals Value Taken Time  BP 119/87 06/07/19 1208  Temp 35.8 C 06/07/19 1208  Pulse 89 06/07/19 1208  Resp 22 06/07/19 1208  SpO2 94 % 06/07/19 1208    Last Pain:  Vitals:   06/07/19 1208  TempSrc:   PainSc: Asleep         Complications: No apparent anesthesia complications

## 2019-06-07 NOTE — Anesthesia Procedure Notes (Signed)
Procedure Name: Intubation Date/Time: 06/07/2019 9:49 AM Performed by: Justus Memory, CRNA Pre-anesthesia Checklist: Patient identified, Patient being monitored, Timeout performed, Emergency Drugs available and Suction available Patient Re-evaluated:Patient Re-evaluated prior to induction Oxygen Delivery Method: Circle system utilized Preoxygenation: Pre-oxygenation with 100% oxygen Induction Type: IV induction Ventilation: Mask ventilation without difficulty Laryngoscope Size: Mac and 3 Grade View: Grade I Tube type: Oral Tube size: 7.0 mm Number of attempts: 1 Airway Equipment and Method: Stylet Placement Confirmation: ETT inserted through vocal cords under direct vision,  positive ETCO2 and breath sounds checked- equal and bilateral Secured at: 21 cm Tube secured with: Tape Dental Injury: Teeth and Oropharynx as per pre-operative assessment

## 2019-06-07 NOTE — Op Note (Signed)
Operative note   Surgeon:Ashlynn Gunnels Lawyer: None    Preop diagnosis: 1.  Left Achilles insertional tendinitis 2.  Left calcaneal exostosis    Postop diagnosis: Same    Procedure: 1.  Debridement with secondary repair left Achilles tendinitis 2.  Calcaneal exostectomy left heel    EBL: 10 mL's    Anesthesia:local and general.  Local consisted of a total of 20 mils of Exparel long-acting anesthetic and 10 mL's of 0.25% bupivacaine with epinephrine infiltrated along the incision site    Hemostasis: Epinephrine along the incision site    Specimen: Bone spur and tendinitis left heel    Complications: None    Operative indications:Amanda Moran is an 48 y.o. that presents today for surgical intervention.  The risks/benefits/alternatives/complications have been discussed and consent has been given.    Procedure:  Patient was brought into the OR and placed on the operating table in theprone position. After anesthesia was obtained theleft lower extremity was prepped and draped in usual sterile fashion.  Attention was directed to the posterior aspect of the Achilles at its insertional site where longitudinal incision was performed.  Sharp and blunt dissection carried down to the peritenon.  The peritenon was then incised.  This exposed the posterior Achilles insertional site.  Prominent and thickening of the tendon was noted.  A longitudinal incision was performed in the tendon splitting incision was taken down to the calcaneus.  A moderate to moderately severe posterior calcaneal exostosis was noted.  With a combination of osteotome and power rasp all of the posterior insertional Achilles bone spur was removed.  The posterior superior bony prominence as seen on x-ray was also smoothed down to an anatomic level.  The wound was flushed with copious amounts of irrigation.  Next attention was directed to the Achilles tendon at its insertion where a moderate amount of insertional tendinitis  and palpable fibrotic and fiber density material was noted.  This was then excised sharply.  At this time the #2 Magnum wire was then used and placed into the Achilles tendon with a short Krakw suture.  A 5.0 mm footprint bone anchor (Arthrocare) was placed into the posterior calcaneus and the tendon was fashioned against the bone anchor with tension.  The foot was held in neutral positioning.  The wound was flushed with copious amounts of irrigation.  Remainder of the Achilles splitting site was then sutured back with a 4-0 Vicryl.  The peritenon was closed with a 4-0 Vicryl in the subcutaneous tissue closed with a 4-0 Vicryl.  The skin was closed with a 5-0 Monocryl.  Prior to final closure the Achilles tendon was infiltrated with a Topaz wand.  At closure of the surgical site was infiltrated with Exparel long-acting anesthetic.  A bulky sterile dressing was applied and patient was placed in a equalizer walker boot with the foot in neutral position..    Patient tolerated the procedure and anesthesia well.  Was transported from the OR to the PACU with all vital signs stable and vascular status intact. To be discharged per routine protocol.  Will follow up in approximately 1 week in the outpatient clinic.

## 2019-06-07 NOTE — Anesthesia Postprocedure Evaluation (Signed)
Anesthesia Post Note  Patient: Amanda Moran  Procedure(s) Performed: ACHILLES REPAIR SECONDARY LEFT (Left ) HAGLUNDS/RETROCALCANEAL OSTECTOMY (Left )  Patient location during evaluation: PACU Anesthesia Type: General Level of consciousness: awake and alert Pain management: pain level controlled Vital Signs Assessment: post-procedure vital signs reviewed and stable Respiratory status: spontaneous breathing, nonlabored ventilation, respiratory function stable and patient connected to nasal cannula oxygen Cardiovascular status: stable and blood pressure returned to baseline Postop Assessment: no apparent nausea or vomiting Anesthetic complications: no     Last Vitals:  Vitals:   06/07/19 1208 06/07/19 1223  BP: 119/87 (!) 169/109  Pulse: 89 88  Resp: (!) 22 19  Temp: (!) 35.8 C   SpO2: 94% 100%    Last Pain:  Vitals:   06/07/19 1208  TempSrc:   PainSc: [place]

## 2019-06-10 ENCOUNTER — Encounter: Payer: Self-pay | Admitting: Podiatry

## 2019-06-10 LAB — SURGICAL PATHOLOGY

## 2019-06-12 DIAGNOSIS — M9262 Juvenile osteochondrosis of tarsus, left ankle: Secondary | ICD-10-CM | POA: Diagnosis not present

## 2019-06-13 NOTE — Anesthesia Preprocedure Evaluation (Signed)
Anesthesia Evaluation  Patient identified by MRN, date of birth, ID band Patient awake    Reviewed: Allergy & Precautions, H&P , NPO status , Patient's Chart, lab work & pertinent test results, reviewed documented beta blocker date and time   Airway Mallampati: II  TM Distance: >3 FB Neck ROM: full    Dental  (+) Teeth Intact   Pulmonary neg pulmonary ROS, Current Smoker,    Pulmonary exam normal        Cardiovascular Exercise Tolerance: Good negative cardio ROS Normal cardiovascular exam Rate:Normal     Neuro/Psych  Headaches, negative psych ROS   GI/Hepatic negative GI ROS, Neg liver ROS,   Endo/Other  diabetesMorbid obesity  Renal/GU negative Renal ROS  negative genitourinary   Musculoskeletal   Abdominal   Peds  Hematology negative hematology ROS (+)   Anesthesia Other Findings   Reproductive/Obstetrics negative OB ROS                             Anesthesia Physical Anesthesia Plan  ASA: III  Anesthesia Plan: General LMA   Post-op Pain Management:    Induction:   PONV Risk Score and Plan:   Airway Management Planned:   Additional Equipment:   Intra-op Plan:   Post-operative Plan:   Informed Consent: I have reviewed the patients History and Physical, chart, labs and discussed the procedure including the risks, benefits and alternatives for the proposed anesthesia with the patient or authorized representative who has indicated his/her understanding and acceptance.       Plan Discussed with: CRNA  Anesthesia Plan Comments:         Anesthesia Quick Evaluation

## 2019-06-13 NOTE — Anesthesia Postprocedure Evaluation (Signed)
Anesthesia Post Note  Patient: Amanda Moran  Procedure(s) Performed: ACHILLES REPAIR SECONDARY LEFT (Left ) HAGLUNDS/RETROCALCANEAL OSTECTOMY (Left )  Patient location during evaluation: PACU Anesthesia Type: General Level of consciousness: awake and alert Pain management: pain level controlled Vital Signs Assessment: post-procedure vital signs reviewed and stable Respiratory status: spontaneous breathing, nonlabored ventilation, respiratory function stable and patient connected to nasal cannula oxygen Cardiovascular status: blood pressure returned to baseline and stable Postop Assessment: no apparent nausea or vomiting Anesthetic complications: no     Last Vitals:  Vitals:   06/07/19 1323 06/07/19 1345  BP: (!) 169/104 (!) 146/87  Pulse: 86 83  Resp: 16 18  Temp: (!) 36.4 C   SpO2: 96% 95%    Last Pain:  Vitals:   06/07/19 1345  TempSrc:   PainSc: 0-No pain                 Molli Barrows

## 2019-08-26 DIAGNOSIS — M7731 Calcaneal spur, right foot: Secondary | ICD-10-CM | POA: Diagnosis not present

## 2019-08-26 DIAGNOSIS — M7661 Achilles tendinitis, right leg: Secondary | ICD-10-CM | POA: Diagnosis not present

## 2019-08-27 ENCOUNTER — Other Ambulatory Visit: Payer: Self-pay

## 2019-08-27 ENCOUNTER — Other Ambulatory Visit
Admission: RE | Admit: 2019-08-27 | Discharge: 2019-08-27 | Disposition: A | Discharge status: Home / Self Care | Payer: BC Managed Care – PPO | Source: Ambulatory Visit | Attending: Podiatry | Admitting: Podiatry

## 2019-08-27 ENCOUNTER — Other Ambulatory Visit: Payer: Self-pay | Admitting: Podiatry

## 2019-08-27 DIAGNOSIS — Z20828 Contact with and (suspected) exposure to other viral communicable diseases: Secondary | ICD-10-CM | POA: Insufficient documentation

## 2019-08-27 DIAGNOSIS — Z01812 Encounter for preprocedural laboratory examination: Secondary | ICD-10-CM | POA: Diagnosis not present

## 2019-08-27 LAB — SARS CORONAVIRUS 2 (TAT 6-24 HRS): SARS Coronavirus 2: NEGATIVE

## 2019-08-28 ENCOUNTER — Encounter
Admission: RE | Admit: 2019-08-28 | Discharge: 2019-08-28 | Disposition: A | Discharge status: Home / Self Care | Payer: BC Managed Care – PPO | Source: Ambulatory Visit | Attending: Podiatry | Admitting: Podiatry

## 2019-08-28 ENCOUNTER — Other Ambulatory Visit: Payer: Self-pay

## 2019-08-28 NOTE — Patient Instructions (Signed)
Your procedure is scheduled on: Friday 08/30/19.  Report to DAY SURGERY DEPARTMENT LOCATED ON 2ND FLOOR MEDICAL MALL ENTRANCE. To find out your arrival time please call [phone] between 1PM - 3PM on Thursday 08/29/19.   Remember: Instructions that are not followed completely may result in serious medical risk, up to and including death, or upon the discretion of your surgeon and anesthesiologist your surgery may need to be rescheduled.      _X__ 1. Do not eat food after midnight the night before your procedure.                 No gum chewing or hard candies. You may drink clear liquids up to 2 hours                 before you are scheduled to arrive for your surgery- DO NOT drink clear                 liquids within 2 hours of the start of your surgery.                 Clear Liquids include:  water, apple juice without pulp, clear carbohydrate                 drink such as Clearfast or Gatorade, Black Coffee or Tea (Do not add                 milk or creamer to coffee or tea).   __X__2.  On the morning of surgery brush your teeth with toothpaste and water, you may rinse your mouth with mouthwash if you wish.  Do not swallow any toothpaste or mouthwash.      _X__ 3.  No Alcohol for 24 hours before or after surgery.    _X__ 4.  Do Not Smoke or use e-cigarettes For 24 Hours Prior to Your Surgery.                 Do not use any chewable tobacco products for at least 6 hours prior to                 surgery.   __X__5.  Notify your doctor if there is any change in your medical condition      (cold, fever, infections).      Do not wear jewelry, make-up, hairpins, clips or nail polish. Do not wear lotions, powders, or perfumes.  Do not shave 48 hours prior to surgery. Men may shave face and neck. Do not bring valuables to the hospital.     [facility] is not responsible for any belongings or valuables.    Contacts, dentures/partials or body piercings may not be worn into surgery.  Bring a case for your contacts, glasses or hearing aids, a denture cup will be supplied.     Patients discharged the day of surgery will not be allowed to drive home.     __X__ Take these medicines the morning of surgery with A SIP OF WATER:     1. acetaminophen (TYLENOL) 500 MG tablet      __X__ Use CHG Soap as directed     __X__ Stop taking Etodolac (Lodine) until after your surgery.   __X__ Stop Anti-inflammatories 7 days before surgery such as Advil, Ibuprofen, Motrin, BC or Goodies Powder, Naprosyn, Naproxen, Aleve, Aspirin, Meloxicam. May take Tylenol if needed for pain or discomfort.    __X__ Don't start taking any new herbal supplements until after your  procedure.

## 2019-08-30 ENCOUNTER — Other Ambulatory Visit: Payer: Self-pay

## 2019-08-30 ENCOUNTER — Ambulatory Visit: Payer: BC Managed Care – PPO | Admitting: Anesthesiology

## 2019-08-30 ENCOUNTER — Encounter
Admission: RE | Disposition: A | Discharge status: Home / Self Care | Payer: Self-pay | Source: Home / Self Care | Attending: Podiatry

## 2019-08-30 ENCOUNTER — Ambulatory Visit
Admission: RE | Admit: 2019-08-30 | Discharge: 2019-08-30 | Disposition: A | Discharge status: Home / Self Care | Payer: BC Managed Care – PPO | Attending: Podiatry | Admitting: Podiatry

## 2019-08-30 ENCOUNTER — Encounter: Payer: Self-pay | Admitting: *Deleted

## 2019-08-30 DIAGNOSIS — F419 Anxiety disorder, unspecified: Secondary | ICD-10-CM | POA: Insufficient documentation

## 2019-08-30 DIAGNOSIS — E119 Type 2 diabetes mellitus without complications: Secondary | ICD-10-CM | POA: Diagnosis not present

## 2019-08-30 DIAGNOSIS — R7303 Prediabetes: Secondary | ICD-10-CM | POA: Diagnosis not present

## 2019-08-30 DIAGNOSIS — F17200 Nicotine dependence, unspecified, uncomplicated: Secondary | ICD-10-CM | POA: Diagnosis not present

## 2019-08-30 DIAGNOSIS — M7731 Calcaneal spur, right foot: Secondary | ICD-10-CM | POA: Diagnosis not present

## 2019-08-30 DIAGNOSIS — Z419 Encounter for procedure for purposes other than remedying health state, unspecified: Secondary | ICD-10-CM

## 2019-08-30 DIAGNOSIS — M7661 Achilles tendinitis, right leg: Secondary | ICD-10-CM | POA: Diagnosis not present

## 2019-08-30 DIAGNOSIS — Z79899 Other long term (current) drug therapy: Secondary | ICD-10-CM | POA: Insufficient documentation

## 2019-08-30 DIAGNOSIS — M899 Disorder of bone, unspecified: Secondary | ICD-10-CM | POA: Diagnosis not present

## 2019-08-30 HISTORY — PX: ACHILLES TENDON SURGERY: SHX542

## 2019-08-30 HISTORY — PX: CALCANEAL OSTEOTOMY: SHX1281

## 2019-08-30 LAB — GLUCOSE, CAPILLARY
Glucose-Capillary: 117 mg/dL — ABNORMAL HIGH (ref 70–99)
Glucose-Capillary: 116 mg/dL — ABNORMAL HIGH (ref 70–99)

## 2019-08-30 SURGERY — REPAIR, TENDON, ACHILLES
Anesthesia: General | Site: Ankle | Laterality: Right

## 2019-08-30 MED ORDER — PROPOFOL 10 MG/ML IV BOLUS
INTRAVENOUS | Status: AC
  Filled 2019-08-30: qty 40

## 2019-08-30 MED ORDER — ONDANSETRON HCL 4 MG PO TABS: 4.0000 mg | ORAL_TABLET | Freq: Four times a day (QID) | ORAL | Status: DC | PRN

## 2019-08-30 MED ORDER — BUPIVACAINE LIPOSOME 1.3 % IJ SUSP
INTRAMUSCULAR | Status: DC | PRN
  Administered 2019-08-30 (×2): 10 mL

## 2019-08-30 MED ORDER — ACETAMINOPHEN NICU IV SYRINGE 10 MG/ML
INTRAVENOUS | Status: AC
  Filled 2019-08-30: qty 1

## 2019-08-30 MED ORDER — METOCLOPRAMIDE HCL 5 MG/ML IJ SOLN: 5.0000 mg | Freq: Three times a day (TID) | INTRAMUSCULAR | Status: DC | PRN

## 2019-08-30 MED ORDER — OXYCODONE-ACETAMINOPHEN 5-325 MG PO TABS: 1.0000 | ORAL_TABLET | Freq: Four times a day (QID) | ORAL | 0 refills | Status: DC | PRN

## 2019-08-30 MED ORDER — SUGAMMADEX SODIUM 500 MG/5ML IV SOLN
INTRAVENOUS | Status: AC
  Filled 2019-08-30: qty 5

## 2019-08-30 MED ORDER — ONDANSETRON HCL 4 MG/2ML IJ SOLN: 4.0000 mg | Freq: Once | INTRAMUSCULAR | Status: DC | PRN

## 2019-08-30 MED ORDER — DEXAMETHASONE SODIUM PHOSPHATE 10 MG/ML IJ SOLN
INTRAMUSCULAR | Status: AC
  Filled 2019-08-30: qty 1

## 2019-08-30 MED ORDER — DEXAMETHASONE SODIUM PHOSPHATE 10 MG/ML IJ SOLN
INTRAMUSCULAR | Status: DC | PRN
  Administered 2019-08-30: 10 mg via INTRAVENOUS

## 2019-08-30 MED ORDER — ROCURONIUM BROMIDE 50 MG/5ML IV SOLN
INTRAVENOUS | Status: AC
  Filled 2019-08-30: qty 2

## 2019-08-30 MED ORDER — PROPOFOL 10 MG/ML IV BOLUS
INTRAVENOUS | Status: DC | PRN
  Administered 2019-08-30: 200 mg via INTRAVENOUS

## 2019-08-30 MED ORDER — GLYCOPYRROLATE 0.2 MG/ML IJ SOLN
INTRAMUSCULAR | Status: DC | PRN
  Administered 2019-08-30: 0.2 mg via INTRAVENOUS

## 2019-08-30 MED ORDER — FAMOTIDINE 20 MG PO TABS
20.0000 mg | ORAL_TABLET | Freq: Once | ORAL | Status: AC
  Administered 2019-08-30: 07:00:00 20 mg via ORAL

## 2019-08-30 MED ORDER — BUPIVACAINE-EPINEPHRINE (PF) 0.25% -1:200000 IJ SOLN
INTRAMUSCULAR | Status: AC
  Filled 2019-08-30: qty 30

## 2019-08-30 MED ORDER — LIDOCAINE HCL (CARDIAC) PF 100 MG/5ML IV SOSY
PREFILLED_SYRINGE | INTRAVENOUS | Status: DC | PRN
  Administered 2019-08-30: 100 mg via INTRAVENOUS

## 2019-08-30 MED ORDER — DEXMEDETOMIDINE HCL IN NACL 200 MCG/50ML IV SOLN
INTRAVENOUS | Status: DC | PRN
  Administered 2019-08-30 (×2): 4 ug via INTRAVENOUS

## 2019-08-30 MED ORDER — ONDANSETRON HCL 4 MG/2ML IJ SOLN
INTRAMUSCULAR | Status: AC
  Filled 2019-08-30: qty 4

## 2019-08-30 MED ORDER — DEXMEDETOMIDINE HCL IN NACL 80 MCG/20ML IV SOLN
INTRAVENOUS | Status: AC
  Filled 2019-08-30: qty 20

## 2019-08-30 MED ORDER — MIDAZOLAM HCL 2 MG/2ML IJ SOLN
INTRAMUSCULAR | Status: AC
  Filled 2019-08-30: qty 2

## 2019-08-30 MED ORDER — CEFAZOLIN SODIUM-DEXTROSE 2-4 GM/100ML-% IV SOLN
INTRAVENOUS | Status: AC
  Filled 2019-08-30: qty 100

## 2019-08-30 MED ORDER — LIDOCAINE HCL (PF) 1 % IJ SOLN
INTRAMUSCULAR | Status: AC
  Filled 2019-08-30: qty 60

## 2019-08-30 MED ORDER — ONDANSETRON HCL 4 MG/2ML IJ SOLN: 4.0000 mg | Freq: Four times a day (QID) | INTRAMUSCULAR | Status: DC | PRN

## 2019-08-30 MED ORDER — EPHEDRINE SULFATE 50 MG/ML IJ SOLN
INTRAMUSCULAR | Status: AC
  Filled 2019-08-30: qty 1

## 2019-08-30 MED ORDER — BUPIVACAINE LIPOSOME 1.3 % IJ SUSP
INTRAMUSCULAR | Status: AC
  Filled 2019-08-30: qty 20

## 2019-08-30 MED ORDER — PHENYLEPHRINE HCL (PRESSORS) 10 MG/ML IV SOLN
INTRAVENOUS | Status: AC
  Filled 2019-08-30: qty 1

## 2019-08-30 MED ORDER — ACETAMINOPHEN 10 MG/ML IV SOLN
INTRAVENOUS | Status: DC | PRN
  Administered 2019-08-30: 1000 mg via INTRAVENOUS

## 2019-08-30 MED ORDER — FENTANYL CITRATE (PF) 100 MCG/2ML IJ SOLN
INTRAMUSCULAR | Status: AC
  Filled 2019-08-30: qty 2

## 2019-08-30 MED ORDER — FAMOTIDINE 20 MG PO TABS
ORAL_TABLET | ORAL | Status: AC
  Administered 2019-08-30: 07:00:00 20 mg via ORAL
  Filled 2019-08-30: qty 1

## 2019-08-30 MED ORDER — FENTANYL CITRATE (PF) 100 MCG/2ML IJ SOLN
INTRAMUSCULAR | Status: DC | PRN
  Administered 2019-08-30 (×4): 50 ug via INTRAVENOUS

## 2019-08-30 MED ORDER — POVIDONE-IODINE 7.5 % EX SOLN
Freq: Once | CUTANEOUS | Status: DC
  Filled 2019-08-30: qty 118

## 2019-08-30 MED ORDER — ESMOLOL HCL 100 MG/10ML IV SOLN
INTRAVENOUS | Status: AC
  Filled 2019-08-30: qty 10

## 2019-08-30 MED ORDER — LACTATED RINGERS IV SOLN
INTRAVENOUS | Status: DC
  Administered 2019-08-30: 07:00:00 via INTRAVENOUS

## 2019-08-30 MED ORDER — BUPIVACAINE HCL (PF) 0.5 % IJ SOLN
INTRAMUSCULAR | Status: AC
  Filled 2019-08-30: qty 30

## 2019-08-30 MED ORDER — BUPIVACAINE-EPINEPHRINE (PF) 0.25% -1:200000 IJ SOLN
INTRAMUSCULAR | Status: DC | PRN
  Administered 2019-08-30: 10 mL

## 2019-08-30 MED ORDER — SUCCINYLCHOLINE CHLORIDE 20 MG/ML IJ SOLN
INTRAMUSCULAR | Status: AC
  Filled 2019-08-30: qty 1

## 2019-08-30 MED ORDER — PROPOFOL 10 MG/ML IV BOLUS
INTRAVENOUS | Status: AC
  Filled 2019-08-30: qty 20

## 2019-08-30 MED ORDER — BUPIVACAINE HCL (PF) 0.25 % IJ SOLN
INTRAMUSCULAR | Status: AC
  Filled 2019-08-30: qty 30

## 2019-08-30 MED ORDER — PHENYLEPHRINE HCL (PRESSORS) 10 MG/ML IV SOLN
INTRAVENOUS | Status: DC | PRN
  Administered 2019-08-30 (×2): 100 ug via INTRAVENOUS

## 2019-08-30 MED ORDER — MIDAZOLAM HCL 2 MG/2ML IJ SOLN
INTRAMUSCULAR | Status: DC | PRN
  Administered 2019-08-30: 2 mg via INTRAVENOUS

## 2019-08-30 MED ORDER — FENTANYL CITRATE (PF) 100 MCG/2ML IJ SOLN: 25.0000 ug | INTRAMUSCULAR | Status: DC | PRN

## 2019-08-30 MED ORDER — SUCCINYLCHOLINE CHLORIDE 20 MG/ML IJ SOLN
INTRAMUSCULAR | Status: DC | PRN
  Administered 2019-08-30: 120 mg via INTRAVENOUS

## 2019-08-30 MED ORDER — EPHEDRINE SULFATE 50 MG/ML IJ SOLN
INTRAMUSCULAR | Status: DC | PRN
  Administered 2019-08-30 (×2): 5 mg via INTRAVENOUS

## 2019-08-30 MED ORDER — CEFAZOLIN SODIUM-DEXTROSE 2-4 GM/100ML-% IV SOLN: 2.0000 g | INTRAVENOUS | Status: DC

## 2019-08-30 MED ORDER — GLYCOPYRROLATE 0.2 MG/ML IJ SOLN
INTRAMUSCULAR | Status: AC
  Filled 2019-08-30: qty 1

## 2019-08-30 MED ORDER — SUGAMMADEX SODIUM 500 MG/5ML IV SOLN
INTRAVENOUS | Status: DC | PRN
  Administered 2019-08-30: 480 mg via INTRAVENOUS

## 2019-08-30 MED ORDER — METOCLOPRAMIDE HCL 10 MG PO TABS: 5.0000 mg | ORAL_TABLET | Freq: Three times a day (TID) | ORAL | Status: DC | PRN

## 2019-08-30 MED ORDER — LIDOCAINE HCL (PF) 2 % IJ SOLN
INTRAMUSCULAR | Status: AC
  Filled 2019-08-30: qty 10

## 2019-08-30 MED ORDER — ESMOLOL HCL 100 MG/10ML IV SOLN
INTRAVENOUS | Status: DC | PRN
  Administered 2019-08-30: 30 mg via INTRAVENOUS

## 2019-08-30 MED ORDER — ONDANSETRON HCL 4 MG/2ML IJ SOLN
INTRAMUSCULAR | Status: DC | PRN
  Administered 2019-08-30 (×2): 4 mg via INTRAVENOUS

## 2019-08-30 MED ORDER — SODIUM CHLORIDE 0.9 % IV SOLN
INTRAVENOUS | Status: DC | PRN
  Administered 2019-08-30: 08:00:00 15 ug/min via INTRAVENOUS

## 2019-08-30 MED ORDER — ROCURONIUM BROMIDE 100 MG/10ML IV SOLN
INTRAVENOUS | Status: DC | PRN
  Administered 2019-08-30: 40 mg via INTRAVENOUS
  Administered 2019-08-30 (×2): 10 mg via INTRAVENOUS

## 2019-08-30 SURGICAL SUPPLY — 70 items
ANCHOR 4.5 FOOTPRINT ULTRA (Anchor) ×1 IMPLANT
BIT DRILL 4X4.5 FOOTPRINT STR (BIT) IMPLANT
BLADE SURG 15 STRL LF DISP TIS (BLADE) ×2 IMPLANT
BLADE SURG 15 STRL SS (BLADE) ×2
BLADE SURG MINI STRL (BLADE) ×2 IMPLANT
BNDG COHESIVE 4X5 TAN STRL (GAUZE/BANDAGES/DRESSINGS) ×2 IMPLANT
BNDG CONFORM 2 STRL LF (GAUZE/BANDAGES/DRESSINGS) ×1 IMPLANT
BNDG CONFORM 3 STRL LF (GAUZE/BANDAGES/DRESSINGS) ×2 IMPLANT
BNDG ELASTIC 4X5.8 VLCR NS LF (GAUZE/BANDAGES/DRESSINGS) ×4 IMPLANT
BNDG ESMARK 4X12 TAN STRL LF (GAUZE/BANDAGES/DRESSINGS) ×1 IMPLANT
BNDG ESMARK 6X12 TAN STRL LF (GAUZE/BANDAGES/DRESSINGS) ×1 IMPLANT
BNDG GAUZE 4.5X4.1 6PLY STRL (MISCELLANEOUS) ×2 IMPLANT
CANISTER SUCT 1200ML W/VALVE (MISCELLANEOUS) ×2 IMPLANT
COVER WAND RF STERILE (DRAPES) ×2 IMPLANT
CUFF TOURN SGL QUICK 18X4 (TOURNIQUET CUFF) ×2 IMPLANT
DRAPE C-ARM XRAY 36X54 (DRAPES) ×2 IMPLANT
DRAPE FLUOR MINI C-ARM 54X84 (DRAPES) ×2 IMPLANT
DRILL 4X4.5 FOOTPRINT STR (BIT) ×2
DURAPREP 26ML APPLICATOR (WOUND CARE) ×2 IMPLANT
ELECT REM PT RETURN 9FT ADLT (ELECTROSURGICAL) ×2
ELECTRODE REM PT RTRN 9FT ADLT (ELECTROSURGICAL) ×1 IMPLANT
GAUZE SPONGE 4X4 12PLY STRL (GAUZE/BANDAGES/DRESSINGS) ×2 IMPLANT
GAUZE XEROFORM 1X8 LF (GAUZE/BANDAGES/DRESSINGS) ×2 IMPLANT
GLOVE BIO SURGEON STRL SZ7.5 (GLOVE) ×2 IMPLANT
GLOVE INDICATOR 8.0 STRL GRN (GLOVE) ×2 IMPLANT
GOWN STRL REUS W/ TWL LRG LVL3 (GOWN DISPOSABLE) ×2 IMPLANT
GOWN STRL REUS W/TWL LRG LVL3 (GOWN DISPOSABLE) ×2
HANDLE YANKAUER SUCT BULB TIP (MISCELLANEOUS) ×2 IMPLANT
KIT TURNOVER KIT A (KITS) ×2 IMPLANT
LABEL OR SOLS (LABEL) ×1 IMPLANT
NDL FILTER BLUNT 18X1 1/2 (NEEDLE) ×1 IMPLANT
NDL HYPO 25X1 1.5 SAFETY (NEEDLE) ×3 IMPLANT
NDL MAYO CATGUT SZ5 (NEEDLE) ×1
NDL SUT 5 .5 CRC TPR PNT MAYO (NEEDLE) ×1 IMPLANT
NEEDLE FILTER BLUNT 18X 1/2SAF (NEEDLE) ×1
NEEDLE FILTER BLUNT 18X1 1/2 (NEEDLE) ×1 IMPLANT
NEEDLE HYPO 25X1 1.5 SAFETY (NEEDLE) ×6 IMPLANT
NS IRRIG 500ML POUR BTL (IV SOLUTION) ×3 IMPLANT
PACK EXTREMITY ARMC (MISCELLANEOUS) ×2 IMPLANT
PAD CAST CTTN 4X4 STRL (SOFTGOODS) ×1 IMPLANT
PAD PREP 24X41 OB/GYN DISP (PERSONAL CARE ITEMS) ×2 IMPLANT
PADDING CAST COTTON 4X4 STRL (SOFTGOODS) ×1
PENCIL ELECTRO HAND CTR (MISCELLANEOUS) ×2 IMPLANT
RASP SM TEAR CROSS CUT (RASP) ×2 IMPLANT
SPLINT CAST 1 STEP 4X30 (MISCELLANEOUS) ×2 IMPLANT
SPLINT CAST 1 STEP 5X30 WHT (MISCELLANEOUS) ×1 IMPLANT
SPLINT FAST PLASTER 5X30 (CAST SUPPLIES)
SPLINT PLASTER CAST FAST 5X30 (CAST SUPPLIES) ×1 IMPLANT
SPONGE LAP 18X18 RF (DISPOSABLE) ×2 IMPLANT
STAPLER SKIN PROX 35W (STAPLE) ×2 IMPLANT
STOCKINETTE M/LG 89821 (MISCELLANEOUS) ×2 IMPLANT
STRIP CLOSURE SKIN 1/2X4 (GAUZE/BANDAGES/DRESSINGS) ×2 IMPLANT
SUT MNCRL+ 5-0 VIOLET P-3 (SUTURE) ×1 IMPLANT
SUT MONOCRYL 5-0 (SUTURE) ×1
SUT PDS AB 0 CT1 27 (SUTURE) IMPLANT
SUT VIC AB 0 SH 27 (SUTURE) ×1 IMPLANT
SUT VIC AB 2-0 CT1 27 (SUTURE) ×1
SUT VIC AB 2-0 CT1 TAPERPNT 27 (SUTURE) ×1 IMPLANT
SUT VIC AB 2-0 SH 27 (SUTURE)
SUT VIC AB 2-0 SH 27XBRD (SUTURE) ×2 IMPLANT
SUT VIC AB 3-0 SH 27 (SUTURE) ×2
SUT VIC AB 3-0 SH 27X BRD (SUTURE) ×1 IMPLANT
SUT VIC AB 4-0 FS2 27 (SUTURE) ×1 IMPLANT
SUT VICRYL AB 3-0 FS1 BRD 27IN (SUTURE) ×1 IMPLANT
SWABSTK COMLB BENZOIN TINCTURE (MISCELLANEOUS) ×2 IMPLANT
SYR 10ML LL (SYRINGE) ×4 IMPLANT
SYR 3ML LL SCALE MARK (SYRINGE) ×2 IMPLANT
WAND TOPAZ MICRO DEBRIDER (MISCELLANEOUS) ×1 IMPLANT
WIRE MAGNUM (SUTURE) ×2 IMPLANT
WIRE Z .062 C-WIRE SPADE TIP (WIRE) ×2 IMPLANT

## 2019-08-30 NOTE — Anesthesia Postprocedure Evaluation (Signed)
Anesthesia Post Note  Patient: SYD BEARE  Procedure(s) Performed: ACHILLES REPAIR SECONDARY RIGHT (Right Ankle) HAGLUNDS/RETROCALCANEAL (OSTECTOMY) RIGHT (Right Ankle)  Patient location during evaluation: PACU Anesthesia Type: General Level of consciousness: awake and alert Pain management: pain level controlled Vital Signs Assessment: post-procedure vital signs reviewed and stable Respiratory status: spontaneous breathing and respiratory function stable Cardiovascular status: stable Anesthetic complications: no     Last Vitals:  Vitals:   08/30/19 0954 08/30/19 0956  BP: 122/83   Pulse: (!) 108 92  Resp: (!) 21 (!) 26  Temp:    SpO2: 100% 98%    Last Pain:  Vitals:   08/30/19 0956  TempSrc:   PainSc: 0-No pain                 Ratasha Fabre K

## 2019-08-30 NOTE — H&P (Signed)
HISTORY AND PHYSICAL INTERVAL NOTE:  08/30/2019  7:25 AM  Amanda Moran  has presented today for surgery, with the diagnosis of M76.61 ACHILLES REPAIR SECONDARY RIGHT M77.31 POSTERIOR CALCANEAL EXOSTOSIS, RIGHT.  The various methods of treatment have been discussed with the patient.  No guarantees were given.  After consideration of risks, benefits and other options for treatment, the patient has consented to surgery.  I have reviewed the patients' chart and labs.     Moran history and physical examination was performed in my office.  The patient was reexamined.  There have been no changes to this history and physical examination.  Amanda Moran

## 2019-08-30 NOTE — Op Note (Signed)
Operative note   Surgeon:Keshawn Sundberg Lawyer: None    Preop diagnosis: 1.  Achilles insertional tendinitis 2.  Posterior calcaneal exostosis right lower extremity    Postop diagnosis: Same    Procedure: 1 Achilles tendon debridement and repair right posterior Achilles insertion tube.  Posterior calcaneal exostosis right lower extremity    EBL: 10 mL's    Anesthesia:local and general.  Local consisted of a one-to-one mixture of 0.25% bupivacaine with epinephrine and Exparel long-acting anesthetic.  A total of 20 cc was used initially.  10 mL's of Exparel was infiltrated at the end of the case.    Hemostasis: Epinephrine infiltrated along the incision site    Specimen: None    Complications: None    Operative indications:Amanda Moran is an 48 y.o. that presents today for surgical intervention.  The risks/benefits/alternatives/complications have been discussed and consent has been given.    Procedure:  Patient was brought into the OR and placed on the operating table in theprone position. After anesthesia was obtained theright lower extremity was prepped and draped in usual sterile fashion.  Attention was directed to the posterior aspect of the calcaneus at the Achilles insertional site longitudinal incision was performed.  Sharp and blunt dissection carried down to the peritenon.  This was incised.  A longitudinal splitting incision of the Achilles tendon was made at the calcaneal insertional site.  This was dissected away medial and lateral and superiorly.  This time exposed exostosis was noted.  With a combination of a curved osteotome and a power rasp all of the bone spur was moved from the posterior as well as the superior aspect of the calcaneus.  Good remodeling was noted.  The wound was flushed with copious amounts of irrigation.  At this time the Achilles tendon was infiltrated with a Topaz wand.  The distal aspect of the Achilles was then debrided and surgically excised of  the nonviable inflamed Achilles tendon.  The tendon was then repaired with a 3-0 Vicryl for the deeper and superficial incision site.  Next a #2 Magnum wire was used in a short Krakw stitch and brought back distally to the Achilles insertional site on the calcaneus.  Next a footprint bone anchor was placed into the posterior calcaneus.  The Achilles tendon was brought down into the bone anchor and tightened appropriately.  Good stability was noted.  Further flushing was performed.  The peritenon was closed with a 3-0 Vicryl.  The subcutaneous tissue closed with 3-0 Vicryl and the skin closed with a 4-0 undyed Monocryl.  Steri-Strips bulky sterile dressing was applied.  Patient was then placed in 90 degrees neutral position in her equalizer walker boot.    Patient tolerated the procedure and anesthesia well.  Was transported from the OR to the PACU with all vital signs stable and vascular status intact. To be discharged per routine protocol.  Will follow up in approximately 1 week in the outpatient clinic.

## 2019-08-30 NOTE — Anesthesia Preprocedure Evaluation (Addendum)
Anesthesia Evaluation  Patient identified by MRN, date of birth, ID band Patient awake    Reviewed: Allergy & Precautions, NPO status , Patient's Chart, lab work & pertinent test results  History of Anesthesia Complications Negative for: history of anesthetic complications  Airway Mallampati: III       Dental   Pulmonary neg sleep apnea, neg COPD, Current Smoker and Patient abstained from smoking.,           Cardiovascular (-) hypertension(-) Past MI and (-) CHF (-) dysrhythmias (-) Valvular Problems/Murmurs     Neuro/Psych neg Seizures Anxiety    GI/Hepatic Neg liver ROS, neg GERD  ,  Endo/Other  diabetes (borderline, no meds)Morbid obesity  Renal/GU negative Renal ROS     Musculoskeletal   Abdominal   Peds  Hematology  (+) anemia ,   Anesthesia Other Findings   Reproductive/Obstetrics                           Anesthesia Physical Anesthesia Plan  ASA: III  Anesthesia Plan: General   Post-op Pain Management:    Induction: Intravenous  PONV Risk Score and Plan: 2 and Ondansetron and Midazolam  Airway Management Planned: Oral ETT  Additional Equipment:   Intra-op Plan:   Post-operative Plan:   Informed Consent: I have reviewed the patients History and Physical, chart, labs and discussed the procedure including the risks, benefits and alternatives for the proposed anesthesia with the patient or authorized representative who has indicated his/her understanding and acceptance.       Plan Discussed with:   Anesthesia Plan Comments:         Anesthesia Quick Evaluation

## 2019-08-30 NOTE — Discharge Instructions (Signed)
Achilles Tendon Tear Repair, Care After This sheet gives you information about how to care for yourself after your procedure. Your health care provider may also give you more specific instructions. If you have problems or questions, contact your health care provider. What can I expect after the procedure? After the procedure, it is common to have:  Numbness in your foot. This should go away within 24 hours.  Pain. It may take 6 months before you can return to your regular activity level. However, complete recovery can take a year or longer. Follow these instructions at home: If you have a splint:  Wear the splint as told by your health care provider. Remove it only as told by your health care provider.  Loosen the splint if your toes tingle, become numb, or turn cold and blue.  Keep the splint clean.  If the splint is not waterproof: ? Do not let it get wet. ? Cover it with a watertight covering when you take a bath or a shower. If you have a cast:  Do not put pressure on any part of the cast or splint until it is fully hardened. This may take several hours.  Do not stick anything inside the cast to scratch your skin. Doing that increases your risk of infection.  Check the skin around the cast every day. Tell your health care provider about any concerns.  You may put lotion on dry skin around the edges of the cast. Do not put lotion on the skin underneath the cast.  Keep the cast clean.  If the cast is not waterproof: ? Do not let it get wet. ? Cover it with a watertight covering when you take a bath or a shower. Incision care   Follow instructions from your health care provider about how to take care of your incision. Make sure you: ? Wash your hands with soap and water before you change your bandage (dressing). If soap and water are not available, use hand sanitizer. ? Change your dressing as told by your health care provider. ? Leave stitches (sutures) or staples in  place. These skin closures may need to stay in place for 2 weeks or longer.  Check your incision area every day for signs of infection. Check for: ? Redness, swelling, or pain. ? Fluid or blood. ? Warmth. ? Pus or a bad smell. Medicines  Take pain medicines only as told by your health care provider.  Do not take over-the-counter medicines unless your health care provider says it is okay.  To prevent or treat constipation while you are taking prescription pain medicine, your health care provider may recommend that you: ? Drink enough fluid to keep your urine clear or pale yellow. ? Take over-the-counter or prescription medicines. ? Eat foods that are high in fiber, such as fresh fruits and vegetables, whole grains, and beans. ? Limit foods that are high in fat and processed sugars, such as fried and sweet foods. Activity  Do not walk on or put weight on your injured leg.  Use crutches or another walking aid.  Follow your health care provider's instructions on how to move your ankle and how much weight you can put on your leg. If you have a cast or splint, you should receive these instructions after it is removed.  Follow your rehabilitation plan. Do exercises as told by your health care provider or physical therapist. Between 2-6 weeks after surgery, your surgeon may recommend: ? Putting some weight on your leg  while wearing your walking boot or cast. ? Doing ankle motion exercises.  Around 6 weeks after surgery, your surgeon may recommend: ? Putting some weight on your leg without the assistance of a boot or cast. ? Starting physical therapy to improve ankle motion and to strengthen muscles in your leg.  Avoid stretching your Achilles tendon for at least 6 months or as told by your physical therapist.  Do not return to physical activity or sports until you are cleared by your health care provider or physical therapist. Driving  Do not drive or operate heavy machinery while  taking prescription pain medicine.  If you have a cast, splint, or boot on your leg, ask your health care provider when it is safe for you to drive. General instructions   Raise (elevate) your leg above the level of your heart while you are sitting or lying down.  Do not take baths, swim or use a hot tub until your health care provider approves.  If directed, put ice on the injured area: ? If you have a removable splint, remove it as told by your health care provider. ? Put ice in a plastic bag. ? Place a towel between your skin and the bag. ? Leave the ice on for 20 minutes, 2-3 times a day.  See a physical therapist if directed by your health care provider. A physical therapist can help you regain ankle motion and strengthen your leg muscles.  Keep all follow-up visits as told by your health care provider. This is important. If you have a cast or splint, you should see your health care provider about 2 weeks after surgery to have it removed. If you have stitches, your health care provider will also take them out during this time. Contact a health care provider if:  You have chills.  You have a fever.  Your pain medicine is not working.  You have persistent numbness or burning.  You have redness, swelling, or pain around your incision.  You have fluid or blood coming from your incision.  Your incision feels warm to the touch.  You have pus or a bad smell coming from your incision.  You feel like your cast or splint is too tight.  You are unable to wiggle your toes.  You have a cast or splint and have increased pain.  You have numbness or tingling in your foot or toes.  You notice cracks or soft spots on your cast. Get help right away if:  You have swelling, pain, or numbness that is getting worse.  You are bleeding through your cast or splint.  You have chest pain.  You have trouble breathing. Summary  After your procedure, it is common to have pain and  numbness in your foot. Numbness should go away within 24 hours after surgery.  If you have a splint, loosen it if your toes tingle, become numb, or turn cold and blue. Keep the splint clean and dry.  If you have a cast, do not put pressure on it until it hardens. To avoid infections, do not stick objects under the cast to scratch the skin and do not put lotion under the cast. Keep the cast clean and dry.  Follow your health care provider's instructions about caring for your incision, avoiding some activities, taking medicines, and keeping follow-up visits. This information is not intended to replace advice given to you by your health care provider. Make sure you discuss any questions you have with your health  care provider. Document Released: 08/17/2004 Document Revised: 10/11/2018 Document Reviewed: 11/25/2016 Elsevier Patient Education  2020 [place]   1) The drugs that you were given will stay in your system until tomorrow so for the next 24 hours you should not:  A) Drive an automobile B) Make any legal decisions C) Drink any alcoholic beverage   2) You may resume regular meals tomorrow.  Today it is better to start with liquids and gradually work up to solid foods.  You may eat anything you prefer, but it is better to start with liquids, then soup and crackers, and gradually work up to solid foods.   3) Please notify your doctor immediately if you have any unusual bleeding, trouble breathing, redness and pain at the surgery site, drainage, fever, or pain not relieved by medication.    4) Additional Instructions:        Please contact your physician with any problems or Same Day Surgery at [phone], Monday through Friday 6 am to 4 pm, or [place] at [facility] number at [phone].

## 2019-08-30 NOTE — Transfer of Care (Signed)
Immediate Anesthesia Transfer of Care Note  Patient: Amanda Moran  Procedure(s) Performed: ACHILLES REPAIR SECONDARY RIGHT (Right Ankle) HAGLUNDS/RETROCALCANEAL (OSTECTOMY) RIGHT (Right Ankle)  Patient Location: PACU  Anesthesia Type:General  Level of Consciousness: drowsy, patient cooperative and responds to stimulation  Airway & Oxygen Therapy: Patient Spontanous Breathing and Patient connected to face mask oxygen  Post-op Assessment: Report given to RN and Post -op Vital signs reviewed and stable  Post vital signs: Reviewed and stable  Last Vitals:  Vitals Value Taken Time  BP 115/59 08/30/19 0938  Temp    Pulse 109 08/30/19 0942  Resp 21 08/30/19 0942  SpO2 100 % 08/30/19 0942  Vitals shown include unvalidated device data.  Last Pain:  Vitals:   08/30/19 0612  TempSrc: Tympanic  PainSc: 5       Patients Stated Pain Goal: 3 ([id number])  Complications: No apparent anesthesia complications

## 2019-08-30 NOTE — Anesthesia Post-op Follow-up Note (Signed)
Anesthesia QCDR form completed.        

## 2019-08-30 NOTE — Anesthesia Postprocedure Evaluation (Deleted)
Anesthesia Post Note  Patient: Amanda Moran  Procedure(s) Performed: ACHILLES REPAIR SECONDARY RIGHT (Right Ankle) HAGLUNDS/RETROCALCANEAL (OSTECTOMY) RIGHT (Right Ankle)  Patient location during evaluation: PACU Anesthesia Type: General Level of consciousness: awake and alert Pain management: pain level controlled Vital Signs Assessment: post-procedure vital signs reviewed and stable Respiratory status: spontaneous breathing and respiratory function stable Cardiovascular status: stable Anesthetic complications: no     Last Vitals:  Vitals:   08/30/19 0612  BP: (!) 179/104  Pulse: 87  Resp: 18  Temp: 36.8 C  SpO2: 98%    Last Pain:  Vitals:   08/30/19 0612  TempSrc: Tympanic  PainSc: 5                  KEPHART,WILLIAM K

## 2019-08-30 NOTE — Anesthesia Procedure Notes (Signed)
Procedure Name: Intubation Performed by: Kelton Pillar, CRNA Pre-anesthesia Checklist: Patient identified, Emergency Drugs available, Suction available and Patient being monitored Patient Re-evaluated:Patient Re-evaluated prior to induction Oxygen Delivery Method: Circle system utilized Preoxygenation: Pre-oxygenation with 100% oxygen Induction Type: IV induction Ventilation: Mask ventilation without difficulty Laryngoscope Size: McGraph and 3 Grade View: Grade I Tube type: Oral Tube size: 7.0 mm Number of attempts: 1 Airway Equipment and Method: Stylet Placement Confirmation: ETT inserted through vocal cords under direct vision,  breath sounds checked- equal and bilateral,  CO2 detector and positive ETCO2 Secured at: 22 cm Tube secured with: Tape Dental Injury: Teeth and Oropharynx as per pre-operative assessment

## 2019-09-01 ENCOUNTER — Encounter: Payer: Self-pay | Admitting: Podiatry

## 2019-09-05 DIAGNOSIS — M7661 Achilles tendinitis, right leg: Secondary | ICD-10-CM | POA: Diagnosis not present

## 2019-09-23 DIAGNOSIS — M7661 Achilles tendinitis, right leg: Secondary | ICD-10-CM | POA: Diagnosis not present

## 2019-11-04 ENCOUNTER — Other Ambulatory Visit: Payer: Self-pay

## 2020-01-06 ENCOUNTER — Telehealth: Payer: Self-pay | Admitting: Family Medicine

## 2020-01-06 NOTE — Telephone Encounter (Signed)
etodolac (LODINE) 500 MG tablet    Patient is requesting refill.    Pharmacy:  [id number] - [place], [address] Phone:  [phone]  Fax:  [phone]

## 2020-01-06 NOTE — Telephone Encounter (Signed)
Routing to provider  

## 2020-01-06 NOTE — Telephone Encounter (Signed)
We have never written that Rx.

## 2020-01-06 NOTE — Telephone Encounter (Signed)
Pt called stating she is not seeing her podiatrist anymore and [place] requesting to have pcp refill it. Please advise.

## 2020-01-06 NOTE — Telephone Encounter (Signed)
Called and left a message letting patient know that she should get this from podiatry.

## 2020-01-07 NOTE — Telephone Encounter (Signed)
Patient was advised in VM that she needed an appointment for this medication since we have never written it.

## 2020-03-10 ENCOUNTER — Encounter: Payer: Self-pay | Admitting: Nurse Practitioner

## 2020-03-10 ENCOUNTER — Other Ambulatory Visit: Payer: Self-pay

## 2020-03-10 ENCOUNTER — Ambulatory Visit (INDEPENDENT_AMBULATORY_CARE_PROVIDER_SITE_OTHER): Payer: BC Managed Care – PPO | Admitting: Nurse Practitioner

## 2020-03-10 VITALS — BP 131/87 | HR 86 | Temp 98.2°F | Ht 64.0 in | Wt 270.0 lb

## 2020-03-10 DIAGNOSIS — M542 Cervicalgia: Secondary | ICD-10-CM | POA: Insufficient documentation

## 2020-03-10 DIAGNOSIS — M549 Dorsalgia, unspecified: Secondary | ICD-10-CM | POA: Insufficient documentation

## 2020-03-10 MED ORDER — CYCLOBENZAPRINE HCL 10 MG PO TABS: 10.0000 mg | ORAL_TABLET | Freq: Three times a day (TID) | ORAL | 0 refills | Status: DC | PRN

## 2020-03-10 NOTE — Assessment & Plan Note (Addendum)
Acute, ongoing.  No red flags in history or examination.  Unable to give IM Toradol in office today due to daily NSAID use.  Will start muscle relaxer.  Advised of side effects and not to drive while taking muscle relaxer.  Back exercise handout given.  If not better in ~2 weeks, will consider PT.

## 2020-03-10 NOTE — Progress Notes (Signed)
BP 131/87 (BP Location: Left Arm, Patient Position: Sitting, Cuff Size: Normal)   Pulse 86   Temp 98.2 F (36.8 C) (Oral)   Ht 5\' 4"  (1.626 m)   Wt 270 lb (122.5 kg)   LMP  (LMP Unknown)   SpO2 98%   BMI 46.35 kg/m    Subjective:    Patient ID: Amanda Moran, female    DOB: Sep 19, 1971, 49 y.o.   MRN: [id number]  HPI: Amanda Moran is a 49 y.o. female presenting for back and neck pain.  Chief Complaint  Patient presents with  . Back Pain    Ongoing appx 3 weeks.  . Neck Pain  . Shoulder Pain   BACK PAIN Duration: 3 week Mechanism of injury: trauma at work Location: bilateral and upper back Onset: sudden Severity: severe Quality: throbbing Frequency: constant Radiation: sideways towards shoulder blade Aggravating factors: lifting, movement, walking and bending Alleviating factors: Asper cream, heat and muscle relaxer Status: worse Treatments attempted: muscle relaxer, Tiger, Asper cream with Lidocaine   Relief with NSAIDs?: unable to tell, takes NSAIDs daily for prior injury Nighttime pain:  yes; when resting and laying  Paresthesias / decreased sensation:  no Bowel / bladder incontinence:  no Fevers:  no Dysuria / urinary frequency:  no  NECK PAIN Onset: 2 weeks ago Location: started right in the middle of the neck along shoulder blades Duration: constant Severity:  Severe; gets better at nighttime Quality: throbbing to dull deep pain Frequency: constant Radiation: yes; right ear to top of right shoulder Aggravating factors:  lifting, movement and pain increased with coughing Alleviating factors: straight posture and muscle relaxer Weakness:  no Status: worse Treatments attempted: heat, muscle relaxer and physical therapy  Relief with NSAIDs?:  unable to tell, takes NSAIDs daily for prior injury Paresthesias / decreased sensation:  yes  Fevers:  no   Patient reports she takes etodolac with Tylenol daily and therefore cannot take other NSAIDs.  She  reports that yesterday, she could not get out of the bed due to pain and today she has been unable to work due to the pain.  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  No Known Allergies  Outpatient Encounter Medications as of 03/10/2020  Medication Sig  . acetaminophen (TYLENOL) 500 MG tablet Take 1,000 mg by mouth every 8 (eight) hours as needed for mild pain or headache.   . etodolac (LODINE) 500 MG tablet Take 500 mg by mouth 2 (two) times daily.  . cyclobenzaprine (FLEXERIL) 10 MG tablet Take 1 tablet (10 mg total) by mouth 3 (three) times daily as needed for muscle spasms.  . [DISCONTINUED] oxyCODONE-acetaminophen (PERCOCET) 5-325 MG tablet Take 1-2 tablets by mouth every 6 (six) hours as needed for severe pain. (Patient not taking: Reported on 03/10/2020)   No facility-administered encounter medications on file as of 03/10/2020.   Patient Active Problem List   Diagnosis Date Noted  . Acute bilateral back pain 03/10/2020  . Neck pain 03/10/2020  . Acute anxiety 04/29/2019  . Morbid obesity ([place]) 03/11/2019  . Complete tear, knee, anterior cruciate ligament 03/22/2017  . IFG (impaired fasting glucose) 08/06/2015  . Iron deficiency anemia due to chronic blood loss 08/06/2015   Past Medical History:  Diagnosis Date  . Abnormal uterine bleeding 05/20/2015   Overview:  Hgb 7.9 on 5/19 preoperatively. At [facility] preop.   . Acanthosis nigricans   . Anemia   .  Anxiety   . Diabetes mellitus without complication [facility])    patient unaware  . Fibroids   . Fibroids   . Headache   . Iron deficiency anemia    Relevant past medical, surgical, family and social history reviewed and updated as indicated. Interim medical history since our last visit reviewed.  Review of Systems  Constitutional: Positive for activity change. Negative for fatigue and fever.  Genitourinary: Negative.  Negative for dysuria, enuresis and frequency.    Musculoskeletal: Positive for back pain, myalgias, neck pain and neck stiffness. Negative for gait problem and joint swelling.  Neurological: Positive for numbness. Negative for dizziness, tremors, weakness, light-headedness and headaches.  Psychiatric/Behavioral: Negative.  Negative for agitation, confusion and sleep disturbance. The patient is not nervous/anxious.    Per HPI unless specifically indicated above    Objective:    BP 131/87 (BP Location: Left Arm, Patient Position: Sitting, Cuff Size: Normal)   Pulse 86   Temp 98.2 F (36.8 C) (Oral)   Ht 5\' 4"  (1.626 m)   Wt 270 lb (122.5 kg)   LMP  (LMP Unknown)   SpO2 98%   BMI 46.35 kg/m   Wt Readings from Last 3 Encounters:  03/10/20 270 lb (122.5 kg)  08/28/19 270 lb (122.5 kg)  06/04/19 270 lb (122.5 kg)    Physical Exam Vitals and nursing note reviewed.  Constitutional:      General: She is not in acute distress.    Appearance: She is obese. She is not toxic-appearing.  HENT:     Head: Normocephalic and atraumatic.     Right Ear: External ear normal.     Left Ear: External ear normal.  Eyes:     General: No scleral icterus.    Extraocular Movements: Extraocular movements intact.  Pulmonary:     Effort: Pulmonary effort is normal. No respiratory distress.  Musculoskeletal:     Right shoulder: Tenderness present. No swelling. Decreased range of motion. Normal strength.     Left shoulder: Tenderness present. No swelling. Decreased range of motion. Normal strength.     Cervical back: Rigidity and tenderness present. Pain with movement and muscular tenderness present. Decreased range of motion.  Neurological:     General: No focal deficit present.     Mental Status: She is alert and oriented to person, place, and time.     Motor: No weakness.     Gait: Gait normal.  Psychiatric:        Mood and Affect: Mood normal.        Behavior: Behavior normal.        Thought Content: Thought content normal.        Judgment:  Judgment normal.       Assessment & Plan:   Problem List Items Addressed This Visit      Other   Acute bilateral back pain    Acute, ongoing.  No red flags in history or examination.  Unable to give IM Toradol in office today due to daily NSAID use.  Will start muscle relaxer.  Advised of side effects and not to drive while taking muscle relaxer.  Back exercise handout given.  If not better in ~2 weeks, will consider PT.      Relevant Medications   cyclobenzaprine (FLEXERIL) 10 MG tablet   Neck pain - Primary    Acute, ongoing.  No red flags in history or examination.  Unable to give IM Toradol in office today due to daily NSAID use.  Will start muscle relaxer.  Advised of side effects and not to drive while taking muscle relaxer.  Neck exercise handout given.  If not better in ~2 weeks, will consider PT.      Relevant Medications   cyclobenzaprine (FLEXERIL) 10 MG tablet       Follow up plan: Return if symptoms worsen or fail to improve.

## 2020-03-10 NOTE — Patient Instructions (Signed)
Back Exercises The following exercises strengthen the muscles that help to support the trunk and back. They also help to keep the lower back flexible. Doing these exercises can help to prevent back pain or lessen existing pain.  If you have back pain or discomfort, try doing these exercises 2-3 times each day or as told by your health care provider.  As your pain improves, do them once each day, but increase the number of times that you repeat the steps for each exercise (do more repetitions).  To prevent the recurrence of back pain, continue to do these exercises once each day or as told by your health care provider. Do exercises exactly as told by your health care provider and adjust them as directed. It is normal to feel mild stretching, pulling, tightness, or discomfort as you do these exercises, but you should stop right away if you feel sudden pain or your pain gets worse. Exercises Single knee to chest Repeat these steps 3-5 times for each leg: 1. Lie on your back on a firm bed or the floor with your legs extended. 2. Bring one knee to your chest. Your other leg should stay extended and in contact with the floor. 3. Hold your knee in place by grabbing your knee or thigh with both hands and hold. 4. Pull on your knee until you feel a gentle stretch in your lower back or buttocks. 5. Hold the stretch for 10-30 seconds. 6. Slowly release and straighten your leg. Pelvic tilt Repeat these steps 5-10 times: 1. Lie on your back on a firm bed or the floor with your legs extended. 2. Bend your knees so they are pointing toward the ceiling and your feet are flat on the floor. 3. Tighten your lower abdominal muscles to press your lower back against the floor. This motion will tilt your pelvis so your tailbone points up toward the ceiling instead of pointing to your feet or the floor. 4. With gentle tension and even breathing, hold this position for 5-10 seconds. Cat-cow Repeat these steps until  your lower back becomes more flexible: 1. Get into a hands-and-knees position on a firm surface. Keep your hands under your shoulders, and keep your knees under your hips. You may place padding under your knees for comfort. 2. Let your head hang down toward your chest. Contract your abdominal muscles and point your tailbone toward the floor so your lower back becomes rounded like the back of a cat. 3. Hold this position for 5 seconds. 4. Slowly lift your head, let your abdominal muscles relax and point your tailbone up toward the ceiling so your back forms a sagging arch like the back of a cow. 5. Hold this position for 5 seconds.  Press-ups Repeat these steps 5-10 times: 1. Lie on your abdomen (face-down) on the floor. 2. Place your palms near your head, about shoulder-width apart. 3. Keeping your back as relaxed as possible and keeping your hips on the floor, slowly straighten your arms to raise the top half of your body and lift your shoulders. Do not use your back muscles to raise your upper torso. You may adjust the placement of your hands to make yourself more comfortable. 4. Hold this position for 5 seconds while you keep your back relaxed. 5. Slowly return to lying flat on the floor.  Bridges Repeat these steps 10 times: 1. Lie on your back on a firm surface. 2. Bend your knees so they are pointing toward the ceiling and   your feet are flat on the floor. Your arms should be flat at your sides, next to your body. 3. Tighten your buttocks muscles and lift your buttocks off the floor until your waist is at almost the same height as your knees. You should feel the muscles working in your buttocks and the back of your thighs. If you do not feel these muscles, slide your feet 1-2 inches farther away from your buttocks. 4. Hold this position for 3-5 seconds. 5. Slowly lower your hips to the starting position, and allow your buttocks muscles to relax completely. If this exercise is too easy, try  doing it with your arms crossed over your chest. Abdominal crunches Repeat these steps 5-10 times: 1. Lie on your back on a firm bed or the floor with your legs extended. 2. Bend your knees so they are pointing toward the ceiling and your feet are flat on the floor. 3. Cross your arms over your chest. 4. Tip your chin slightly toward your chest without bending your neck. 5. Tighten your abdominal muscles and slowly raise your trunk (torso) high enough to lift your shoulder blades a tiny bit off the floor. Avoid raising your torso higher than that because it can put too much stress on your low back and does not help to strengthen your abdominal muscles. 6. Slowly return to your starting position. Back lifts Repeat these steps 5-10 times: 1. Lie on your abdomen (face-down) with your arms at your sides, and rest your forehead on the floor. 2. Tighten the muscles in your legs and your buttocks. 3. Slowly lift your chest off the floor while you keep your hips pressed to the floor. Keep the back of your head in line with the curve in your back. Your eyes should be looking at the floor. 4. Hold this position for 3-5 seconds. 5. Slowly return to your starting position. Contact a health care provider if:  Your back pain or discomfort gets much worse when you do an exercise.  Your worsening back pain or discomfort does not lessen within 2 hours after you exercise. If you have any of these problems, stop doing these exercises right away. Do not do them again unless your health care provider says that you can. Get help right away if:  You develop sudden, severe back pain. If this happens, stop doing the exercises right away. Do not do them again unless your health care provider says that you can. This information is not intended to replace advice given to you by your health care provider. Make sure you discuss any questions you have with your health care provider. Document Revised: 04/18/2019 Document  Reviewed: 09/13/2018 Elsevier Patient Education  [place].  Neck Exercises Ask your health care provider which exercises are safe for you. Do exercises exactly as told by your health care provider and adjust them as directed. It is normal to feel mild stretching, pulling, tightness, or discomfort as you do these exercises. Stop right away if you feel sudden pain or your pain gets worse. Do not begin these exercises until told by your health care provider. Neck exercises can be important for many reasons. They can improve strength and maintain flexibility in your neck, which will help your upper back and prevent neck pain. Stretching exercises Rotation neck stretching  1. Sit in a chair or stand up. 2. Place your feet flat on the floor, shoulder width apart. 3. Slowly turn your head (rotate) to the right until a slight  stretch is felt. Turn it all the way to the right so you can look over your right shoulder. Do not tilt or tip your head. 4. Hold this position for 10-30 seconds. 5. Slowly turn your head (rotate) to the left until a slight stretch is felt. Turn it all the way to the left so you can look over your left shoulder. Do not tilt or tip your head. 6. Hold this position for 10-30 seconds. Repeat __________ times. Complete this exercise __________ times a day. Neck retraction 1. Sit in a sturdy chair or stand up. 2. Look straight ahead. Do not bend your neck. 3. Use your fingers to push your chin backward (retraction). Do not bend your neck for this movement. Continue to face straight ahead. If you are doing the exercise properly, you will feel a slight sensation in your throat and a stretch at the back of your neck. 4. Hold the stretch for 1-2 seconds. Repeat __________ times. Complete this exercise __________ times a day. Strengthening exercises Neck press 1. Lie on your back on a firm bed or on the floor with a pillow under your head. 2. Use your neck muscles to push your  head down on the pillow and straighten your spine. 3. Hold the position as well as you can. Keep your head facing up (in a neutral position) and your chin tucked. 4. Slowly count to 5 while holding this position. Repeat __________ times. Complete this exercise __________ times a day. Isometrics These are exercises in which you strengthen the muscles in your neck while keeping your neck still (isometrics). 1. Sit in a supportive chair and place your hand on your forehead. 2. Keep your head and face facing straight ahead. Do not flex or extend your neck while doing isometrics. 3. Push forward with your head and neck while pushing back with your hand. Hold for 10 seconds. 4. Do the sequence again, this time putting your hand against the back of your head. Use your head and neck to push backward against the hand pressure. 5. Finally, do the same exercise on either side of your head, pushing sideways against the pressure of your hand. Repeat __________ times. Complete this exercise __________ times a day. Prone head lifts 1. Lie face-down (prone position), resting on your elbows so that your chest and upper back are raised. 2. Start with your head facing downward, near your chest. Position your chin either on or near your chest. 3. Slowly lift your head upward. Lift until you are looking straight ahead. Then continue lifting your head as far back as you can comfortably stretch. 4. Hold your head up for 5 seconds. Then slowly lower it to your starting position. Repeat __________ times. Complete this exercise __________ times a day. Supine head lifts 1. Lie on your back (supine position), bending your knees to point to the ceiling and keeping your feet flat on the floor. 2. Lift your head slowly off the floor, raising your chin toward your chest. 3. Hold for 5 seconds. Repeat __________ times. Complete this exercise __________ times a day. Scapular retraction 1. Stand with your arms at your sides.  Look straight ahead. 2. Slowly pull both shoulders (scapulae) backward and downward (retraction) until you feel a stretch between your shoulder blades in your upper back. 3. Hold for 10-30 seconds. 4. Relax and repeat. Repeat __________ times. Complete this exercise __________ times a day. Contact a health care provider if:  Your neck pain or discomfort gets much worse  when you do an exercise.  Your neck pain or discomfort does not improve within 2 hours after you exercise. If you have any of these problems, stop exercising right away. Do not do the exercises again unless your health care provider says that you can. Get help right away if:  You develop sudden, severe neck pain. If this happens, stop exercising right away. Do not do the exercises again unless your health care provider says that you can. This information is not intended to replace advice given to you by your health care provider. Make sure you discuss any questions you have with your health care provider. Document Revised: 10/10/2018 Document Reviewed: 10/10/2018 Elsevier Patient Education  [place].

## 2020-03-10 NOTE — Assessment & Plan Note (Addendum)
Acute, ongoing.  No red flags in history or examination.  Unable to give IM Toradol in office today due to daily NSAID use.  Will start muscle relaxer.  Advised of side effects and not to drive while taking muscle relaxer.  Neck exercise handout given.  If not better in ~2 weeks, will consider PT.

## 2020-03-16 ENCOUNTER — Telehealth: Payer: Self-pay | Admitting: Nurse Practitioner

## 2020-03-16 ENCOUNTER — Telehealth: Payer: Self-pay

## 2020-03-16 DIAGNOSIS — M542 Cervicalgia: Secondary | ICD-10-CM

## 2020-03-16 DIAGNOSIS — M549 Dorsalgia, unspecified: Secondary | ICD-10-CM

## 2020-03-16 NOTE — Telephone Encounter (Signed)
Appt scheduled

## 2020-03-16 NOTE — Telephone Encounter (Signed)
Called and left a detailed message for patient, asked her to call if she wants to go to urgent care.

## 2020-03-16 NOTE — Telephone Encounter (Signed)
Pt is requesting a doctor's note that states she can work regularly and not Light duty. She wants to work without restrictions and cancel the previous note, but her job requires her to have a new note. Pt is requesting to have this uploaded to [place].

## 2020-03-16 NOTE — Telephone Encounter (Signed)
Will send in referral to [facility].  Needs appointment to discuss more detailed letter.  More than likely requires FMLA, please have her bring the forms to her appointment.

## 2020-03-16 NOTE — Telephone Encounter (Signed)
Light duty note sent to patient's Mychart.  Please let her know that full healing from the strain may take some time - exercises can definitely help in the meantime. I would be happy to refer to [employer] which should be free for her if she would like.   Copied from [place] [id number]. Topic: General - Other >> Mar 16, 2020  1:24 PM Mcneil, Ja-Kwan wrote: Reason for CRM: Pt stated she returned back to work today and at first it was fine but as she continues to work it feels just like when the back pain first began. Pt stated she needs someone to return her call because she may have to go on light duty.

## 2020-03-16 NOTE — Telephone Encounter (Signed)
Called and left a message asking patient if she wanted to go to [facility], patient called and and states that if it is free she will go to them.   Needs a more detailed letter stating what she can and can not do.  Patient is a CNA at a nursing home.   Please print and she will pick it up.

## 2020-03-16 NOTE — Telephone Encounter (Signed)
Yes and a virtual appointment would be okay.  We will discuss exactly what to put in letter.

## 2020-03-16 NOTE — Telephone Encounter (Signed)
Patient states that she just started this job and is not eligible for FMLA, does she still need an appt to come in and discuss.

## 2020-03-17 NOTE — Telephone Encounter (Signed)
Please let patient know that work note will be sent per request but I highly recommend she protect her back at work by limiting heavy lifting and at the very least start the exercises I gave her prior to returning to work.  I do not necessarily agree with her returning to full duty at work but do not want her job to suffer.

## 2020-03-17 NOTE — Telephone Encounter (Signed)
Called and left a detailed message for patient.  

## 2020-03-19 ENCOUNTER — Ambulatory Visit: Payer: Self-pay | Admitting: Nurse Practitioner

## 2020-03-19 ENCOUNTER — Telehealth: Payer: Self-pay | Admitting: Nurse Practitioner

## 2020-04-07 ENCOUNTER — Other Ambulatory Visit: Payer: Self-pay

## 2020-04-07 ENCOUNTER — Encounter: Payer: Self-pay | Admitting: Family Medicine

## 2020-04-07 ENCOUNTER — Ambulatory Visit (INDEPENDENT_AMBULATORY_CARE_PROVIDER_SITE_OTHER): Payer: PRIVATE HEALTH INSURANCE | Admitting: Family Medicine

## 2020-04-07 VITALS — BP 135/87 | HR 88 | Temp 98.4°F | Ht 64.0 in | Wt 268.6 lb

## 2020-04-07 DIAGNOSIS — Z114 Encounter for screening for human immunodeficiency virus [HIV]: Secondary | ICD-10-CM

## 2020-04-07 DIAGNOSIS — D500 Iron deficiency anemia secondary to blood loss (chronic): Secondary | ICD-10-CM | POA: Diagnosis not present

## 2020-04-07 DIAGNOSIS — R7301 Impaired fasting glucose: Secondary | ICD-10-CM

## 2020-04-07 DIAGNOSIS — M79671 Pain in right foot: Secondary | ICD-10-CM

## 2020-04-07 DIAGNOSIS — M79672 Pain in left foot: Secondary | ICD-10-CM

## 2020-04-07 DIAGNOSIS — Z13220 Encounter for screening for lipoid disorders: Secondary | ICD-10-CM | POA: Diagnosis not present

## 2020-04-07 MED ORDER — ETODOLAC 500 MG PO TABS: 500.0000 mg | ORAL_TABLET | Freq: Two times a day (BID) | ORAL | 1 refills | Status: DC

## 2020-04-07 NOTE — Patient Instructions (Signed)
Call to schedule your mammogram: [facility]  Address: [address], [place], [place] [address]  Phone: [phone]  

## 2020-04-07 NOTE — Assessment & Plan Note (Signed)
Encouraged diet and exercise with goal of losing 1-2lbs per week. Call with any concerns.  

## 2020-04-07 NOTE — Assessment & Plan Note (Signed)
Rechecking labs today. Await results. Call with any concerns.  

## 2020-04-07 NOTE — Progress Notes (Signed)
BP 135/87 (BP Location: Left Arm, Patient Position: Sitting, Cuff Size: Normal)   Pulse 88   Temp 98.4 F (36.9 C) (Oral)   Ht 5\' 4"  (1.626 m)   Wt 268 lb 9.6 oz (121.8 kg)   LMP  (LMP Unknown)   SpO2 98%   BMI 46.11 kg/m    Subjective:    Patient ID: Amanda Moran, female    DOB: [birth year], 49 y.o.   MRN: [id number]  HPI: Amanda Moran is a 49 y.o. female  Chief Complaint  Patient presents with  . Foot Pain  . Medication Refill    etodolac (LODINE) 500 MG tablet - no longer seeing provider who originally prescribed afer surgery.    ANEMIA Anemia status: controlled Etiology of anemia: iron def Compliance with treatment: excellent compliance Iron supplementation side effects: no Severity of anemia: mild Fatigue: no Decreased exercise tolerance: no  Dyspnea on exertion: no Palpitations: no Bleeding: no Pica: no  Impaired Fasting Glucose HbA1C:  Lab Results  Component Value Date   HGBA1C 5.8 03/11/2019   Duration of elevated blood sugar: chronic Polydipsia: no Polyuria: no Weight change: yes Visual disturbance: no Glucose Monitoring: no Diabetic Education: Not Completed Family history of diabetes: yes  FOOT PAIN- had surgery on her foot several months ago. Was on etodalac from him. Has not seen her podiatrist in a couple of months.  Duration: chronic Involved foot: bilateral Mechanism of injury: surgeries Onset: gradual  Severity: 6/10  Quality:  aching Frequency: with walking Radiation: no Aggravating factors: weight bearing and walking  Alleviating factors: medication   Status: stable Treatments attempted: surgery, etodalc and rest  Relief with NSAIDs?:  significant Weakness with weight bearing or walking: no Morning stiffness: yes Swelling: yes Redness: no Bruising: no Paresthesias / decreased sensation: no  Fevers:no  Relevant past medical, surgical, family and social history reviewed and updated as indicated. Interim medical history  since our last visit reviewed. Allergies and medications reviewed and updated.  Review of Systems  Constitutional: Negative.   Respiratory: Negative.   Cardiovascular: Negative.   Gastrointestinal: Negative.   Musculoskeletal: Positive for arthralgias, gait problem and myalgias. Negative for back pain, joint swelling, neck pain and neck stiffness.  Skin: Negative.   Allergic/Immunologic: Negative.   Psychiatric/Behavioral: Negative.     Per HPI unless specifically indicated above     Objective:    BP 135/87 (BP Location: Left Arm, Patient Position: Sitting, Cuff Size: Normal)   Pulse 88   Temp 98.4 F (36.9 C) (Oral)   Ht 5\' 4"  (1.626 m)   Wt 268 lb 9.6 oz (121.8 kg)   LMP  (LMP Unknown)   SpO2 98%   BMI 46.11 kg/m   Wt Readings from Last 3 Encounters:  04/07/20 268 lb 9.6 oz (121.8 kg)  03/10/20 270 lb (122.5 kg)  08/28/19 270 lb (122.5 kg)    Physical Exam Vitals and nursing note reviewed.  Constitutional:      General: She is not in acute distress.    Appearance: Normal appearance. She is not ill-appearing, toxic-appearing or diaphoretic.  HENT:     Head: Normocephalic and atraumatic.     Right Ear: External ear normal.     Left Ear: External ear normal.     Nose: Nose normal.     Mouth/Throat:     Mouth: Mucous membranes are moist.     Pharynx: Oropharynx is clear.  Eyes:     General: No scleral icterus.  Right eye: No discharge.        Left eye: No discharge.     Extraocular Movements: Extraocular movements intact.     Conjunctiva/sclera: Conjunctivae normal.     Pupils: Pupils are equal, round, and reactive to light.  Cardiovascular:     Rate and Rhythm: Normal rate and regular rhythm.     Pulses: Normal pulses.     Heart sounds: Normal heart sounds. No murmur. No friction rub. No gallop.   Pulmonary:     Effort: Pulmonary effort is normal. No respiratory distress.     Breath sounds: Normal breath sounds. No stridor. No wheezing, rhonchi or  rales.  Chest:     Chest wall: No tenderness.  Musculoskeletal:        General: Normal range of motion.     Cervical back: Normal range of motion and neck supple.  Skin:    General: Skin is warm and dry.     Capillary Refill: Capillary refill takes less than 2 seconds.     Coloration: Skin is not jaundiced or pale.     Findings: No bruising, erythema, lesion or rash.  Neurological:     General: No focal deficit present.     Mental Status: She is alert and oriented to person, place, and time. Mental status is at baseline.  Psychiatric:        Mood and Affect: Mood normal.        Behavior: Behavior normal.        Thought Content: Thought content normal.        Judgment: Judgment normal.     Results for orders placed or performed during the hospital encounter of 08/30/19  Glucose, capillary  Result Value Ref Range   Glucose-Capillary 117 (H) 70 - 99 mg/dL  Glucose, capillary  Result Value Ref Range   Glucose-Capillary 116 (H) 70 - 99 mg/dL      Assessment & Plan:   Problem List Items Addressed This Visit      Endocrine   IFG (impaired fasting glucose)    Rechecking labs today. Await results. Call with any concerns.       Relevant Orders   Bayer DCA Hb A1c Waived   Comprehensive metabolic panel   Microalbumin, Urine Waived   TSH   UA/M w/rflx Culture, Routine     Other   Iron deficiency anemia due to chronic blood loss    Rechecking labs today. Await results. Call with any concerns.       Relevant Orders   CBC with Differential/Platelet   Morbid obesity ([place])    Encouraged diet and exercise with goal of losing 1-2 lbs per week. Call with any concerns.        Other Visit Diagnoses    Bilateral foot pain    -  Primary   Will continue etodalac. Call with any concerns. Continue to monitor.    Screening for cholesterol level       Labs drawn today. Await results.    Relevant Orders   Lipid Panel w/o Chol/HDL Ratio   Screening for HIV without presence of risk  factors       Labs drawn today. Await results.    Relevant Orders   HIV Antibody (routine testing w rflx)       Follow up plan: Return in about 6 months (around 10/07/2020) for Physical.

## 2020-04-08 LAB — LIPID PANEL W/O CHOL/HDL RATIO
Cholesterol, Total: 221 mg/dL — ABNORMAL HIGH (ref 100–199)
HDL: 44 mg/dL (ref >39)
LDL Chol Calc (NIH): 156 mg/dL — ABNORMAL HIGH (ref 0–99)
Triglycerides: 114 mg/dL (ref 0–149)
VLDL Cholesterol Cal: 21 mg/dL (ref 5–40)

## 2020-04-08 LAB — CBC WITH DIFFERENTIAL/PLATELET
Basophils Absolute: 0 10*3/uL (ref 0.0–0.2)
Basos: 0 %
EOS (ABSOLUTE): 0.1 10*3/uL (ref 0.0–0.4)
Eos: 2 %
Hematocrit: 41 % (ref 34.0–46.6)
Hemoglobin: 13.6 g/dL (ref 11.1–15.9)
Immature Grans (Abs): 0 10*3/uL (ref 0.0–0.1)
Immature Granulocytes: 0 %
Lymphocytes Absolute: 2.2 10*3/uL (ref 0.7–3.1)
Lymphs: 35 %
MCH: 29.6 pg (ref 26.6–33.0)
MCHC: 33.2 g/dL (ref 31.5–35.7)
MCV: 89 fL (ref 79–97)
Monocytes Absolute: 0.6 10*3/uL (ref 0.1–0.9)
Monocytes: 9 %
Neutrophils Absolute: 3.3 10*3/uL (ref 1.4–7.0)
Neutrophils: 54 %
Platelets: 204 10*3/uL (ref 150–450)
RBC: 4.59 x10E6/uL (ref 3.77–5.28)
RDW: 13.5 % (ref 11.7–15.4)
WBC: 6.2 10*3/uL (ref 3.4–10.8)

## 2020-04-08 LAB — COMPREHENSIVE METABOLIC PANEL
ALT: 19 IU/L (ref 0–32)
AST: 19 IU/L (ref 0–40)
Albumin/Globulin Ratio: 1.6 (ref 1.2–2.2)
Albumin: 4.6 g/dL (ref 3.8–4.8)
Alkaline Phosphatase: 78 IU/L (ref 39–117)
BUN/Creatinine Ratio: 22 (ref 9–23)
BUN: 14 mg/dL (ref 6–24)
Bilirubin Total: 0.3 mg/dL (ref 0.0–1.2)
CO2: 24 mmol/L (ref 20–29)
Calcium: 9.6 mg/dL (ref 8.7–10.2)
Chloride: 104 mmol/L (ref 96–106)
Creatinine, Ser: 0.63 mg/dL (ref 0.57–1.00)
GFR calc Af Amer: 124 mL/min/{1.73_m2} (ref >59)
GFR calc non Af Amer: 107 mL/min/{1.73_m2} (ref >59)
Globulin, Total: 2.8 g/dL (ref 1.5–4.5)
Glucose: 81 mg/dL (ref 65–99)
Potassium: 3.9 mmol/L (ref 3.5–5.2)
Sodium: 141 mmol/L (ref 134–144)
Total Protein: 7.4 g/dL (ref 6.0–8.5)

## 2020-04-08 LAB — TSH: TSH: 0.968 u[IU]/mL (ref 0.450–4.500)

## 2020-04-08 LAB — HIV ANTIBODY (ROUTINE TESTING W REFLEX): HIV Screen 4th Generation wRfx: NONREACTIVE

## 2020-04-09 LAB — UA/M W/RFLX CULTURE, ROUTINE
Bilirubin, UA: POSITIVE — AB
Glucose, UA: NEGATIVE
Ketones, UA: NEGATIVE
Nitrite, UA: NEGATIVE
Protein,UA: NEGATIVE
RBC, UA: NEGATIVE
Specific Gravity, UA: 1.025 (ref 1.005–1.030)
Urobilinogen, Ur: 0.2 mg/dL (ref 0.2–1.0)
pH, UA: 5 (ref 5.0–7.5)

## 2020-04-09 LAB — URINE CULTURE, REFLEX

## 2020-04-09 LAB — MICROSCOPIC EXAMINATION: RBC, Urine: NONE SEEN /hpf (ref 0–2)

## 2020-04-09 LAB — BAYER DCA HB A1C WAIVED: HB A1C (BAYER DCA - WAIVED): 5.8 % (ref <7.0)

## 2020-04-09 LAB — MICROALBUMIN, URINE WAIVED
Creatinine, Urine Waived: 100 mg/dL (ref 10–300)
Microalb, Ur Waived: 30 mg/L — ABNORMAL HIGH (ref 0–19)
Microalb/Creat Ratio: <30 mg/g (ref <30)

## 2020-05-21 ENCOUNTER — Telehealth (INDEPENDENT_AMBULATORY_CARE_PROVIDER_SITE_OTHER): Payer: PRIVATE HEALTH INSURANCE | Admitting: Nurse Practitioner

## 2020-05-21 ENCOUNTER — Encounter: Payer: Self-pay | Admitting: Nurse Practitioner

## 2020-05-21 DIAGNOSIS — R1110 Vomiting, unspecified: Secondary | ICD-10-CM | POA: Insufficient documentation

## 2020-05-21 DIAGNOSIS — R112 Nausea with vomiting, unspecified: Secondary | ICD-10-CM

## 2020-05-21 MED ORDER — ONDANSETRON 4 MG PO TBDP: 4.0000 mg | ORAL_TABLET | Freq: Three times a day (TID) | ORAL | 0 refills | Status: DC | PRN

## 2020-05-21 NOTE — Assessment & Plan Note (Addendum)
Acute x 2 days.  Suspect gastroenteritis, Covid testing negative.  Script for Zofran sent to pharmacy, to use as needed. Recommend not returning to work until symptoms have improved, will provide work note.  Advise BLAND diet at home and ensure plenty of rest and fluids.  For worsening or ongoing symptoms recommend she return to office immediately or go to nearest UC for further evaluation if office closed.

## 2020-05-21 NOTE — Patient Instructions (Signed)
Food Choices to Help Relieve Diarrhea, Adult When you have diarrhea, the foods you eat and your eating habits are very important. Choosing the right foods and drinks can help:  Relieve diarrhea.  Replace lost fluids and nutrients.  Prevent dehydration. What general guidelines should I follow?  Relieving diarrhea  Choose foods with less than 2 g or .07 oz. of fiber per serving.  Limit fats to less than 8 tsp (38 g or 1.34 oz.) a day.  Avoid the following: ? Foods and beverages sweetened with high-fructose corn syrup, honey, or sugar alcohols such as xylitol, sorbitol, and mannitol. ? Foods that contain a lot of fat or sugar. ? Fried, greasy, or spicy foods. ? High-fiber grains, breads, and cereals. ? Raw fruits and vegetables.  Eat foods that are rich in probiotics. These foods include dairy products such as yogurt and fermented milk products. They help increase healthy bacteria in the stomach and intestines (gastrointestinal tract, or GI tract).  If you have lactose intolerance, avoid dairy products. These may make your diarrhea worse.  Take medicine to help stop diarrhea (antidiarrheal medicine) only as told by your health care provider. Replacing nutrients  Eat small meals or snacks every 3-4 hours.  Eat bland foods, such as white rice, toast, or baked potato, until your diarrhea starts to get better. Gradually reintroduce nutrient-rich foods as tolerated or as told by your health care provider. This includes: ? Well-cooked protein foods. ? Peeled, seeded, and soft-cooked fruits and vegetables. ? Low-fat dairy products.  Take vitamin and mineral supplements as told by your health care provider. Preventing dehydration  Start by sipping water or a special solution to prevent dehydration (oral rehydration solution, ORS). Urine that is clear or pale yellow means that you are getting enough fluid.  Try to drink at least 8-10 cups of fluid each day to help replace lost  fluids.  You may add other liquids in addition to water, such as clear juice or decaffeinated sports drinks, as tolerated or as told by your health care provider.  Avoid drinks with caffeine, such as coffee, tea, or soft drinks.  Avoid alcohol. What foods are recommended?     The items listed may not be a complete list. Talk with your health care provider about what dietary choices are best for you. Grains White rice. White, French, or pita breads (fresh or toasted), including plain rolls, buns, or bagels. White pasta. Saltine, soda, or graham crackers. Pretzels. Low-fiber cereal. Cooked cereals made with water (such as cornmeal, farina, or cream cereals). Plain muffins. Matzo. Melba toast. Zwieback. Vegetables Potatoes (without the skin). Most well-cooked and canned vegetables without skins or seeds. Tender lettuce. Fruits Apple sauce. Fruits canned in juice. Cooked apricots, cherries, grapefruit, peaches, pears, or plums. Fresh bananas and cantaloupe. Meats and other protein foods Baked or boiled chicken. Eggs. Tofu. Fish. Seafood. Smooth nut butters. Ground or well-cooked tender beef, ham, veal, lamb, pork, or poultry. Dairy Plain yogurt, kefir, and unsweetened liquid yogurt. Lactose-free milk, buttermilk, skim milk, or soy milk. Low-fat or nonfat hard cheese. Beverages Water. Low-calorie sports drinks. Fruit juices without pulp. Strained tomato and vegetable juices. Decaffeinated teas. Sugar-free beverages not sweetened with sugar alcohols. Oral rehydration solutions, if approved by your health care provider. Seasoning and other foods Bouillon, broth, or soups made from recommended foods. What foods are not recommended? The items listed may not be a complete list. Talk with your health care provider about what dietary choices are best for you. Grains Whole   grain, whole wheat, bran, or rye breads, rolls, pastas, and crackers. Wild or brown rice. Whole grain or bran cereals. Barley.  Oats and oatmeal. Corn tortillas or taco shells. Granola. Popcorn. Vegetables Raw vegetables. Fried vegetables. Cabbage, broccoli, Brussels sprouts, artichokes, baked beans, beet greens, corn, kale, legumes, peas, sweet potatoes, and yams. Potato skins. Cooked spinach and cabbage. Fruits Dried fruit, including raisins and dates. Raw fruits. Stewed or dried prunes. Canned fruits with syrup. Meat and other protein foods Fried or fatty meats. Deli meats. Chunky nut butters. Nuts and seeds. Beans and lentils. Bacon. Hot dogs. Sausage. Dairy High-fat cheeses. Whole milk, chocolate milk, and beverages made with milk, such as milk shakes. Half-and-half. Cream. sour cream. Ice cream. Beverages Caffeinated beverages (such as coffee, tea, soda, or energy drinks). Alcoholic beverages. Fruit juices with pulp. Prune juice. Soft drinks sweetened with high-fructose corn syrup or sugar alcohols. High-calorie sports drinks. Fats and oils Butter. Cream sauces. Margarine. Salad oils. Plain salad dressings. Olives. Avocados. Mayonnaise. Sweets and desserts Sweet rolls, doughnuts, and sweet breads. Sugar-free desserts sweetened with sugar alcohols such as xylitol and sorbitol. Seasoning and other foods Honey. Hot sauce. Chili powder. Gravy. Cream-based or milk-based soups. Pancakes and waffles. Summary  When you have diarrhea, the foods you eat and your eating habits are very important.  Make sure you get at least 8-10 cups of fluid each day, or enough to keep your urine clear or pale yellow.  Eat bland foods and gradually reintroduce healthy, nutrient-rich foods as tolerated, or as told by your health care provider.  Avoid high-fiber, fried, greasy, or spicy foods. This information is not intended to replace advice given to you by your health care provider. Make sure you discuss any questions you have with your health care provider. Document Revised: 04/04/2019 Document Reviewed: 12/09/2016 Elsevier Patient  Education  2020 [employer]  

## 2020-05-21 NOTE — Progress Notes (Signed)
LMP  (LMP Unknown)    Subjective:    Patient ID: Amanda Moran, female    DOB: 12-13-1971, 49 y.o.   MRN: [id number]  HPI: Amanda Moran is a 49 y.o. female  Chief Complaint  Patient presents with  . Emesis    pt states she has been having vomiting and diarrhea for the past 2 days. States she was tested for COVID monday and test was negative    . This visit was completed via MyChart due to the restrictions of the COVID-19 pandemic. All issues as above were discussed and addressed. Physical exam was done as above through visual confirmation on MyChart. If it was felt that the patient should be evaluated in the office, they were directed there. The patient verbally consented to this visit. . Location of the patient: home . Location of the provider: home . Those involved with this call:  . Provider: Marnee Guarneri, DNP . CMA: Yvonna Alanis, CMA . Front Desk/Registration: Don Perking  . Time spent on call: 15 minutes with patient face to face via video conference. More than 50% of this time was spent in counseling and coordination of care. 10 minutes total spent in review of patient's record and preparation of their chart.  . I verified patient identity using two factors (patient name and date of birth). Patient consents verbally to being seen via telemedicine visit today.   NAUSEA AND VOMITING Has been having nausea and vomiting for 2 days, had tested negative for Covid on Monday.  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  When it first started was on toilet all night and called out of work, now is dry heaving.  Loose stools have improved in frequency.  Went to work today and tried to "push through it", but feels dehydrated.   Duration:days Onset: sudden Severity: mild Quality: cramping Location:  diffuse  Episode duration:  Radiation: no Frequency: intermittent Alleviating factors:  Aggravating factors: Status: fluctuating Treatments  attempted: none Fever: taking Tylenol, so has not noticed Nausea: yes Vomiting: yes Weight loss: no Decreased appetite: yes Diarrhea: yes Constipation: no Blood in stool: no Heartburn: no Jaundice: no Rash: no Dysuria/urinary frequency: no Hematuria: no History of sexually transmitted disease: no Recurrent NSAID use: no  Relevant past medical, surgical, family and social history reviewed and updated as indicated. Interim medical history since our last visit reviewed. Allergies and medications reviewed and updated.  Review of Systems  Constitutional: Positive for appetite change and fatigue. Negative for activity change, chills, diaphoresis and fever.  HENT: Negative.   Respiratory: Negative.   Cardiovascular: Negative.   Gastrointestinal: Positive for abdominal pain (cramping), diarrhea, nausea and vomiting. Negative for abdominal distention, blood in stool, constipation and rectal pain.  Genitourinary: Negative.   Musculoskeletal: Positive for myalgias.  Neurological: Negative.   Psychiatric/Behavioral: Negative.     Per HPI unless specifically indicated above     Objective:    LMP  (LMP Unknown)   Wt Readings from Last 3 Encounters:  04/07/20 268 lb 9.6 oz (121.8 kg)  03/10/20 270 lb (122.5 kg)  08/28/19 270 lb (122.5 kg)    Physical Exam Vitals and nursing note reviewed.  Constitutional:      General: She is awake. She is not in acute distress.    Appearance: She is well-developed. She is not ill-appearing.  HENT:     Head: Normocephalic.     Right Ear: Hearing normal.     Left  Ear: Hearing normal.  Eyes:     General: Lids are normal.        Right eye: No discharge.        Left eye: No discharge.     Conjunctiva/sclera: Conjunctivae normal.  Pulmonary:     Effort: Pulmonary effort is normal. No accessory muscle usage or respiratory distress.  Abdominal:     Comments: Unable to auscultate due to virtual exam only.  Reports mild tenderness to abdomen with  palpation.   Musculoskeletal:     Cervical back: Normal range of motion.  Neurological:     Mental Status: She is alert and oriented to person, place, and time.  Psychiatric:        Attention and Perception: Attention normal.        Mood and Affect: Mood normal.        Behavior: Behavior normal. Behavior is cooperative.        Thought Content: Thought content normal.        Judgment: Judgment normal.     Results for orders placed or performed in visit on 04/07/20  Microscopic Examination   BLD  Result Value Ref Range   WBC, UA 6-10 (A) 0 - 5 /hpf   RBC None seen 0 - 2 /hpf   Epithelial Cells (non renal) 0-10 0 - 10 /hpf   Mucus, UA Present (A) Not Estab.   Bacteria, UA Few (A) None seen/Few  Urine Culture, Reflex   BLD  Result Value Ref Range   Urine Culture, Routine Final report    Organism ID, Bacteria Comment   Bayer DCA Hb A1c Waived  Result Value Ref Range   HB A1C (BAYER DCA - WAIVED) 5.8 <7.0 %  CBC with Differential/Platelet  Result Value Ref Range   WBC 6.2 3.4 - 10.8 x10E3/uL   RBC 4.59 3.77 - 5.28 x10E6/uL   Hemoglobin 13.6 11.1 - 15.9 g/dL   Hematocrit 41.0 34.0 - 46.6 %   MCV 89 79 - 97 fL   MCH 29.6 26.6 - 33.0 pg   MCHC 33.2 31.5 - 35.7 g/dL   RDW 13.5 11.7 - 15.4 %   Platelets 204 150 - 450 x10E3/uL   Neutrophils 54 Not Estab. %   Lymphs 35 Not Estab. %   Monocytes 9 Not Estab. %   Eos 2 Not Estab. %   Basos 0 Not Estab. %   Neutrophils Absolute 3.3 1.4 - 7.0 x10E3/uL   Lymphocytes Absolute 2.2 0.7 - 3.1 x10E3/uL   Monocytes Absolute 0.6 0.1 - 0.9 x10E3/uL   EOS (ABSOLUTE) 0.1 0.0 - 0.4 x10E3/uL   Basophils Absolute 0.0 0.0 - 0.2 x10E3/uL   Immature Granulocytes 0 Not Estab. %   Immature Grans (Abs) 0.0 0.0 - 0.1 x10E3/uL  Comprehensive metabolic panel  Result Value Ref Range   Glucose 81 65 - 99 mg/dL   BUN 14 6 - 24 mg/dL   Creatinine, Ser 0.63 0.57 - 1.00 mg/dL   GFR calc non Af Amer 107 >59 mL/min/1.73   GFR calc Af Amer 124 >59  mL/min/1.73   BUN/Creatinine Ratio 22 9 - 23   Sodium 141 134 - 144 mmol/L   Potassium 3.9 3.5 - 5.2 mmol/L   Chloride 104 96 - 106 mmol/L   CO2 24 20 - 29 mmol/L   Calcium 9.6 8.7 - 10.2 mg/dL   Total Protein 7.4 6.0 - 8.5 g/dL   Albumin 4.6 3.8 - 4.8 g/dL   Globulin, Total 2.8 1.5 -  4.5 g/dL   Albumin/Globulin Ratio 1.6 1.2 - 2.2   Bilirubin Total 0.3 0.0 - 1.2 mg/dL   Alkaline Phosphatase 78 39 - 117 IU/L   AST 19 0 - 40 IU/L   ALT 19 0 - 32 IU/L  Lipid Panel w/o Chol/HDL Ratio  Result Value Ref Range   Cholesterol, Total 221 (H) 100 - 199 mg/dL   Triglycerides 114 0 - 149 mg/dL   HDL 44 >39 mg/dL   VLDL Cholesterol Cal 21 5 - 40 mg/dL   LDL Chol Calc (NIH) 156 (H) 0 - 99 mg/dL  Microalbumin, Urine Waived  Result Value Ref Range   Microalb, Ur Waived 30 (H) 0 - 19 mg/L   Creatinine, Urine Waived 100 10 - 300 mg/dL   Microalb/Creat Ratio <30 <30 mg/g  TSH  Result Value Ref Range   TSH 0.968 0.450 - 4.500 uIU/mL  UA/M w/rflx Culture, Routine   Specimen: Blood   BLD  Result Value Ref Range   Specific Gravity, UA 1.025 1.005 - 1.030   pH, UA 5.0 5.0 - 7.5   Color, UA Yellow Yellow   Appearance Ur Cloudy (A) Clear   Leukocytes,UA 1+ (A) Negative   Protein,UA Negative Negative/Trace   Glucose, UA Negative Negative   Ketones, UA Negative Negative   RBC, UA Negative Negative   Bilirubin, UA Positive (A) Negative   Urobilinogen, Ur 0.2 0.2 - 1.0 mg/dL   Nitrite, UA Negative Negative   Microscopic Examination See below:    Urinalysis Reflex Comment   HIV Antibody (routine testing w rflx)  Result Value Ref Range   HIV Screen 4th Generation wRfx Non Reactive Non Reactive      Assessment & Plan:   Problem List Items Addressed This Visit      Digestive   Non-intractable vomiting - Primary    Acute x 2 days.  Suspect gastroenteritis, Covid testing negative.  Script for Zofran sent to pharmacy, to use as needed. Recommend not returning to work until symptoms have  improved, will provide work note.  Advise BLAND diet at home and ensure plenty of rest and fluids.  For worsening or ongoing symptoms recommend she return to office immediately or go to nearest UC for further evaluation if office closed.           I discussed the assessment and treatment plan with the patient. The patient was provided an opportunity to ask questions and all were answered. The patient agreed with the plan and demonstrated an understanding of the instructions.   The patient was advised to call back or seek an in-person evaluation if the symptoms worsen or if the condition fails to improve as anticipated.   I provided 15+ minutes of time during this encounter.  Follow up plan: Return if symptoms worsen or fail to improve.

## 2020-06-05 ENCOUNTER — Telehealth: Payer: Self-pay | Admitting: Family Medicine

## 2020-06-05 NOTE — Telephone Encounter (Signed)
Copied from [place] [id number]. Topic: General - Other >> Jun 04, 2020  2:00 PM Mcneil, Ja-Kwan wrote: Reason for CRM: Pt stated she would like to stop taking the etodolac (LODINE) 500 MG tablet and start back taking the meloxicam (MOBIC) 15 MG tablet. Pt requests Rx to be sent to [id number] - [place], [place] - [address]

## 2020-06-05 NOTE — Telephone Encounter (Signed)
Routing to provider to advise.  

## 2020-06-09 MED ORDER — MELOXICAM 15 MG PO TABS
15.0000 mg | ORAL_TABLET | Freq: Every day | ORAL | 3 refills | Status: DC
Start: 2020-06-09 — End: 2020-06-25

## 2020-06-09 NOTE — Addendum Note (Signed)
Addended by: Valerie Roys on: 06/09/2020 05:02 PM   Modules accepted: Orders

## 2020-06-15 ENCOUNTER — Other Ambulatory Visit: Payer: Self-pay | Admitting: Family Medicine

## 2020-06-15 DIAGNOSIS — Z1231 Encounter for screening mammogram for malignant neoplasm of breast: Secondary | ICD-10-CM

## 2020-06-23 ENCOUNTER — Encounter: Payer: Self-pay | Admitting: Family Medicine

## 2020-06-23 ENCOUNTER — Ambulatory Visit
Admission: RE | Admit: 2020-06-23 | Discharge: 2020-06-23 | Disposition: A | Discharge status: Home / Self Care | Payer: PRIVATE HEALTH INSURANCE | Source: Ambulatory Visit | Attending: Family Medicine | Admitting: Family Medicine

## 2020-06-23 ENCOUNTER — Other Ambulatory Visit: Payer: Self-pay

## 2020-06-23 DIAGNOSIS — Z1231 Encounter for screening mammogram for malignant neoplasm of breast: Secondary | ICD-10-CM | POA: Insufficient documentation

## 2020-06-25 MED ORDER — ETODOLAC 500 MG PO TABS: 500.0000 mg | ORAL_TABLET | Freq: Two times a day (BID) | ORAL | 1 refills | Status: DC

## 2020-07-16 ENCOUNTER — Encounter: Payer: Self-pay | Admitting: Family Medicine

## 2020-07-16 ENCOUNTER — Ambulatory Visit (INDEPENDENT_AMBULATORY_CARE_PROVIDER_SITE_OTHER): Payer: PRIVATE HEALTH INSURANCE | Admitting: Family Medicine

## 2020-07-16 ENCOUNTER — Other Ambulatory Visit: Payer: Self-pay

## 2020-07-16 VITALS — BP 116/82 | HR 97 | Temp 98.6°F | Wt 255.6 lb

## 2020-07-16 DIAGNOSIS — M25571 Pain in right ankle and joints of right foot: Secondary | ICD-10-CM

## 2020-07-16 MED ORDER — KETOROLAC TROMETHAMINE 60 MG/2ML IM SOLN
60.0000 mg | Freq: Once | INTRAMUSCULAR | Status: AC
Start: 2020-07-16 — End: 2020-07-16
  Administered 2020-07-16: 60 mg via INTRAMUSCULAR

## 2020-07-16 NOTE — Patient Instructions (Addendum)
Ankle Sprain  An ankle sprain is a stretch or tear in one of the tough tissues (ligaments) that connect the bones in your ankle. An ankle sprain can happen when the ankle rolls outward (inversion sprain) or inward (eversion sprain). What are the causes? This condition is caused by rolling or twisting the ankle. What increases the risk? You are more likely to develop this condition if you play sports. What are the signs or symptoms? Symptoms of this condition include:  Pain in your ankle.  Swelling.  Bruising. This may happen right after you sprain your ankle or 1-2 days later.  Trouble standing or walking. How is this diagnosed? This condition is diagnosed with:  A physical exam. During the exam, your doctor will press on certain parts of your foot and ankle and try to move them in certain ways.  X-ray imaging. These may be taken to see how bad the sprain is and to check for broken bones. How is this treated? This condition may be treated with:  A brace or splint. This is used to keep the ankle from moving until it heals.  An elastic bandage. This is used to support the ankle.  Crutches.  Pain medicine.  Surgery. This may be needed if the sprain is very bad.  Physical therapy. This may help to improve movement in the ankle. Follow these instructions at home: If you have a brace or a splint:  Wear the brace or splint as told by your doctor. Remove it only as told by your doctor.  Loosen the brace or splint if your toes: ? Tingle. ? Lose feeling (become numb). ? Turn cold and blue.  Keep the brace or splint clean.  If the brace or splint is not waterproof: ? Do not let it get wet. ? Cover it with a watertight covering when you take a bath or a shower. If you have an elastic bandage (dressing):  Remove it to shower or bathe.  Try not to move your ankle much, but wiggle your toes from time to time. This helps to prevent swelling.  Adjust the dressing if it feels  too tight.  Loosen the dressing if your foot: ? Loses feeling. ? Tingles. ? Becomes cold and blue. Managing pain, stiffness, and swelling   Take over-the-counter and prescription medicines only as told by doctor.  For 2-3 days, keep your ankle raised (elevated) above the level of your heart.  If told, put ice on the injured area: ? If you have a removable brace or splint, remove it as told by your doctor. ? Put ice in a plastic bag. ? Place a towel between your skin and the bag. ? Leave the ice on for 20 minutes, 2-3 times a day. General instructions  Rest your ankle.  Do not use your injured leg to support your body weight until your doctor says that you can. Use crutches as told by your doctor.  Do not use any products that contain nicotine or tobacco, such as cigarettes, e-cigarettes, and chewing tobacco. If you need help quitting, ask your doctor.  Keep all follow-up visits as told by your doctor. Contact a doctor if:  Your bruises or swelling are quickly getting worse.  Your pain does not get better after you take medicine. Get help right away if:  You cannot feel your toes or foot.  Your foot or toes look blue.  You have very bad pain that gets worse. Summary  An ankle sprain is a stretch   or tear in one of the tough tissues (ligaments) that connect the bones in your ankle.  This condition is caused by rolling or twisting the ankle.  Symptoms include pain, swelling, bruising, and trouble walking.  To help with pain and swelling, put ice on the injured ankle, raise your ankle above the level of your heart, and use an elastic bandage. Also, rest as told by your doctor.  Keep all follow-up visits as told by your doctor. This is important. This information is not intended to replace advice given to you by your health care provider. Make sure you discuss any questions you have with your health care provider. Document Revised: 05/08/2018 Document Reviewed:  05/08/2018 Elsevier Patient Education  2020 [employer]  

## 2020-07-16 NOTE — Progress Notes (Signed)
BP 116/82 (BP Location: Left Arm, Patient Position: Sitting, Cuff Size: Large)   Pulse 97   Temp 98.6 F (37 C) (Oral)   Wt (!) 255 lb 9.6 oz (115.9 kg)   LMP  (LMP Unknown)   SpO2 93%   BMI 43.87 kg/m    Subjective:    Patient ID: Amanda Moran, female    DOB: [birth year], 49 y.o.   MRN: [id number]  HPI: Amanda Moran is a 49 y.o. female  Chief Complaint  Patient presents with  . Ankle Pain   FOOT PAIN- was in a cardio class on Monday and hurt her ankle.  Duration: 5 days Involved foot: right Mechanism of injury: trauma, twisting in exercise class Location: lateral maleolus Onset: sudden  Severity: severe  Quality:  Sharp and throbbing Frequency: constant at the end of the day Radiation: no Aggravating factors: weight bearing, walking, running and movement  Alleviating factors: soaking in epsom salt and ice,    Status: worse Treatments attempted: etodalac, rest and ice  Relief with NSAIDs?:  no Weakness with weight bearing or walking: yes Morning stiffness: no Swelling: yes Redness: no Bruising: no Paresthesias / decreased sensation: no  Fevers:no  Relevant past medical, surgical, family and social history reviewed and updated as indicated. Interim medical history since our last visit reviewed. Allergies and medications reviewed and updated.  Review of Systems  Constitutional: Negative.   HENT: Negative.   Respiratory: Negative.   Cardiovascular: Negative.   Musculoskeletal: Positive for arthralgias, gait problem and myalgias. Negative for back pain, joint swelling, neck pain and neck stiffness.  Skin: Negative.     Per HPI unless specifically indicated above     Objective:    BP 116/82 (BP Location: Left Arm, Patient Position: Sitting, Cuff Size: Large)   Pulse 97   Temp 98.6 F (37 C) (Oral)   Wt (!) 255 lb 9.6 oz (115.9 kg)   LMP  (LMP Unknown)   SpO2 93%   BMI 43.87 kg/m   Wt Readings from Last 3 Encounters:  07/16/20 (!) 255 lb 9.6 oz  (115.9 kg)  04/07/20 268 lb 9.6 oz (121.8 kg)  03/10/20 270 lb (122.5 kg)    Physical Exam Vitals and nursing note reviewed.  Constitutional:      General: She is not in acute distress.    Appearance: Normal appearance. She is not ill-appearing, toxic-appearing or diaphoretic.  HENT:     Head: Normocephalic and atraumatic.     Right Ear: External ear normal.     Left Ear: External ear normal.     Nose: Nose normal.     Mouth/Throat:     Mouth: Mucous membranes are moist.     Pharynx: Oropharynx is clear.  Eyes:     General: No scleral icterus.       Right eye: No discharge.        Left eye: No discharge.     Extraocular Movements: Extraocular movements intact.     Conjunctiva/sclera: Conjunctivae normal.     Pupils: Pupils are equal, round, and reactive to light.  Cardiovascular:     Rate and Rhythm: Normal rate and regular rhythm.     Pulses: Normal pulses.     Heart sounds: Normal heart sounds. No murmur heard.  No friction rub. No gallop.   Pulmonary:     Effort: Pulmonary effort is normal. No respiratory distress.     Breath sounds: Normal breath sounds. No stridor. No wheezing, rhonchi or rales.  Chest:     Chest wall: No tenderness.  Musculoskeletal:        General: Swelling and tenderness present.     Cervical back: Normal range of motion and neck supple.     Comments: Tenderness over lateral maleolus  Skin:    General: Skin is warm and dry.     Capillary Refill: Capillary refill takes less than 2 seconds.     Coloration: Skin is not jaundiced or pale.     Findings: No bruising, erythema, lesion or rash.  Neurological:     General: No focal deficit present.     Mental Status: She is alert and oriented to person, place, and time. Mental status is at baseline.  Psychiatric:        Mood and Affect: Mood normal.        Behavior: Behavior normal.        Thought Content: Thought content normal.        Judgment: Judgment normal.     Results for orders placed or  performed in visit on 04/07/20  Microscopic Examination   BLD  Result Value Ref Range   WBC, UA 6-10 (A) 0 - 5 /hpf   RBC None seen 0 - 2 /hpf   Epithelial Cells (non renal) 0-10 0 - 10 /hpf   Mucus, UA Present (A) Not Estab.   Bacteria, UA Few (A) None seen/Few  Urine Culture, Reflex   BLD  Result Value Ref Range   Urine Culture, Routine Final report    Organism ID, Bacteria Comment   Bayer DCA Hb A1c Waived  Result Value Ref Range   HB A1C (BAYER DCA - WAIVED) 5.8 <7.0 %  CBC with Differential/Platelet  Result Value Ref Range   WBC 6.2 3.4 - 10.8 x10E3/uL   RBC 4.59 3.77 - 5.28 x10E6/uL   Hemoglobin 13.6 11.1 - 15.9 g/dL   Hematocrit 41.0 34.0 - 46.6 %   MCV 89 79 - 97 fL   MCH 29.6 26.6 - 33.0 pg   MCHC 33.2 31 - 35 g/dL   RDW 13.5 11.7 - 15.4 %   Platelets 204 150 - 450 x10E3/uL   Neutrophils 54 Not Estab. %   Lymphs 35 Not Estab. %   Monocytes 9 Not Estab. %   Eos 2 Not Estab. %   Basos 0 Not Estab. %   Neutrophils Absolute 3.3 1 - 7 x10E3/uL   Lymphocytes Absolute 2.2 0 - 3 x10E3/uL   Monocytes Absolute 0.6 0 - 0 x10E3/uL   EOS (ABSOLUTE) 0.1 0.0 - 0.4 x10E3/uL   Basophils Absolute 0.0 0 - 0 x10E3/uL   Immature Granulocytes 0 Not Estab. %   Immature Grans (Abs) 0.0 0.0 - 0.1 x10E3/uL  Comprehensive metabolic panel  Result Value Ref Range   Glucose 81 65 - 99 mg/dL   BUN 14 6 - 24 mg/dL   Creatinine, Ser 0.63 0.57 - 1.00 mg/dL   GFR calc non Af Amer 107 >59 mL/min/1.73   GFR calc Af Amer 124 >59 mL/min/1.73   BUN/Creatinine Ratio 22 9 - 23   Sodium 141 134 - 144 mmol/L   Potassium 3.9 3.5 - 5.2 mmol/L   Chloride 104 96 - 106 mmol/L   CO2 24 20 - 29 mmol/L   Calcium 9.6 8.7 - 10.2 mg/dL   Total Protein 7.4 6.0 - 8.5 g/dL   Albumin 4.6 3.8 - 4.8 g/dL   Globulin, Total 2.8 1.5 - 4.5 g/dL   Albumin/Globulin Ratio  1.6 1.2 - 2.2   Bilirubin Total 0.3 0.0 - 1.2 mg/dL   Alkaline Phosphatase 78 39 - 117 IU/L   AST 19 0 - 40 IU/L   ALT 19 0 - 32 IU/L  Lipid  Panel w/o Chol/HDL Ratio  Result Value Ref Range   Cholesterol, Total 221 (H) 100 - 199 mg/dL   Triglycerides 114 0 - 149 mg/dL   HDL 44 >39 mg/dL   VLDL Cholesterol Cal 21 5 - 40 mg/dL   LDL Chol Calc (NIH) 156 (H) 0 - 99 mg/dL  Microalbumin, Urine Waived  Result Value Ref Range   Microalb, Ur Waived 30 (H) 0 - 19 mg/L   Creatinine, Urine Waived 100 10 - 300 mg/dL   Microalb/Creat Ratio <30 <30 mg/g  TSH  Result Value Ref Range   TSH 0.968 0.450 - 4.500 uIU/mL  UA/M w/rflx Culture, Routine   Specimen: Blood   BLD  Result Value Ref Range   Specific Gravity, UA 1.025 1.005 - 1.030   pH, UA 5.0 5.0 - 7.5   Color, UA Yellow Yellow   Appearance Ur Cloudy (A) Clear   Leukocytes,UA 1+ (A) Negative   Protein,UA Negative Negative/Trace   Glucose, UA Negative Negative   Ketones, UA Negative Negative   RBC, UA Negative Negative   Bilirubin, UA Positive (A) Negative   Urobilinogen, Ur 0.2 0.2 - 1.0 mg/dL   Nitrite, UA Negative Negative   Microscopic Examination See below:    Urinalysis Reflex Comment   HIV Antibody (routine testing w rflx)  Result Value Ref Range   HIV Screen 4th Generation wRfx Non Reactive Non Reactive      Assessment & Plan:   Problem List Items Addressed This Visit    None    Visit Diagnoses    Acute right ankle pain    -  Primary   RICE. Offered out of work, but she declined. Toradol shot today. Wear ACE-bandage. Follow up with podiatry on Monday. Call with any concerns.    Relevant Medications   ketorolac (TORADOL) injection 60 mg (Completed)   Other Relevant Orders   DG Ankle Complete Right       Follow up plan: Return if symptoms worsen or fail to improve.

## 2020-08-03 ENCOUNTER — Encounter: Payer: Self-pay | Admitting: Family Medicine

## 2020-08-03 ENCOUNTER — Telehealth: Payer: Self-pay | Admitting: Family Medicine

## 2020-08-03 NOTE — Telephone Encounter (Signed)
Medication Refill - Medication: Phentermine (pt states that she cannot walk on her ankles well, she want this to curve her appetite and help her lose weight)  Has the patient contacted their pharmacy? Yes.   (Agent: If no, request that the patient contact the pharmacy for the refill.) (Agent: If yes, when and what did the pharmacy advise?)  Preferred Pharmacy (with phone number or street name):  [facility] - [place], [place] [address]  Phone: [phone] Fax: [phone]     Agent: Please be advised that RX refills may take up to 3 business days. We ask that you follow-up with your pharmacy.

## 2020-08-03 NOTE — Telephone Encounter (Signed)
Pt. Was seen in office 07/16/20 for right ankle pain.  Is requesting medication for weight loss.  Will forward to the PCP for review/ recommendation.

## 2020-08-03 NOTE — Telephone Encounter (Signed)
See mychart message. Patient was advised that we do not prescribe that medication. Awaiting response to see if patient would like an appointment to be seen to discuss other options.

## 2020-08-06 ENCOUNTER — Encounter: Payer: Self-pay | Admitting: Family Medicine

## 2020-08-06 ENCOUNTER — Telehealth (INDEPENDENT_AMBULATORY_CARE_PROVIDER_SITE_OTHER): Payer: PRIVATE HEALTH INSURANCE | Admitting: Family Medicine

## 2020-08-06 NOTE — Progress Notes (Signed)
BP (!) 146/74   Pulse 99   Temp (!) 97.5 F (36.4 C) (Oral)   Wt 252 lb (114.3 kg)   LMP  (LMP Unknown)   BMI 43.26 kg/m    Subjective:    Patient ID: Amanda Moran, female    DOB: [birth year], 49 y.o.   MRN: [id number]  HPI: [name] is a 49 y.o. female  Chief Complaint  Patient presents with  . Weight Loss    pt states she wants to discuss medication for weight loss. States she currently goes to the gym 3 to 4 times per week. No previous weight loss medications. States she has always been able to loose weight in the past but cannot now    WEIGHT GAIN Duration: chronic Previous attempts at weight loss: yes- detox, gummies, going to the gym 3x a week Complications of obesity: IFG, back pain Peak weight: 270 Weight loss goal: 200 Weight loss to date: 18lbs  Requesting obesity pharmacotherapy: yes Current weight loss supplements/medications: no Previous weight loss supplements/meds: no  Relevant past medical, surgical, family and social history reviewed and updated as indicated. Interim medical history since our last visit reviewed. Allergies and medications reviewed and updated.  Review of Systems  Constitutional: Negative.   Respiratory: Negative.   Cardiovascular: Negative.   Gastrointestinal: Negative.   Musculoskeletal: Negative.   Psychiatric/Behavioral: Negative.     Per HPI unless specifically indicated above     Objective:    BP (!) 146/74   Pulse 99   Temp (!) 97.5 F (36.4 C) (Oral)   Wt 252 lb (114.3 kg)   LMP  (LMP Unknown)   BMI 43.26 kg/m   Wt Readings from Last 3 Encounters:  08/06/20 252 lb (114.3 kg)  07/16/20 (!) 255 lb 9.6 oz (115.9 kg)  04/07/20 268 lb 9.6 oz (121.8 kg)    Physical Exam Vitals and nursing note reviewed.  Constitutional:      General: She is not in acute distress.    Appearance: Normal appearance. She is not ill-appearing, toxic-appearing or diaphoretic.  HENT:     Head: Normocephalic and atraumatic.      Right Ear: External ear normal.     Left Ear: External ear normal.     Nose: Nose normal.     Mouth/Throat:     Mouth: Mucous membranes are moist.     Pharynx: Oropharynx is clear.  Eyes:     General: No scleral icterus.       Right eye: No discharge.        Left eye: No discharge.     Conjunctiva/sclera: Conjunctivae normal.     Pupils: Pupils are equal, round, and reactive to light.  Pulmonary:     Effort: Pulmonary effort is normal. No respiratory distress.     Comments: Speaking in full sentences Musculoskeletal:        General: Normal range of motion.     Cervical back: Normal range of motion.  Skin:    Coloration: Skin is not jaundiced or pale.     Findings: No bruising, erythema, lesion or rash.  Neurological:     Mental Status: She is alert and oriented to person, place, and time. Mental status is at baseline.  Psychiatric:        Mood and Affect: Mood normal.        Behavior: Behavior normal.        Thought Content: Thought content normal.  Judgment: Judgment normal.     Results for orders placed or performed in visit on 04/07/20  Microscopic Examination   BLD  Result Value Ref Range   WBC, UA 6-10 (A) 0 - 5 /hpf   RBC None seen 0 - 2 /hpf   Epithelial Cells (non renal) 0-10 0 - 10 /hpf   Mucus, UA Present (A) Not Estab.   Bacteria, UA Few (A) None seen/Few  Urine Culture, Reflex   BLD  Result Value Ref Range   Urine Culture, Routine Final report    Organism ID, Bacteria Comment   Bayer DCA Hb A1c Waived  Result Value Ref Range   HB A1C (BAYER DCA - WAIVED) 5.8 <7.0 %  CBC with Differential/Platelet  Result Value Ref Range   WBC 6.2 3.4 - 10.8 x10E3/uL   RBC 4.59 3.77 - 5.28 x10E6/uL   Hemoglobin 13.6 11.1 - 15.9 g/dL   Hematocrit 41.0 34.0 - 46.6 %   MCV 89 79 - 97 fL   MCH 29.6 26.6 - 33.0 pg   MCHC 33.2 31 - 35 g/dL   RDW 13.5 11.7 - 15.4 %   Platelets 204 150 - 450 x10E3/uL   Neutrophils 54 Not Estab. %   Lymphs 35 Not Estab. %    Monocytes 9 Not Estab. %   Eos 2 Not Estab. %   Basos 0 Not Estab. %   Neutrophils Absolute 3.3 1 - 7 x10E3/uL   Lymphocytes Absolute 2.2 0 - 3 x10E3/uL   Monocytes Absolute 0.6 0 - 0 x10E3/uL   EOS (ABSOLUTE) 0.1 0.0 - 0.4 x10E3/uL   Basophils Absolute 0.0 0 - 0 x10E3/uL   Immature Granulocytes 0 Not Estab. %   Immature Grans (Abs) 0.0 0.0 - 0.1 x10E3/uL  Comprehensive metabolic panel  Result Value Ref Range   Glucose 81 65 - 99 mg/dL   BUN 14 6 - 24 mg/dL   Creatinine, Ser 0.63 0.57 - 1.00 mg/dL   GFR calc non Af Amer 107 >59 mL/min/1.73   GFR calc Af Amer 124 >59 mL/min/1.73   BUN/Creatinine Ratio 22 9 - 23   Sodium 141 134 - 144 mmol/L   Potassium 3.9 3.5 - 5.2 mmol/L   Chloride 104 96 - 106 mmol/L   CO2 24 20 - 29 mmol/L   Calcium 9.6 8.7 - 10.2 mg/dL   Total Protein 7.4 6.0 - 8.5 g/dL   Albumin 4.6 3.8 - 4.8 g/dL   Globulin, Total 2.8 1.5 - 4.5 g/dL   Albumin/Globulin Ratio 1.6 1.2 - 2.2   Bilirubin Total 0.3 0.0 - 1.2 mg/dL   Alkaline Phosphatase 78 39 - 117 IU/L   AST 19 0 - 40 IU/L   ALT 19 0 - 32 IU/L  Lipid Panel w/o Chol/HDL Ratio  Result Value Ref Range   Cholesterol, Total 221 (H) 100 - 199 mg/dL   Triglycerides 114 0 - 149 mg/dL   HDL 44 >39 mg/dL   VLDL Cholesterol Cal 21 5 - 40 mg/dL   LDL Chol Calc (NIH) 156 (H) 0 - 99 mg/dL  Microalbumin, Urine Waived  Result Value Ref Range   Microalb, Ur Waived 30 (H) 0 - 19 mg/L   Creatinine, Urine Waived 100 10 - 300 mg/dL   Microalb/Creat Ratio <30 <30 mg/g  TSH  Result Value Ref Range   TSH 0.968 0.450 - 4.500 uIU/mL  UA/M w/rflx Culture, Routine   Specimen: Blood   BLD  Result Value Ref Range  Specific Gravity, UA 1.025 1.005 - 1.030   pH, UA 5.0 5.0 - 7.5   Color, UA Yellow Yellow   Appearance Ur Cloudy (A) Clear   Leukocytes,UA 1+ (A) Negative   Protein,UA Negative Negative/Trace   Glucose, UA Negative Negative   Ketones, UA Negative Negative   RBC, UA Negative Negative   Bilirubin, UA Positive  (A) Negative   Urobilinogen, Ur 0.2 0.2 - 1.0 mg/dL   Nitrite, UA Negative Negative   Microscopic Examination See below:    Urinalysis Reflex Comment   HIV Antibody (routine testing w rflx)  Result Value Ref Range   HIV Screen 4th Generation wRfx Non Reactive Non Reactive      Assessment & Plan:   Problem List Items Addressed This Visit      Other   Morbid obesity ([place]) - Primary    Will consider saxenda vs contrave and check with her insurance. Continue diet and exercise with goal of losing 1-2lbs per week. Congratulated patient on 18lb weight loss. Call with any concerns.           Follow up plan: Return in about 4 weeks (around 09/03/2020) for follow up med.   . This visit was completed via MyChart due to the restrictions of the COVID-19 pandemic. All issues as above were discussed and addressed. Physical exam was done as above through visual confirmation on MyChart. If it was felt that the patient should be evaluated in the office, they were directed there. The patient verbally consented to this visit. . Location of the patient: parking lot . Location of the provider: work . Those involved with this call:  . Provider: Park Liter, DO . CMA: Yvonna Alanis, [place] . Front Desk/Registration: Don Perking  . Time spent on call: 15 minutes with patient face to face via video conference. More than 50% of this time was spent in counseling and coordination of care. 23 minutes total spent in review of patient's record and preparation of their chart.

## 2020-08-06 NOTE — Patient Instructions (Addendum)
Liraglutide injection (Weight Management) What is this medicine? LIRAGLUTIDE (LIR a GLOO tide) is used to help people lose weight and maintain weight loss. It is used with a reduced-calorie diet and exercise. This medicine may be used for other purposes; ask your health care provider or pharmacist if you have questions. COMMON BRAND NAME(S): Saxenda What should I tell my health care provider before I take this medicine? They need to know if you have any of these conditions:  endocrine tumors (MEN 2) or if someone in your family had these tumors  gallbladder disease  high cholesterol  history of alcohol abuse problem  history of pancreatitis  kidney disease or if you are on dialysis  liver disease  previous swelling of the tongue, face, or lips with difficulty breathing, difficulty swallowing, hoarseness, or tightening of the throat  stomach problems  suicidal thoughts, plans, or attempt; a previous suicide attempt by you or a family member  thyroid cancer or if someone in your family had thyroid cancer  an unusual or allergic reaction to liraglutide, other medicines, foods, dyes, or preservatives  pregnant or trying to get pregnant  breast-feeding How should I use this medicine? This medicine is for injection under the skin of your upper leg, stomach area, or upper arm. You will be taught how to prepare and give this medicine. Use exactly as directed. Take your medicine at regular intervals. Do not take it more often than directed. This drug comes with INSTRUCTIONS FOR USE. Ask your pharmacist for directions on how to use this drug. Read the information carefully. Talk to your pharmacist or health care provider if you have questions. It is important that you put your used needles and syringes in a special sharps container. Do not put them in a trash can. If you do not have a sharps container, call your pharmacist or healthcare provider to get one. A special MedGuide will be  given to you by the pharmacist with each prescription and refill. Be sure to read this information carefully each time. Talk to your pediatrician regarding the use of this medicine in children. Special care may be needed. Overdosage: If you think you have taken too much of this medicine contact a poison control center or emergency room at once. NOTE: This medicine is only for you. Do not share this medicine with others. What if I miss a dose? If you miss a dose, take it as soon as you can. If it is almost time for your next dose, take only that dose. Do not take double or extra doses. If you miss your dose for 3 days or more, call your doctor or health care professional to talk about how to restart this medicine. What may interact with this medicine?  insulin and other medicines for diabetes This list may not describe all possible interactions. Give your health care provider a list of all the medicines, herbs, non-prescription drugs, or dietary supplements you use. Also tell them if you smoke, drink alcohol, or use illegal drugs. Some items may interact with your medicine. What should I watch for while using this medicine? Visit your doctor or health care professional for regular checks on your progress. Drink plenty of fluids while taking this medicine. Check with your doctor or health care professional if you get an attack of severe diarrhea, nausea, and vomiting. The loss of too much body fluid can make it dangerous for you to take this medicine. This medicine may affect blood sugar levels. Ask your healthcare  provider if changes in diet or medicines are needed if you have diabetes. Patients and their families should watch out for worsening depression or thoughts of suicide. Also watch out for sudden changes in feelings such as feeling anxious, agitated, panicky, irritable, hostile, aggressive, impulsive, severely restless, overly excited and hyperactive, or not being able to sleep. If this happens,  especially at the beginning of treatment or after a change in dose, call your health care professional. Women should inform their health care provider if they wish to become pregnant or think they might be pregnant. Losing weight while pregnant is not advised and may cause harm to the unborn child. Talk to your health care provider for more information. What side effects may I notice from receiving this medicine? Side effects that you should report to your doctor or health care professional as soon as possible:  allergic reactions like skin rash, itching or hives, swelling of the face, lips, or tongue  breathing problems  diarrhea that continues or is severe  lump or swelling on the neck  severe nausea  signs and symptoms of infection like fever or chills; cough; sore throat; pain or trouble passing urine  signs and symptoms of low blood sugar such as feeling anxious; confusion; dizziness; increased hunger; unusually weak or tired; increased sweating; shakiness; cold, clammy skin; irritable; headache; blurred vision; fast heartbeat; loss of consciousness  signs and symptoms of kidney injury like trouble passing urine or change in the amount of urine  trouble swallowing  unusual stomach upset or pain  vomiting Side effects that usually do not require medical attention (report to your doctor or health care professional if they continue or are bothersome):  constipation  decreased appetite  diarrhea  fatigue  headache  nausea  pain, redness, or irritation at site where injected  stomach upset  stuffy or runny nose This list may not describe all possible side effects. Call your doctor for medical advice about side effects. You may report side effects to FDA at 1-800-FDA-1088. Where should I keep my medicine? Keep out of the reach of children. Store unopened pen in a refrigerator between 2 and 8 degrees C (36 and 46 degrees F). Do not freeze or use if the medicine has been  frozen. Protect from light and excessive heat. After you first use the pen, it can be stored at room temperature between 15 and 30 degrees C (59 and 86 degrees F) or in a refrigerator. Throw away your used pen after 30 days or after the expiration date, whichever comes first. Do not store your pen with the needle attached. If the needle is left on, medicine may leak from the pen. NOTE: This sheet is a summary. It may not cover all possible information. If you have questions about this medicine, talk to your doctor, pharmacist, or health care provider.  2020 Elsevier/Gold Standard (2019-10-17 21:16:59)  Bupropion; Naltrexone extended-release tablets What is this medicine? BUPROPION; NALTREXONE (byoo PROE pee on; nal TREX one) is a combination of two drugs that help you lose weight. This product is used with a reduced calorie diet and exercise. This product can also help you maintain weight loss. This medicine may be used for other purposes; ask your health care provider or pharmacist if you have questions. COMMON BRAND NAME(S): Contrave What should I tell my health care provider before I take this medicine? They need to know if you have any of these conditions:  an eating disorder, such as anorexia or bulimia  diabetes  depression  glaucoma  head injury  heart disease  high blood pressure  history of drug abuse or alcohol abuse problem  history of a tumor or infection of your brain or spine  history of heart attack or stroke  history of irregular heartbeat  if you often drink alcohol  kidney disease  liver disease  low levels of sodium in the blood  mental illness  seizures  suicidal thoughts, plans, or attempt; a previous suicide attempt by you or a family member  taken an MAOI like Carbex, Eldepryl, Marplan, Nardil, or Parnate in last 14 days  an unusual or allergic reaction to bupropion, naltrexone, other medicines, foods, dyes, or  preservatives  breast-feeding  pregnant or trying to become pregnant How should I use this medicine? Take this medicine by mouth with a glass of water. Follow the directions on the prescription label. Do not cut, crush or chew this medicine. Swallow the tablets whole. You can take it with or without food. Do not take with high-fat meals as this may increase your risk of seizures. Take your medicine at regular intervals. Do not take it more often than directed. Do not stop taking except on your doctor's advice. A special MedGuide will be given to you by the pharmacist with each prescription and refill. Be sure to read this information carefully each time. Talk to your pediatrician regarding the use of this medicine in children. Special care may be needed. Overdosage: If you think you have taken too much of this medicine contact a poison control center or emergency room at once. NOTE: This medicine is only for you. Do not share this medicine with others. What if I miss a dose? If you miss a dose, skip it. Take your next dose at the normal time. Do not take extra or 2 doses at the same time to make up for the missed dose. What may interact with this medicine? Do not take this medicine with any of the following medications:  any medicines used to stop taking opioids such as methadone or buprenorphine  linezolid  MAOIs like Carbex, Eldepryl, Marplan, Nardil, and Parnate  methylene blue (injected into a vein)  often take narcotic medicines for pain or cough  other medicines that contain bupropion like Zyban or Wellbutrin This medicine may also interact with the following medications:  alcohol  certain medicines for blood pressure like metoprolol, propranolol  certain medicines for depression, anxiety, or psychotic disturbances  certain medicines for HIV or hepatitis  certain medicines for irregular heart beat like propafenone, flecainide  certain medicines for Parkinson's disease  like amantadine, levodopa  certain medicines for seizures like carbamazepine, phenytoin, phenobarbital  certain medicines for sleep  cimetidine  clopidogrel  cyclophosphamide  digoxin  disulfiram  furazolidone  isoniazid  nicotine  orphenadrine  procarbazine  steroid medicines like prednisone or cortisone  stimulant medicines for attention disorders, weight loss, or to stay awake  tamoxifen  theophylline  thiotepa  ticlopidine  tramadol  warfarin This list may not describe all possible interactions. Give your health care provider a list of all the medicines, herbs, non-prescription drugs, or dietary supplements you use. Also tell them if you smoke, drink alcohol, or use illegal drugs. Some items may interact with your medicine. What should I watch for while using this medicine? Visit your doctor or healthcare provider for regular checks on your progress. This medicine may cause serious skin reactions. They can happen weeks to months after starting the medicine. Contact your  healthcare provider right away if you notice fevers or flu-like symptoms with a rash. The rash may be red or purple and then turn into blisters or peeling of the skin. Or, you might notice a red rash with swelling of the face, lips or lymph nodes in your neck or under your arms. This medicine may affect blood sugar. Ask your healthcare provider if changes in diet or medicines are needed if you have diabetes. Patients and their families should watch out for new or worsening depression or thoughts of suicide. Also watch out for sudden changes in feelings such as feeling anxious, agitated, panicky, irritable, hostile, aggressive, impulsive, severely restless, overly excited and hyperactive, or not being able to sleep. If this happens, especially at the beginning of treatment or after a change in dose, call your healthcare provider. Avoid alcoholic drinks while taking this medicine. Drinking large  amounts of alcoholic beverages, using sleeping or anxiety medicines, or quickly stopping the use of these agents while taking this medicine may increase your risk for a seizure. Do not drive or use heavy machinery until you know how this medicine affects you. This medicine can impair your ability to perform these tasks. Women should inform their health care provider if they wish to become pregnant or think they might be pregnant. Losing weight while pregnant is not advised and may cause harm to the unborn child. Talk to your health care provider for more information. What side effects may I notice from receiving this medicine? Side effects that you should report to your doctor or health care professional as soon as possible:  allergic reactions like skin rash, itching or hives, swelling of the face, lips, or tongue  breathing problems  changes in vision  confusion  elevated mood, decreased need for sleep, racing thoughts, impulsive behavior  fast or irregular heartbeat  hallucinations, loss of contact with reality  increased blood pressure  rash, fever, and swollen lymph nodes  redness, blistering, peeling, or loosening of the skin, including inside the mouth  seizures  signs and symptoms of liver injury like dark yellow or brown urine; general ill feeling or flu-like symptoms; light-colored stools; loss of appetite; nausea; right upper belly pain; unusually weak or tired; yellowing of the eyes or skin  suicidal thoughts or other mood changes  vomiting Side effects that usually do not require medical attention (report to your doctor or health care professional if they continue or are bothersome):  constipation  headache  loss of appetite  indigestion, stomach upset  tremors This list may not describe all possible side effects. Call your doctor for medical advice about side effects. You may report side effects to FDA at 1-800-FDA-1088. Where should I keep my  medicine? Keep out of the reach of children. Store at room temperature between 15 and 30 degrees C (59 and 86 degrees F). Throw away any unused medicine after the expiration date. NOTE: This sheet is a summary. It may not cover all possible information. If you have questions about this medicine, talk to your doctor, pharmacist, or health care provider.  2020 Elsevier/Gold Standard (2019-10-18 03:00:92)  Thoracic Strain Rehab Ask your health care provider which exercises are safe for you. Do exercises exactly as told by your health care provider and adjust them as directed. It is normal to feel mild stretching, pulling, tightness, or discomfort as you do these exercises. Stop right away if you feel sudden pain or your pain gets worse. Do not begin these exercises until told by  your health care provider. Stretching and range-of-motion exercise This exercise warms up your muscles and joints and improves the movement and flexibility of your back and shoulders. This exercise also helps to relieve pain. Chest and spine stretch  1. Lie down on your back on a firm surface. 2. Roll a towel or a small blanket so it is about 4 inches (10 cm) in diameter. 3. Put the towel lengthwise under the middle of your back so it is under your spine, but not under your shoulder blades. 4. Put your hands behind your head and let your elbows fall to your sides. This will increase your stretch. 5. Take a deep breath (inhale). 6. Hold for __________ seconds. 7. Relax after you breathe out (exhale). Repeat __________ times. Complete this exercise __________ times a day. Strengthening exercises These exercises build strength and endurance in your back and your shoulder blade muscles. Endurance is the ability to use your muscles for a long time, even after they get tired. Alternating arm and leg raises  1. Get on your hands and knees on a firm surface. If you are on a hard floor, you may want to use padding, such as an  exercise mat, to cushion your knees. 2. Line up your arms and legs. Your hands should be directly below your shoulders, and your knees should be directly below your hips. 3. Lift your left leg behind you. At the same time, raise your right arm and straighten it in front of you. ? Do not lift your leg higher than your hip. ? Do not lift your arm higher than your shoulder. ? Keep your abdominal and back muscles tight. ? Keep your hips facing the ground. ? Do not arch your back. ? Keep your balance carefully, and do not hold your breath. 4. Hold for __________ seconds. 5. Slowly return to the starting position and repeat with your right leg and your left arm. Repeat __________ times. Complete this exercise __________ times a day. Straight arm rows This exercise is also called shoulder extension exercise. 1. Stand with your feet shoulder width apart. 2. Secure an exercise band to a stable object in front of you so the band is at or above shoulder height. 3. Hold one end of the exercise band in each hand. 4. Straighten your elbows and lift your hands up to shoulder height. 5. Step back, away from the secured end of the exercise band, until the band stretches. 6. Squeeze your shoulder blades together and pull your hands down to the sides of your thighs. Stop when your hands are straight down by your sides. This is shoulder extension. Do not let your hands go behind your body. 7. Hold for __________ seconds. 8. Slowly return to the starting position. Repeat __________ times. Complete this exercise __________ times a day. Prone shoulder external rotation 1. Lie on your abdomen on a firm bed so your left / right forearm hangs over the edge of the bed and your upper arm is on the bed, straight out from your body. This is the prone position. ? Your elbow should be bent. ? Your palm should be facing your feet. 2. If instructed, hold a __________ weight in your hand. 3. Squeeze your shoulder blade  toward the middle of your back. Do not let your shoulder lift toward your ear. 4. Keep your elbow bent in a 90-degree angle (right angle) while you slowly move your forearm up toward the ceiling. Move your forearm up to the height  of the bed, toward your head. This is external rotation. ? Your upper arm should not move. ? At the top of the movement, your palm should face the floor. 5. Hold for __________ seconds. 6. Slowly return to the starting position and relax your muscles. Repeat __________ times. Complete this exercise __________ times a day. Rowing scapular retraction This is an exercise in which the shoulder blades (scapulae) are pulled toward each other (retraction). 1. Sit in a stable chair without armrests, or stand up. 2. Secure an exercise band to a stable object in front of you so the band is at shoulder height. 3. Hold one end of the exercise band in each hand. Your palms should face down. 4. Bring your arms out straight in front of you. 5. Step back, away from the secured end of the exercise band, until the band stretches. 6. Pull the band backward. As you do this, bend your elbows and squeeze your shoulder blades together, but avoid letting the rest of your body move. Do not shrug your shoulders upward while you do this. 7. Stop when your elbows are at your sides or slightly behind your body. 8. Hold for __________ seconds. 9. Slowly straighten your arms to return to the starting position. Repeat __________ times. Complete this exercise __________ times a day. Posture and body mechanics Good posture and healthy body mechanics can help to relieve stress in your body's tissues and joints. Body mechanics refers to the movements and positions of your body while you do your daily activities. Posture is part of body mechanics. Good posture means:  Your spine is in its natural S-curve position (neutral).  Your shoulders are pulled back slightly.  Your head is not tipped  forward. Follow these guidelines to improve your posture and body mechanics in your everyday activities. Standing   When standing, keep your spine neutral and your feet about hip width apart. Keep a slight bend in your knees. Your ears, shoulders, and hips should line up with each other.  When you do a task in which you lean forward while standing in one place for a long time, place one foot up on a stable object that is 2-4 inches (5-10 cm) high, such as a footstool. This helps keep your spine neutral. Sitting   When sitting, keep your spine neutral and keep your feet flat on the floor. Use a footrest, if necessary, and keep your thighs parallel to the floor. Avoid rounding your shoulders, and avoid tilting your head forward.  When working at a desk or a computer, keep your desk at a height where your hands are slightly lower than your elbows. Slide your chair under your desk so you are close enough to maintain good posture.  When working at a computer, place your monitor at a height where you are looking straight ahead and you do not have to tilt your head forward or downward to look at the screen. Resting When lying down and resting, avoid positions that are most painful for you.  If you have pain with activities such as sitting, bending, stooping, or squatting (flexion-basedactivities), lie in a position in which your body does not bend very much. For example, avoid curling up on your side with your arms and knees near your chest (fetal position).  If you have pain with activities such as standing for a long time or reaching with your arms (extension-basedactivities), lie with your spine in a neutral position and bend your knees slightly. Try the following  positions: ? Lie on your side with a pillow between your knees. ? Lie on your back with a pillow under your knees.  Lifting   When lifting objects, keep your feet at least shoulder width apart and tighten your abdominal  muscles.  Bend your knees and hips and keep your spine neutral. It is important to lift using the strength of your legs, not your back. Do not lock your knees straight out.  Always ask for help to lift heavy or awkward objects. This information is not intended to replace advice given to you by your health care provider. Make sure you discuss any questions you have with your health care provider. Document Revised: 04/05/2019 Document Reviewed: 01/21/2019 Elsevier Patient Education  [employer].

## 2020-08-06 NOTE — Assessment & Plan Note (Signed)
Will consider saxenda vs contrave and check with her insurance. Continue diet and exercise with goal of losing 1-2lbs per week. Congratulated patient on 18lb weight loss. Call with any concerns.

## 2020-08-17 ENCOUNTER — Telehealth: Payer: Self-pay | Admitting: Family Medicine

## 2020-08-17 MED ORDER — CONTRAVE 8-90 MG PO TB12
ORAL_TABLET | ORAL | 1 refills | Status: DC
Start: 2020-08-17 — End: 2020-11-12

## 2020-08-17 NOTE — Telephone Encounter (Signed)
See mychart message. Patient already has a coupon for the medication.

## 2020-08-17 NOTE — Telephone Encounter (Signed)
Patient knew it was not covered and asked me to send it in anyway. She wants to try a coupon.

## 2020-08-17 NOTE — Telephone Encounter (Signed)
Sam calling from [facility] regarding Naltrexone-buPROPion HCl ER (CONTRAVE) 8-90 MG TB12 [[id number]] the drug is not covered. No PA is accepted.  Please send new script. Please advise CB- [phone]

## 2020-08-17 NOTE — Telephone Encounter (Signed)
Routing to provider. Is there something else we can sent in for the patient?

## 2020-09-15 ENCOUNTER — Telehealth: Payer: Self-pay | Admitting: Family Medicine

## 2020-09-15 NOTE — Telephone Encounter (Signed)
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  Pt states she was moving a pt and hurt her lower back again.  No appts this week.  Pt would like Rx to get her through the next few days. Pt states she is off work this week and can rest up.   [place], [place]. Phone:  [phone]  Fax:  [phone]

## 2020-09-15 NOTE — Telephone Encounter (Signed)
Scheduled for tomorrow.

## 2020-09-15 NOTE — Telephone Encounter (Signed)
Called pt to schedule, she states that she is going out of town Thursday morning and by the time she gets back out Sunday she will not need an appt. Pt would like to be called if there is a cancellation.

## 2020-09-16 ENCOUNTER — Ambulatory Visit: Payer: PRIVATE HEALTH INSURANCE | Admitting: Unknown Physician Specialty

## 2020-10-08 ENCOUNTER — Encounter: Payer: PRIVATE HEALTH INSURANCE | Admitting: Family Medicine

## 2020-10-13 ENCOUNTER — Other Ambulatory Visit: Payer: Self-pay | Admitting: Family Medicine

## 2020-11-12 ENCOUNTER — Other Ambulatory Visit: Payer: Self-pay

## 2020-11-12 ENCOUNTER — Encounter: Payer: Self-pay | Admitting: Family Medicine

## 2020-11-12 ENCOUNTER — Ambulatory Visit (INDEPENDENT_AMBULATORY_CARE_PROVIDER_SITE_OTHER): Payer: PRIVATE HEALTH INSURANCE | Admitting: Family Medicine

## 2020-11-12 VITALS — BP 116/76 | HR 88 | Temp 98.8°F | Wt 230.4 lb

## 2020-11-12 DIAGNOSIS — M25361 Other instability, right knee: Secondary | ICD-10-CM

## 2020-11-12 DIAGNOSIS — M25561 Pain in right knee: Secondary | ICD-10-CM | POA: Diagnosis not present

## 2020-11-12 DIAGNOSIS — Z23 Encounter for immunization: Secondary | ICD-10-CM

## 2020-11-12 MED ORDER — CONTRAVE 8-90 MG PO TB12
ORAL_TABLET | ORAL | 1 refills | Status: DC
Start: 2020-11-12 — End: 2021-02-01

## 2020-11-12 MED ORDER — KETOROLAC TROMETHAMINE 60 MG/2ML IM SOLN
60.0000 mg | Freq: Once | INTRAMUSCULAR | Status: AC
  Administered 2020-11-12: 60 mg via INTRAMUSCULAR

## 2020-11-12 NOTE — Progress Notes (Signed)
BP 116/76   Pulse 88   Temp 98.8 F (37.1 C) (Oral)   Wt 230 lb 6.4 oz (104.5 kg)   LMP  (LMP Unknown)   SpO2 98%   BMI 39.55 kg/m    Subjective:    Patient ID: Amanda Moran, female    DOB: [birth year], 49 y.o.   MRN: [id number]  HPI: Amanda Moran is a 49 y.o. female  Chief Complaint  Patient presents with  . knee giving out    Right knee has for past month it started popping and giving out   KNEE PAIN/INSTABILITY Duration: about a month Involved knee: right Mechanism of injury: unknown Location:diffuse Onset: sudden Severity: severe  Quality:  Throbbing, occasionally sharp Frequency: constant Radiation: no Aggravating factors: walking or moving   Alleviating factors: nothing  Status: worse Treatments attempted: etodalac, rest, ice, heat and APAP  Relief with NSAIDs?:  mild Weakness with weight bearing or walking: yes Sensation of giving way: yes Locking: yes- when laying down Popping: yes Bruising: no Swelling: yes Redness: no Paresthesias/decreased sensation: no Fevers: no  WEIGHT GAIN Duration: chronic Previous attempts at weight loss: yes Complications of obesity: knee arthritis Peak weight: 270 Weight loss goal: 200 Weight loss to date: 40lbs Requesting obesity pharmacotherapy: yes Current weight loss supplements/medications: yes Previous weight loss supplements/meds: no   Relevant past medical, surgical, family and social history reviewed and updated as indicated. Interim medical history since our last visit reviewed. Allergies and medications reviewed and updated.  Review of Systems  Constitutional: Negative.   HENT: Negative.   Respiratory: Negative.   Cardiovascular: Negative.   Gastrointestinal: Negative.   Musculoskeletal: Positive for arthralgias, joint swelling and myalgias. Negative for back pain, gait problem, neck pain and neck stiffness.  Skin: Negative.   Neurological: Negative.   Psychiatric/Behavioral: Negative.      Per HPI unless specifically indicated above     Objective:    BP 116/76   Pulse 88   Temp 98.8 F (37.1 C) (Oral)   Wt 230 lb 6.4 oz (104.5 kg)   LMP  (LMP Unknown)   SpO2 98%   BMI 39.55 kg/m   Wt Readings from Last 3 Encounters:  11/12/20 230 lb 6.4 oz (104.5 kg)  08/06/20 252 lb (114.3 kg)  07/16/20 (!) 255 lb 9.6 oz (115.9 kg)    Physical Exam Vitals and nursing note reviewed.  Constitutional:      General: She is not in acute distress.    Appearance: Normal appearance. She is not ill-appearing, toxic-appearing or diaphoretic.  HENT:     Head: Normocephalic and atraumatic.     Right Ear: External ear normal.     Left Ear: External ear normal.     Nose: Nose normal.     Mouth/Throat:     Mouth: Mucous membranes are moist.     Pharynx: Oropharynx is clear.  Eyes:     General: No scleral icterus.       Right eye: No discharge.        Left eye: No discharge.     Extraocular Movements: Extraocular movements intact.     Conjunctiva/sclera: Conjunctivae normal.     Pupils: Pupils are equal, round, and reactive to light.  Cardiovascular:     Rate and Rhythm: Normal rate and regular rhythm.     Pulses: Normal pulses.     Heart sounds: Normal heart sounds. No murmur heard.  No friction rub. No gallop.   Pulmonary:  Effort: Pulmonary effort is normal. No respiratory distress.     Breath sounds: Normal breath sounds. No stridor. No wheezing, rhonchi or rales.  Chest:     Chest wall: No tenderness.  Musculoskeletal:        General: Normal range of motion.     Cervical back: Normal range of motion and neck supple.  Skin:    General: Skin is warm and dry.     Capillary Refill: Capillary refill takes less than 2 seconds.     Coloration: Skin is not jaundiced or pale.     Findings: No bruising, erythema, lesion or rash.  Neurological:     General: No focal deficit present.     Mental Status: She is alert and oriented to person, place, and time. Mental status is  at baseline.  Psychiatric:        Mood and Affect: Mood normal.        Behavior: Behavior normal.        Thought Content: Thought content normal.        Judgment: Judgment normal.     Results for orders placed or performed in visit on 04/07/20  Microscopic Examination   BLD  Result Value Ref Range   WBC, UA 6-10 (A) 0 - 5 /hpf   RBC None seen 0 - 2 /hpf   Epithelial Cells (non renal) 0-10 0 - 10 /hpf   Mucus, UA Present (A) Not Estab.   Bacteria, UA Few (A) None seen/Few  Urine Culture, Reflex   BLD  Result Value Ref Range   Urine Culture, Routine Final report    Organism ID, Bacteria Comment   Bayer DCA Hb A1c Waived  Result Value Ref Range   HB A1C (BAYER DCA - WAIVED) 5.8 <7.0 %  CBC with Differential/Platelet  Result Value Ref Range   WBC 6.2 3.4 - 10.8 x10E3/uL   RBC 4.59 3.77 - 5.28 x10E6/uL   Hemoglobin 13.6 11.1 - 15.9 g/dL   Hematocrit 41.0 34.0 - 46.6 %   MCV 89 79 - 97 fL   MCH 29.6 26.6 - 33.0 pg   MCHC 33.2 31 - 35 g/dL   RDW 13.5 11.7 - 15.4 %   Platelets 204 150 - 450 x10E3/uL   Neutrophils 54 Not Estab. %   Lymphs 35 Not Estab. %   Monocytes 9 Not Estab. %   Eos 2 Not Estab. %   Basos 0 Not Estab. %   Neutrophils Absolute 3.3 1.40 - 7.00 x10E3/uL   Lymphocytes Absolute 2.2 0 - 3 x10E3/uL   Monocytes Absolute 0.6 0 - 0 x10E3/uL   EOS (ABSOLUTE) 0.1 0.0 - 0.4 x10E3/uL   Basophils Absolute 0.0 0 - 0 x10E3/uL   Immature Granulocytes 0 Not Estab. %   Immature Grans (Abs) 0.0 0.0 - 0.1 x10E3/uL  Comprehensive metabolic panel  Result Value Ref Range   Glucose 81 65 - 99 mg/dL   BUN 14 6 - 24 mg/dL   Creatinine, Ser 0.63 0.57 - 1.00 mg/dL   GFR calc non Af Amer 107 >59 mL/min/1.73   GFR calc Af Amer 124 >59 mL/min/1.73   BUN/Creatinine Ratio 22 9 - 23   Sodium 141 134 - 144 mmol/L   Potassium 3.9 3.5 - 5.2 mmol/L   Chloride 104 96 - 106 mmol/L   CO2 24 20 - 29 mmol/L   Calcium 9.6 8.7 - 10.2 mg/dL   Total Protein 7.4 6.0 - 8.5 g/dL   Albumin 4.6  3.8 - 4.8 g/dL   Globulin, Total 2.8 1.5 - 4.5 g/dL   Albumin/Globulin Ratio 1.6 1.2 - 2.2   Bilirubin Total 0.3 0.0 - 1.2 mg/dL   Alkaline Phosphatase 78 39 - 117 IU/L   AST 19 0 - 40 IU/L   ALT 19 0 - 32 IU/L  Lipid Panel w/o Chol/HDL Ratio  Result Value Ref Range   Cholesterol, Total 221 (H) 100 - 199 mg/dL   Triglycerides 114 0 - 149 mg/dL   HDL 44 >39 mg/dL   VLDL Cholesterol Cal 21 5 - 40 mg/dL   LDL Chol Calc (NIH) 156 (H) 0 - 99 mg/dL  Microalbumin, Urine Waived  Result Value Ref Range   Microalb, Ur Waived 30 (H) 0 - 19 mg/L   Creatinine, Urine Waived 100 10 - 300 mg/dL   Microalb/Creat Ratio <30 <30 mg/g  TSH  Result Value Ref Range   TSH 0.968 0.450 - 4.500 uIU/mL  UA/M w/rflx Culture, Routine   Specimen: Blood   BLD  Result Value Ref Range   Specific Gravity, UA 1.025 1.005 - 1.030   pH, UA 5.0 5.0 - 7.5   Color, UA Yellow Yellow   Appearance Ur Cloudy (A) Clear   Leukocytes,UA 1+ (A) Negative   Protein,UA Negative Negative/Trace   Glucose, UA Negative Negative   Ketones, UA Negative Negative   RBC, UA Negative Negative   Bilirubin, UA Positive (A) Negative   Urobilinogen, Ur 0.2 0.2 - 1.0 mg/dL   Nitrite, UA Negative Negative   Microscopic Examination See below:    Urinalysis Reflex Comment   HIV Antibody (routine testing w rflx)  Result Value Ref Range   HIV Screen 4th Generation wRfx Non Reactive Non Reactive      Assessment & Plan:   Problem List Items Addressed This Visit      Other   Morbid obesity ([place])    Doing great! Has lost 40lbs! Will continue Contrave. Continue to monitor. Call with any concerns.       Relevant Medications   Naltrexone-buPROPion HCl ER (CONTRAVE) 8-90 MG TB12    Other Visit Diagnoses    Acute pain of right knee    -  Primary   Needs to get back into seeing ortho. Referral generated today. Call with any concerns. Continue etodalac, toradol given today.    Relevant Medications   ketorolac (TORADOL) injection 60 mg  (Completed)   Other Relevant Orders   Ambulatory referral to Orthopedic Surgery   Instability of right knee joint       Needs to get back into seeing ortho. Referral generated today. Call with any concerns.    Need for influenza vaccination       Flu shot given today.    Relevant Orders   Flu Vaccine QUAD 6+ mos PF IM (Fluarix Quad PF) (Completed)       Follow up plan: Return as scheduled.

## 2020-11-12 NOTE — Assessment & Plan Note (Signed)
Doing great! Has lost 40lbs! Will continue Contrave. Continue to monitor. Call with any concerns.

## 2020-11-13 ENCOUNTER — Encounter: Payer: Self-pay | Admitting: Family Medicine

## 2021-01-26 ENCOUNTER — Telehealth: Payer: Self-pay | Admitting: Family Medicine

## 2021-01-26 NOTE — Telephone Encounter (Signed)
Medication Refill - Medication: etodolac (LODINE) 500 MG tablet  This Rx goes to [facility]   Naltrexone-buPROPion HCl ER (CONTRAVE) 8-90 MG TB12  This Rx needs to go to [facility] due to pay out of pocket  [id number] [place], [place] [facility] AT [address] Phone:  [phone]  Fax:  [phone]       Has the patient contacted their pharmacy? No. (Agent: If no, request that the patient contact the pharmacy for the refill.) (Agent: If yes, when and what did the pharmacy advise?)  Preferred Pharmacy (with phone number or street name): [facility] [place], [address] [place] [address]  Phone:  [phone] Fax:  [phone]   Agent: Please be advised that RX refills may take up to 3 business days. We ask that you follow-up with your pharmacy.

## 2021-01-27 NOTE — Telephone Encounter (Signed)
Can the contrave be sent to [facility]

## 2021-01-28 NOTE — Telephone Encounter (Signed)
Patient called again checking on her refills and she is now totally out of the etodalac and she states that she was off the Contrave since Jan. 4th because she contracted Covid and the doctor told her if he ever got off she would need to start over. She can reached at [phone]. Please advise

## 2021-02-01 MED ORDER — ETODOLAC 500 MG PO TABS
500.0000 mg | ORAL_TABLET | Freq: Two times a day (BID) | ORAL | 1 refills | Status: DC
Start: 2021-02-01 — End: 2021-05-11

## 2021-02-01 MED ORDER — CONTRAVE 8-90 MG PO TB12
ORAL_TABLET | ORAL | 1 refills | Status: DC
Start: 2021-02-01 — End: 2021-02-17

## 2021-02-01 NOTE — Telephone Encounter (Signed)
Lvm to make this apt. 

## 2021-02-01 NOTE — Telephone Encounter (Signed)
Please schedule follow up for 4-6 weeks out on contrave

## 2021-02-02 NOTE — Telephone Encounter (Signed)
Lvm to make this apt. 

## 2021-02-04 ENCOUNTER — Encounter: Payer: Self-pay | Admitting: Family Medicine

## 2021-02-04 NOTE — Telephone Encounter (Signed)
Lvm to make this apt. Sent letter.  

## 2021-02-12 ENCOUNTER — Ambulatory Visit: Payer: PRIVATE HEALTH INSURANCE | Admitting: Family Medicine

## 2021-02-17 ENCOUNTER — Other Ambulatory Visit: Payer: Self-pay

## 2021-02-17 ENCOUNTER — Encounter: Payer: Self-pay | Admitting: Family Medicine

## 2021-02-17 ENCOUNTER — Ambulatory Visit (INDEPENDENT_AMBULATORY_CARE_PROVIDER_SITE_OTHER): Payer: PRIVATE HEALTH INSURANCE | Admitting: Family Medicine

## 2021-02-17 DIAGNOSIS — N898 Other specified noninflammatory disorders of vagina: Secondary | ICD-10-CM

## 2021-02-17 DIAGNOSIS — N760 Acute vaginitis: Secondary | ICD-10-CM

## 2021-02-17 DIAGNOSIS — A599 Trichomoniasis, unspecified: Secondary | ICD-10-CM

## 2021-02-17 DIAGNOSIS — B9689 Other specified bacterial agents as the cause of diseases classified elsewhere: Secondary | ICD-10-CM

## 2021-02-17 LAB — URINALYSIS, ROUTINE W REFLEX MICROSCOPIC
Bilirubin, UA: NEGATIVE
Glucose, UA: NEGATIVE
Ketones, UA: NEGATIVE
Nitrite, UA: NEGATIVE
Protein,UA: NEGATIVE
RBC, UA: NEGATIVE
Specific Gravity, UA: 1.02 (ref 1.005–1.030)
Urobilinogen, Ur: 0.2 mg/dL (ref 0.2–1.0)
pH, UA: 5.5 (ref 5.0–7.5)

## 2021-02-17 LAB — MICROSCOPIC EXAMINATION
Cast Type: NONE SEEN
Casts: NONE SEEN /lpf
Crystal Type: NONE SEEN
Crystals: NONE SEEN
Renal Epithel, UA: NONE SEEN /hpf
Yeast, UA: NONE SEEN

## 2021-02-17 LAB — WET PREP FOR TRICH, YEAST, CLUE
Clue Cell Exam: POSITIVE — AB
Trichomonas Exam: POSITIVE — AB
Yeast Exam: NEGATIVE

## 2021-02-17 MED ORDER — METRONIDAZOLE 500 MG PO TABS: 500.0000 mg | ORAL_TABLET | Freq: Two times a day (BID) | ORAL | 0 refills | Status: DC

## 2021-02-17 MED ORDER — CONTRAVE 8-90 MG PO TB12: 2.0000 | ORAL_TABLET | Freq: Two times a day (BID) | ORAL | 5 refills | Status: DC

## 2021-02-17 NOTE — Patient Instructions (Signed)
Trichomoniasis Trichomoniasis is an STI (sexually transmitted infection) that can affect both women and men. In women, the outer area of the female genitalia (vulva) and the vagina are affected. In men, mainly the penis is affected, but the prostate and other reproductive organs can also be involved.  This condition can be treated with medicine. It often has no symptoms (is asymptomatic), especially in men. If not treated, trichomoniasis can last for months or years. What are the causes? This condition is caused by a parasite called Trichomonas vaginalis. Trichomoniasis most often spreads from person to person (is contagious) through sexual contact. What increases the risk? The following factors may make you more likely to develop this condition:  Having unprotected sex.  Having sex with a partner who has trichomoniasis.  Having multiple sexual partners.  Having had previous trichomoniasis infections or other STIs. What are the signs or symptoms? In women, symptoms of trichomoniasis include:  Abnormal vaginal discharge that is clear, white, gray, or yellow-green and foamy and has an unusual "fishy" odor.  Itching and irritation of the vagina and vulva.  Burning or pain during urination or sex.  Redness and swelling of the genitals. In men, symptoms of trichomoniasis include:  Penile discharge that may be foamy or contain pus.  Pain in the penis. This may happen only when urinating.  Itching or irritation inside the penis.  Burning after urination or ejaculation. How is this diagnosed? In women, this condition may be found during a routine Pap test or physical exam. It may be found in men during a routine physical exam. Your health care provider may do tests to help diagnose this infection, such as:  Urine tests (men and women).  The following in women: ? Testing the pH of the vagina. ? A vaginal swab test that checks for the Trichomonas vaginalis parasite. ? Testing vaginal  secretions. Your health care provider may test you for other STIs, including HIV (human immunodeficiency virus). How is this treated? This condition is treated with medicine taken by mouth (orally), such as metronidazole or tinidazole, to fight the infection. Your sexual partner(s) also need to be tested and treated.  If you are a woman and you plan to become pregnant or think you may be pregnant, tell your health care provider right away. Some medicines that are used to treat the infection should not be taken during pregnancy. Your health care provider may recommend over-the-counter medicines or creams to help relieve itching or irritation. You may be tested for infection again 3 months after treatment.   Follow these instructions at home:  Take and use over-the-counter and prescription medicines, including creams, only as told by your health care provider.  Take your antibiotic medicine as told by your health care provider. Do not stop taking the antibiotic even if you start to feel better.  Do not have sex until 7-10 days after you finish your medicine, or until your health care provider approves. Ask your health care provider when you may start to have sex again.  (Women) Do not douche or wear tampons while you have the infection.  Discuss your infection with your sexual partner(s). Make sure that your partner gets tested and treated, if necessary.  Keep all follow-up visits as told by your health care provider. This is important. How is this prevented?  Use condoms every time you have sex. Using condoms correctly and consistently can help protect against STIs.  Avoid having multiple sexual partners.  Talk with your sexual partner about   any symptoms that either of you may have, as well as any history of STIs.  Get tested for STIs and STDs (sexually transmitted diseases) before you have sex. Ask your partner to do the same.  Do not have sexual contact if you have symptoms of  trichomoniasis or another STI.   Contact a health care provider if:  You still have symptoms after you finish your medicine.  You develop pain in your abdomen.  You have pain when you urinate.  You have bleeding after sex.  You develop a rash.  You feel nauseous or you vomit.  You plan to become pregnant or think you may be pregnant. Summary  Trichomoniasis is an STI (sexually transmitted infection) that can affect both women and men.  This condition often has no symptoms (is asymptomatic), especially in men.  Without treatment, this condition can last for months or years.  You should not have sex until 7-10 days after you finish your medicine, or until your health care provider approves. Ask your health care provider when you may start to have sex again.  Discuss your infection with your sexual partner(s). Make sure that your partner gets tested and treated, if necessary. This information is not intended to replace advice given to you by your health care provider. Make sure you discuss any questions you have with your health care provider. Document Revised: 09/25/2018 Document Reviewed: 09/25/2018 Elsevier Patient Education  2021 Elsevier Inc.  

## 2021-02-17 NOTE — Progress Notes (Signed)
BP 130/83   Pulse 83   Temp 98.8 F (37.1 C)   Wt 232 lb 6.4 oz (105.4 kg)   LMP  (LMP Unknown)   SpO2 98%   BMI 39.89 kg/m    Subjective:    Patient ID: Amanda Moran, female    DOB: 02-04-1971, 50 y.o.   MRN: [id number]  HPI: Amanda Moran is a 50 y.o. female  Chief Complaint  Patient presents with  . Weight Loss    Follow up   . Vaginal Itching    Patient states she is having vaginal itching on the inside, did notice some white discharge yesterday.    OBESITY- had been off her contrave for at least a month when she had COVID  Duration: chronic Previous attempts at weight loss: yes Complications of obesity: arthritis Peak weight: 270 Weight loss goal: 200lb Weight loss to date: 38lbs  Requesting obesity pharmacotherapy: yes Current weight loss supplements/medications: yes  VAGINAL DISCHARGE- had shaved and used a different deodorant Duration: 2 weeks Discharge description: white  Pruritus: yes Dysuria: yes Malodorous: no Urinary frequency: no Fevers: no Abdominal pain: no  Sexual activity: monogamous History of sexually transmitted diseases: no Recent antibiotic use: no Context: stable  Treatments attempted: none  Relevant past medical, surgical, family and social history reviewed and updated as indicated. Interim medical history since our last visit reviewed. Allergies and medications reviewed and updated.  Review of Systems  Constitutional: Negative.   Respiratory: Negative.   Cardiovascular: Negative.   Gastrointestinal: Negative.   Genitourinary: Positive for vaginal discharge. Negative for decreased urine volume, difficulty urinating, dyspareunia, dysuria, enuresis, flank pain, frequency, genital sores, hematuria, menstrual problem, pelvic pain, urgency, vaginal bleeding and vaginal pain.  Musculoskeletal: Negative.   Neurological: Negative.   Psychiatric/Behavioral: Negative.     Per HPI unless specifically indicated above     Objective:     BP 130/83   Pulse 83   Temp 98.8 F (37.1 C)   Wt 232 lb 6.4 oz (105.4 kg)   LMP  (LMP Unknown)   SpO2 98%   BMI 39.89 kg/m   Wt Readings from Last 3 Encounters:  02/17/21 232 lb 6.4 oz (105.4 kg)  11/12/20 230 lb 6.4 oz (104.5 kg)  08/06/20 252 lb (114.3 kg)    Physical Exam Vitals and nursing note reviewed.  Constitutional:      General: She is not in acute distress.    Appearance: Normal appearance. She is not ill-appearing, toxic-appearing or diaphoretic.  HENT:     Head: Normocephalic and atraumatic.     Right Ear: External ear normal.     Left Ear: External ear normal.     Nose: Nose normal.     Mouth/Throat:     Mouth: Mucous membranes are moist.     Pharynx: Oropharynx is clear.  Eyes:     General: No scleral icterus.       Right eye: No discharge.        Left eye: No discharge.     Extraocular Movements: Extraocular movements intact.     Conjunctiva/sclera: Conjunctivae normal.     Pupils: Pupils are equal, round, and reactive to light.  Cardiovascular:     Rate and Rhythm: Normal rate and regular rhythm.     Pulses: Normal pulses.     Heart sounds: Normal heart sounds. No murmur heard. No friction rub. No gallop.   Pulmonary:     Effort: Pulmonary effort is normal. No respiratory distress.  Breath sounds: Normal breath sounds. No stridor. No wheezing, rhonchi or rales.  Chest:     Chest wall: No tenderness.  Musculoskeletal:        General: Normal range of motion.     Cervical back: Normal range of motion and neck supple.  Skin:    General: Skin is warm and dry.     Capillary Refill: Capillary refill takes less than 2 seconds.     Coloration: Skin is not jaundiced or pale.     Findings: No bruising, erythema, lesion or rash.  Neurological:     General: No focal deficit present.     Mental Status: She is alert and oriented to person, place, and time. Mental status is at baseline.  Psychiatric:        Mood and Affect: Mood normal.         Behavior: Behavior normal.        Thought Content: Thought content normal.        Judgment: Judgment normal.     Results for orders placed or performed in visit on 02/17/21  WET PREP FOR [place], YEAST, CLUE   Specimen: Urine   Urine  Result Value Ref Range   Trichomonas Exam Positive (A) Negative   Yeast Exam Negative Negative   Clue Cell Exam Positive (A) Negative  Microscopic Examination   Urine  Result Value Ref Range   WBC, UA 0-5 0 - 5 /hpf   RBC 0-2 0 - 2 /hpf   Epithelial Cells (non renal) 0-10 0 - 10 /hpf   Renal Epithel, UA None seen None seen /hpf   Casts None seen None seen /lpf   Cast Type None seen N/A   Crystals None seen N/A   Crystal Type None seen N/A   Mucus, UA Present Not Estab.   Bacteria, UA Few None seen/Few   Yeast, UA None seen None seen   Trichomonas, UA Present None seen  Urinalysis, Routine w reflex microscopic  Result Value Ref Range   Specific Gravity, UA 1.020 1.005 - 1.030   pH, UA 5.5 5.0 - 7.5   Color, UA Yellow Yellow   Appearance Ur Hazy (A) Clear   Leukocytes,UA 1+ (A) Negative   Protein,UA Negative Negative/Trace   Glucose, UA Negative Negative   Ketones, UA Negative Negative   RBC, UA Negative Negative   Bilirubin, UA Negative Negative   Urobilinogen, Ur 0.2 0.2 - 1.0 mg/dL   Nitrite, UA Negative Negative   Microscopic Examination See below:       Assessment & Plan:   Problem List Items Addressed This Visit      Other   Morbid obesity ([place]) - Primary    Will restart her contrave. Call with any concerns or if not improving       Relevant Medications   Naltrexone-buPROPion HCl ER (CONTRAVE) 8-90 MG TB12    Other Visit Diagnoses    Vaginal discharge       + clue cells and trich. Will treat with metronidazole. Call with any concerns. Return in 10 days for repeat wet prep.    Relevant Orders   Urinalysis, Routine w reflex microscopic (Completed)   WET PREP FOR TRICH, YEAST, CLUE (Completed)   BV (bacterial vaginosis)        Will treat with metronidazole. Call with any concerns. Return in 10 days for repeat wet prep.   Relevant Medications   metroNIDAZOLE (FLAGYL) 500 MG tablet   Other Relevant Orders   WET  PREP FOR TRICH, YEAST, CLUE   Trichimoniasis       Will treat with metronidazole. Call with any concerns. Return in 10 days for repeat wet prep.   Relevant Medications   metroNIDAZOLE (FLAGYL) 500 MG tablet   Other Relevant Orders   WET PREP FOR [place], YEAST, CLUE       Follow up plan: Return in about 6 months (around 08/17/2021) for physical.

## 2021-02-18 NOTE — Assessment & Plan Note (Signed)
Will restart her contrave. Call with any concerns or if not improving

## 2021-02-19 ENCOUNTER — Telehealth: Payer: Self-pay

## 2021-02-19 NOTE — Telephone Encounter (Signed)
PA for contrave started. [place]: [place]

## 2021-02-24 ENCOUNTER — Other Ambulatory Visit: Payer: Self-pay

## 2021-02-24 ENCOUNTER — Other Ambulatory Visit: Payer: PRIVATE HEALTH INSURANCE

## 2021-02-24 DIAGNOSIS — N760 Acute vaginitis: Secondary | ICD-10-CM

## 2021-02-24 DIAGNOSIS — A599 Trichomoniasis, unspecified: Secondary | ICD-10-CM

## 2021-02-24 LAB — WET PREP FOR TRICH, YEAST, CLUE
Clue Cell Exam: POSITIVE — AB
Trichomonas Exam: NEGATIVE
Yeast Exam: NEGATIVE

## 2021-02-25 ENCOUNTER — Other Ambulatory Visit: Payer: Self-pay | Admitting: Family Medicine

## 2021-02-25 MED ORDER — METRONIDAZOLE 500 MG PO TABS: 500.0000 mg | ORAL_TABLET | Freq: Two times a day (BID) | ORAL | 0 refills | Status: DC

## 2021-03-11 ENCOUNTER — Encounter: Payer: Self-pay | Admitting: Family Medicine

## 2021-03-11 DIAGNOSIS — N898 Other specified noninflammatory disorders of vagina: Secondary | ICD-10-CM

## 2021-03-29 ENCOUNTER — Ambulatory Visit: Payer: Self-pay | Admitting: *Deleted

## 2021-03-29 ENCOUNTER — Encounter: Payer: Self-pay | Admitting: Family Medicine

## 2021-03-29 ENCOUNTER — Other Ambulatory Visit: Payer: Self-pay

## 2021-03-29 ENCOUNTER — Ambulatory Visit (INDEPENDENT_AMBULATORY_CARE_PROVIDER_SITE_OTHER): Payer: PRIVATE HEALTH INSURANCE | Admitting: Family Medicine

## 2021-03-29 VITALS — BP 128/86 | HR 82 | Temp 98.2°F | Ht 64.0 in | Wt 233.6 lb

## 2021-03-29 DIAGNOSIS — F419 Anxiety disorder, unspecified: Secondary | ICD-10-CM

## 2021-03-29 MED ORDER — HYDROXYZINE HCL 25 MG PO TABS: 25.0000 mg | ORAL_TABLET | Freq: Three times a day (TID) | ORAL | 1 refills | Status: DC | PRN

## 2021-03-29 NOTE — Assessment & Plan Note (Signed)
Had a panic attack today at work due to [employer]. Feeling better now. Will start hydoxyzine to help with severe anxiety. Call with any concerns. Recheck 6 weeks at physical.

## 2021-03-29 NOTE — Patient Instructions (Signed)
[email or web address]">  Panic Attack A panic attack is a sudden episode of severe anxiety, fear, or discomfort that causes physical and emotional symptoms. The attack may be in response to something frightening, or it may occur for no known reason. Symptoms of a panic attack can be similar to symptoms of a heart attack or stroke. It is important to see your health care provider when you have a panic attack so that these conditions can be ruled out. A panic attack is a symptom of another condition. Most panic attacks go away with treatment of the underlying problem. If you have panic attacks often, you may have a condition called panic disorder. What are the causes? A panic attack may be caused by:  An extreme, life-threatening situation, such as a war or natural disaster.  An anxiety disorder, such as post-traumatic stress disorder.  Depression.  Certain medical conditions, including heart problems, neurological conditions, and infections.  Certain over-the-counter and prescription medicines.  Illegal drugs that increase heart rate and blood pressure, such as methamphetamine.  Alcohol.  Supplements that increase anxiety.  Panic disorder. What increases the risk? You are more likely to develop this condition if:  You have an anxiety disorder.  You have another mental health condition.  You take certain medicines.  You use alcohol, illegal drugs, or other substances.  You are under extreme stress.  A life event is causing increased feelings of anxiety and depression. What are the signs or symptoms? A panic attack starts suddenly, usually lasts about 20 minutes, and occurs with one or more of the following:  A pounding heart.  A feeling that your heart is beating irregularly or faster than normal (palpitations).  Sweating.  Trembling or shaking.  Shortness of breath or feeling smothered.  Feeling choked.  Chest pain or  discomfort.  Nausea or a strange feeling in your stomach.  Dizziness, feeling lightheaded, or feeling like you might faint.  Chills or hot flashes.  Numbness or tingling in your lips, hands, or feet.  Feeling confused, or feeling that you are not yourself.  Fear of losing control or being emotionally unstable.  Fear of dying. How is this diagnosed? A panic attack is diagnosed with an assessment by your health care provider. During the assessment your health care provider will ask questions about:  Your history of anxiety, depression, and panic attacks.  Your medical history.  Whether you drink alcohol, use illegal drugs, take supplements, or take medicines. Be honest about your substance use. Your health care provider may also:  Order blood tests or other kinds of tests to rule out serious medical conditions.  Refer you to a mental health professional for further evaluation.   How is this treated? Treatment depends on the cause of the panic attack:  If the cause is a medical problem, your health care provider will either treat that problem or refer you to a specialist.  If the cause is emotional, you may be given anti-anxiety medicines or referred to a counselor. These medicines may reduce how often attacks happen, reduce how severe the attacks are, and lower anxiety.  If the cause is a medicine, your health care provider may tell you to stop the medicine, change your dose, or take a different medicine.  If the cause is a drug, treatment may involve letting the drug wear off and taking medicine to help the drug leave your body or to counteract its effects. Attacks caused by drug abuse may continue even if you stop using the  drug. Follow these instructions at home:  Take over-the-counter and prescription medicines only as told by your health care provider.  If you feel anxious, limit your caffeine intake.  Take good care of your physical and mental health by: ? Eating a  balanced diet that includes plenty of fresh fruits and vegetables, whole grains, lean meats, and low-fat dairy. ? Getting plenty of rest. Try to get 7-8 hours of uninterrupted sleep each night. ? Exercising regularly. Try to get 30 minutes of physical activity at least 5 days a week. ? Not smoking. Talk to your health care provider if you need help quitting. ? Limiting alcohol intake to no more than 1 drink a day for nonpregnant women and 2 drinks a day for men. One drink equals 12 oz of beer, 5 oz of wine, or 1 oz of hard liquor.  Keep all follow-up visits as told by your health care provider. This is important. Panic attacks may have underlying physical or emotional problems that take time to accurately diagnose. Contact a health care provider if:  Your symptoms do not improve, or they get worse.  You are not able to take your medicine as prescribed because of side effects. Get help right away if:  You have serious thoughts about hurting yourself or others.  You have symptoms of a panic attack. Do not drive yourself to the hospital. Have someone else drive you or call an ambulance. If you ever feel like you may hurt yourself or others, or you have thoughts about taking your own life, get help right away. You can go to your nearest emergency department or call:  Your local emergency services (911 in the [place]  A suicide crisis helpline, such as the [facility] at 303-623-1579. This is open 24 hours a day. Summary  A panic attack is a sign of a serious health or mental health condition. Get help right away. Do not drive yourself to the hospital. Have someone else drive you or call an ambulance.  Always see a health care provider to have the reasons for the panic attack correctly diagnosed.  If your panic attack was caused by a physical problem, follow your health care provider's suggestions for medicine, referral to a specialist, and lifestyle changes.  If  your panic attack was caused by an emotional problem, follow through with counseling from a qualified mental health specialist.  If you feel like you may hurt yourself or others, call 911 and get help right away. This information is not intended to replace advice given to you by your health care provider. Make sure you discuss any questions you have with your health care provider. Document Revised: 06/11/2020 Document Reviewed: 06/11/2020 Elsevier Patient Education  2021 [place], Adult After being diagnosed with an anxiety disorder, you may be relieved to know why you have felt or behaved a certain way. You may also feel overwhelmed about the treatment ahead and what it will mean for your life. With care and support, you can manage this condition and recover from it. How to manage lifestyle changes Managing stress and anxiety Stress is your body's reaction to life changes and events, both good and bad. Most stress will last just a few hours, but stress can be ongoing and can lead to more than just stress. Although stress can play a major role in anxiety, it is not the same as anxiety. Stress is usually caused by something external, such as a deadline, test, or  competition. Stress normally passes after the triggering event has ended.  Anxiety is caused by something internal, such as imagining a terrible outcome or worrying that something will go wrong that will devastate you. Anxiety often does not go away even after the triggering event is over, and it can become long-term (chronic) worry. It is important to understand the differences between stress and anxiety and to manage your stress effectively so that it does not lead to an anxious response. Talk with your health care provider or a counselor to learn more about reducing anxiety and stress. He or she may suggest tension reduction techniques, such as:  Music therapy. This can include creating or listening to music that you  enjoy and that inspires you.  Mindfulness-based meditation. This involves being aware of your normal breaths while not trying to control your breathing. It can be done while sitting or walking.  Centering prayer. This involves focusing on a word, phrase, or sacred image that means something to you and brings you peace.  Deep breathing. To do this, expand your stomach and inhale slowly through your nose. Hold your breath for 3-5 seconds. Then exhale slowly, letting your stomach muscles relax.  Self-talk. This involves identifying thought patterns that lead to anxiety reactions and changing those patterns.  Muscle relaxation. This involves tensing muscles and then relaxing them. Choose a tension reduction technique that suits your lifestyle and personality. These techniques take time and practice. Set aside 5-15 minutes a day to do them. Therapists can offer counseling and training in these techniques. The training to help with anxiety may be covered by some insurance plans. Other things you can do to manage stress and anxiety include:  Keeping a stress/anxiety diary. This can help you learn what triggers your reaction and then learn ways to manage your response.  Thinking about how you react to certain situations. You may not be able to control everything, but you can control your response.  Making time for activities that help you relax and not feeling guilty about spending your time in this way.  Visual imagery and yoga can help you stay calm and relax.   Medicines Medicines can help ease symptoms. Medicines for anxiety include:  Anti-anxiety drugs.  Antidepressants. Medicines are often used as a primary treatment for anxiety disorder. Medicines will be prescribed by a health care provider. When used together, medicines, psychotherapy, and tension reduction techniques may be the most effective treatment. Relationships Relationships can play a big part in helping you recover. Try to spend  more time connecting with trusted friends and family members. Consider going to couples counseling, taking family education classes, or going to family therapy. Therapy can help you and others better understand your condition. How to recognize changes in your anxiety Everyone responds differently to treatment for anxiety. Recovery from anxiety happens when symptoms decrease and stop interfering with your daily activities at home or work. This may mean that you will start to:  Have better concentration and focus. Worry will interfere less in your daily thinking.  Sleep better.  Be less irritable.  Have more energy.  Have improved memory. It is important to recognize when your condition is getting worse. Contact your health care provider if your symptoms interfere with home or work and you feel like your condition is not improving. Follow these instructions at home: Activity  Exercise. Most adults should do the following: ? Exercise for at least 150 minutes each week. The exercise should increase your heart rate and  make you sweat (moderate-intensity exercise). ? Strengthening exercises at least twice a week.  Get the right amount and quality of sleep. Most adults need 7-9 hours of sleep each night. Lifestyle  Eat a healthy diet that includes plenty of vegetables, fruits, whole grains, low-fat dairy products, and lean protein. Do not eat a lot of foods that are high in solid fats, added sugars, or salt.  Make choices that simplify your life.  Do not use any products that contain nicotine or tobacco, such as cigarettes, e-cigarettes, and chewing tobacco. If you need help quitting, ask your health care provider.  Avoid caffeine, alcohol, and certain over-the-counter cold medicines. These may make you feel worse. Ask your pharmacist which medicines to avoid.   General instructions  Take over-the-counter and prescription medicines only as told by your health care provider.  Keep all  follow-up visits as told by your health care provider. This is important. Where to find support You can get help and support from these sources:  Self-help groups.  Online and [employer].  A trusted spiritual leader.  Couples counseling.  Family education classes.  Family therapy. Where to find more information You may find that joining a support group helps you deal with your anxiety. The following sources can help you locate counselors or support groups near you:  [place]: www.mentalhealthamerica.net  Anxiety and Depression Association of [place] (ADAA): https://www.clark.net/  National Alliance on Mental Illness (NAMI): www.nami.org Contact a health care provider if you:  Have a hard time staying focused or finishing daily tasks.  Spend many hours a day feeling worried about everyday life.  Become exhausted by worry.  Start to have headaches, feel tense, or have nausea.  Urinate more than normal.  Have diarrhea. Get help right away if you have:  A racing heart and shortness of breath.  Thoughts of hurting yourself or others. If you ever feel like you may hurt yourself or others, or have thoughts about taking your own life, get help right away. You can go to your nearest emergency department or call:  Your local emergency services (911 in the [place]  A suicide crisis helpline, such as the [facility] at 307-675-0361. This is open 24 hours a day. Summary  Taking steps to learn and use tension reduction techniques can help calm you and help prevent triggering an anxiety reaction.  When used together, medicines, psychotherapy, and tension reduction techniques may be the most effective treatment.  Family, friends, and partners can play a big part in helping you recover from an anxiety disorder. This information is not intended to replace advice given to you by your health care provider. Make sure you discuss any questions  you have with your health care provider. Document Revised: 05/14/2019 Document Reviewed: 05/14/2019 Elsevier Patient Education  [place].

## 2021-03-29 NOTE — Telephone Encounter (Signed)
Pt called in crying, jittery, hyperventilating from an incident that happened at work yesterday and again this morning. 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   Yesterday she had a whole hall of pts to herself 20 pts.   She wasn't able to see all of them due to the work load.   A family member got upset with her due to this fact.  "I don't have family get upset with me because I truly care about and take care of my residents so this really upset me yesterday".    "I worked 8 straight hours yesterday and still could not see all my residents".   She did not sleep well at all last night due to this and the work load.  This morning she was told she would have a whole hall of residents to herself again.  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    She started crying and hyperventilating.   "I could not control myself".    "I've never had this before and I think I'm having an anxiety attack". She left work.   "I can't work like this".   "I just arrived home".   "I'm in my car but I'm home now".   See notes below.  After allowing her to talk for a few minutes she calmed down and "I feel much better now".   "Thank you for letting me talk".   "I'm breathing easier now and the tingling in my fingers has gone away".   "Breathing into the paper bag helped some too".  She denied being suicidal.   She mentioned this herself without me asking.  She is on Contrave for weight loss but has been on it since last June.    I don't think this anxiety is from that medication but I thought I would ask Dr. Wynetta Emery and make sure.  I made her an in office appt with Dr. Wynetta Emery for today at 2:40.   Covid questionnaire completed.  I sent my notes to [facility] for Dr. Jinny Blossom Johnson's information.   Reason for Disposition . Patient sounds very upset or troubled to the triager  Answer Assessment - Initial Assessment Questions 1. 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   At 2:45 a family member got on to me. Today upon coming to work they told me I have another whole hall to myself again.     I started crying.  I can't calm down.   I'm smoking cigarettes.  Drinking a lot of coffee.     I keeping hearing this family member.    I didn't see that pt all day yesterday for 8 hrs.    There were 5 pts I didn't get to see yesterday.    I'm so upset.  2. ANXIETY SYMPTOM SCREENING: "Can you describe how you have been feeling?"  (e.g., tense, restless, panicky, anxious, keyed up, trouble sleeping, trouble concentrating)     I'm so upset and I can't calm down.   I've talked with my husband and my sister and I can't calm down.   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    I've never had these pressures on the job.    I care for the people.   The supervisor told me I told me I don't have time to brush  people's teeth.   That's part of my job.   I care about people.   This has never happened to me before.   Usually smoking calms me down but not this morning.    I left work this morning.   I can't care for people like this.   I can't stop crying.   "They want me to take short cuts with taking care of the residents".      I don't know how to calm down.   What should I do to calm down?   I've trying to breath in a paper bag.   I'm jittery.      3. ONSET: "How long have you been feeling this way?"     This morning after the supervisor got onto me about brushing my pt's teeth.   Yesterday's incident made me not sleep well last night.   This morning I have a whole hall to myself.   Only 3 CNAs in the whole facility today.   I was put on a new medicine Contrave.   A weight loss medication.   Is that contributing to this.   My husband mentioned talking to Dr. Wynetta Emery regarding about pills.    I've been on the Contrave for 6 months now.   Since June of last year so I don't think it's the Contrave.     I've never had these symptoms  before.  I may need to change jobs.   My sister told me I'm having a anxiety attack.   I've never had an anxiety attack.     I'm feeling much better after talking with you.    I'm not suicidal.    4. RECURRENT: "Have you felt this way before?"  If Yes, ask: "What happened that time?" "What helped these feelings go away in the past?"      No 5. RISK OF HARM - SUICIDAL IDEATION:  "Do you ever have thoughts of hurting or killing yourself?"  (e.g., yes, no, no but preoccupation with thoughts about death)   - INTENT:  "Do you have thoughts of hurting or killing yourself right NOW?" (e.g., yes, no, N/A)   - PLAN: "Do you have a specific plan for how you would do this?" (e.g., gun, knife, overdose, no plan, N/A)     No 6. RISK OF HARM - HOMICIDAL IDEATION:  "Do you ever have thoughts of hurting or killing someone else?"  (e.g., yes, no, no but preoccupation with thoughts about death)   - INTENT:  "Do you have thoughts of hurting or killing someone right NOW?" (e.g., yes, no, N/A)   - PLAN: "Do you have a specific plan for how you would do this?" (e.g., gun, knife, no plan, N/A)      No 7. FUNCTIONAL IMPAIRMENT: "How have things been going for you overall? Have you had more difficulty than usual doing your normal daily activities?"  (e.g., better, same, worse; self-care, school, work, interactions)     Severe impairment   8. SUPPORT: "Who is with you now?" "Who do you live with?" "Do you have family or friends who you can talk to?"      I'm home now.    My brother is with me.   9. THERAPIST: "Do you have a counselor or therapist? Name?"     No 10. STRESSORS: "Has there been any new stress or recent changes in your life?"       See above 11. CAFFEINE USE: "Do you  drink caffeinated beverages, and how much each day?" (e.g., coffee, tea, colas)       Reduce caffeine nd smoking 12. ALCOHOL USE OR SUBSTANCE USE (DRUG USE): "Do you drink alcohol or use any illegal drugs?"       No 13. OTHER SYMPTOMS: "Do  you have any other physical symptoms right now?" (e.g., chest pain, palpitations, difficulty breathing, fever)       No chest pain.   I was hyperventilating.   My fingers were tingling but not now.   I know that was from hyperventilating.   14. PREGNANCY: "Is there any chance you are pregnant?" "When was your last menstrual period?"       Not asked  Protocols used: ANXIETY AND PANIC ATTACK-A-AH

## 2021-03-29 NOTE — Progress Notes (Signed)
BP 128/86   Pulse 82   Temp 98.2 F (36.8 C) (Oral)   Ht 5\' 4"  (1.626 m)   Wt 233 lb 9.6 oz (106 kg)   LMP  (LMP Unknown)   SpO2 97%   BMI 40.10 kg/m    Subjective:    Patient ID: Amanda Moran, female    DOB: Feb 28, 1971, 50 y.o.   MRN: [id number]  HPI: Amanda Moran is a 50 y.o. female  Chief Complaint  Patient presents with  . Anxiety    Patient felt as if she were having an anxiety attack this morning, feeling better now.  Was crying this morning, couldn't calm down, had to leave job and go home. 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  Patient started taking Contrave for a few months. Patient felt out of control today.  Marland Kitchen Headache   ANXIETY/STRESS Duration: 2 days Status:exacerbated Anxious mood: yes  Excessive worrying: yes Irritability: yes  Sweating: no Nausea: no Palpitations:no Hyperventilation: no Panic attacks: yes Agoraphobia: no  Obscessions/compulsions: no Depressed mood: no Depression screen [facility] 2/9 03/29/2021 07/16/2020 05/16/2019 04/29/2019 03/11/2019  Decreased Interest 0 0 1 1 0  Down, Depressed, Hopeless 0 0 0 0 0  PHQ - 2 Score 0 0 1 1 0  Altered sleeping - - 1 3 0  Tired, decreased energy - - 1 2 0  Change in appetite - - 2 2 0  Feeling bad or failure about yourself  - - 0 0 0  Trouble concentrating - - 1 2 0  Moving slowly or fidgety/restless - - 0 0 0  Suicidal thoughts - - 0 0 0  PHQ-9 Score - - 6 10 0  Difficult doing work/chores - - Somewhat difficult Somewhat difficult Not difficult at all   Anhedonia: no Weight changes: no Insomnia: no   Hypersomnia: no Fatigue/loss of energy: no Feelings of worthlessness: no Feelings of guilt: no Impaired concentration/indecisiveness: no Suicidal ideations: no  Crying spells: no Recent Stressors/Life Changes: yes   Relationship problems: no   Family stress: no     Financial stress: no     Job stress: yes    Recent death/loss: no  Relevant past medical, surgical, family and social history reviewed and updated as indicated. Interim medical history since our last visit reviewed. Allergies and medications reviewed and updated.  Review of Systems  Constitutional: Negative.   Respiratory: Negative.   Cardiovascular: Negative.   Gastrointestinal: Negative.   Musculoskeletal: Negative.   Skin: Negative.   Neurological: Negative.   Psychiatric/Behavioral: Negative for agitation, behavioral problems, confusion, decreased concentration, dysphoric mood, hallucinations, self-injury, sleep disturbance and suicidal ideas. The patient is nervous/anxious. The patient is not hyperactive.     Per HPI unless specifically indicated above     Objective:    BP 128/86   Pulse 82   Temp 98.2 F (36.8 C) (Oral)   Ht 5\' 4"  (1.626 m)   Wt 233 lb 9.6 oz (106 kg)   LMP  (LMP Unknown)   SpO2 97%   BMI 40.10 kg/m   Wt Readings from Last 3 Encounters:  03/29/21 233 lb 9.6 oz (106 kg)  02/17/21 232 lb 6.4 oz (105.4 kg)  11/12/20 230 lb 6.4 oz (104.5 kg)    Physical Exam Vitals and nursing note reviewed.  Constitutional:      General: She is not in acute  distress.    Appearance: Normal appearance. She is not ill-appearing, toxic-appearing or diaphoretic.  HENT:     Head: Normocephalic and atraumatic.     Right Ear: External ear normal.     Left Ear: External ear normal.     Nose: Nose normal.     Mouth/Throat:     Mouth: Mucous membranes are moist.     Pharynx: Oropharynx is clear.  Eyes:     General: No scleral icterus.       Right eye: No discharge.        Left eye: No discharge.     Extraocular Movements: Extraocular movements intact.     Conjunctiva/sclera: Conjunctivae normal.     Pupils: Pupils are equal, round, and reactive to light.  Cardiovascular:     Rate and Rhythm: Normal rate and regular rhythm.     Pulses: Normal pulses.     Heart sounds: Normal heart sounds.  No murmur heard. No friction rub. No gallop.   Pulmonary:     Effort: Pulmonary effort is normal. No respiratory distress.     Breath sounds: Normal breath sounds. No stridor. No wheezing, rhonchi or rales.  Chest:     Chest wall: No tenderness.  Musculoskeletal:        General: Normal range of motion.     Cervical back: Normal range of motion and neck supple.  Skin:    General: Skin is warm and dry.     Capillary Refill: Capillary refill takes less than 2 seconds.     Coloration: Skin is not jaundiced or pale.     Findings: No bruising, erythema, lesion or rash.  Neurological:     General: No focal deficit present.     Mental Status: She is alert and oriented to person, place, and time. Mental status is at baseline.  Psychiatric:        Mood and Affect: Mood normal.        Behavior: Behavior normal.        Thought Content: Thought content normal.        Judgment: Judgment normal.     Results for orders placed or performed in visit on 02/24/21  WET PREP FOR [place], YEAST, CLUE   Specimen: Sterile Swab   Sterile Swab  Result Value Ref Range   Trichomonas Exam Negative Negative   Yeast Exam Negative Negative   Clue Cell Exam Positive (A) Negative      Assessment & Plan:   Problem List Items Addressed This Visit      Other   Acute anxiety - Primary    Had a panic attack today at work due to [employer]. Feeling better now. Will start hydoxyzine to help with severe anxiety. Call with any concerns. Recheck 6 weeks at physical.      Relevant Medications   hydrOXYzine (ATARAX/VISTARIL) 25 MG tablet       Follow up plan: Return in about 6 weeks (around 05/10/2021) for physical.

## 2021-03-31 ENCOUNTER — Telehealth: Payer: Self-pay | Admitting: Family Medicine

## 2021-03-31 ENCOUNTER — Encounter: Payer: Self-pay | Admitting: Family Medicine

## 2021-03-31 NOTE — Telephone Encounter (Signed)
Patient called to request a TB skin test.  Please advise and call patient to let her know when she could have the test.  CB# [phone]

## 2021-03-31 NOTE — Telephone Encounter (Signed)
Spoke with pt to let her know we do not do the tb skin test only bloodwork Pt verbalized understanding and will go elsewhere to get the skin test done.

## 2021-04-06 ENCOUNTER — Other Ambulatory Visit: Payer: Self-pay

## 2021-04-12 ENCOUNTER — Other Ambulatory Visit: Payer: Self-pay

## 2021-04-26 ENCOUNTER — Encounter: Payer: Self-pay | Admitting: Family Medicine

## 2021-05-10 ENCOUNTER — Encounter: Payer: PRIVATE HEALTH INSURANCE | Admitting: Family Medicine

## 2021-05-11 ENCOUNTER — Ambulatory Visit (INDEPENDENT_AMBULATORY_CARE_PROVIDER_SITE_OTHER): Payer: PRIVATE HEALTH INSURANCE | Admitting: Family Medicine

## 2021-05-11 ENCOUNTER — Other Ambulatory Visit: Payer: Self-pay

## 2021-05-11 ENCOUNTER — Encounter: Payer: Self-pay | Admitting: Family Medicine

## 2021-05-11 ENCOUNTER — Other Ambulatory Visit: Payer: Self-pay | Admitting: Family Medicine

## 2021-05-11 VITALS — BP 110/78 | HR 99 | Temp 98.0°F | Ht 64.0 in | Wt 234.6 lb

## 2021-05-11 DIAGNOSIS — N760 Acute vaginitis: Secondary | ICD-10-CM

## 2021-05-11 DIAGNOSIS — B9689 Other specified bacterial agents as the cause of diseases classified elsewhere: Secondary | ICD-10-CM | POA: Diagnosis not present

## 2021-05-11 DIAGNOSIS — N898 Other specified noninflammatory disorders of vagina: Secondary | ICD-10-CM | POA: Diagnosis not present

## 2021-05-11 DIAGNOSIS — Z0000 Encounter for general adult medical examination without abnormal findings: Secondary | ICD-10-CM | POA: Diagnosis not present

## 2021-05-11 DIAGNOSIS — A599 Trichomoniasis, unspecified: Secondary | ICD-10-CM

## 2021-05-11 DIAGNOSIS — Z1211 Encounter for screening for malignant neoplasm of colon: Secondary | ICD-10-CM

## 2021-05-11 LAB — WET PREP FOR TRICH, YEAST, CLUE
Clue Cell Exam: POSITIVE — AB
Trichomonas Exam: POSITIVE — AB
Yeast Exam: NEGATIVE

## 2021-05-11 LAB — URINALYSIS, ROUTINE W REFLEX MICROSCOPIC
Bilirubin, UA: NEGATIVE
Glucose, UA: NEGATIVE
Ketones, UA: NEGATIVE
Leukocytes,UA: NEGATIVE
Nitrite, UA: NEGATIVE
Protein,UA: NEGATIVE
RBC, UA: NEGATIVE
Specific Gravity, UA: 1.02 (ref 1.005–1.030)
Urobilinogen, Ur: 0.2 mg/dL (ref 0.2–1.0)
pH, UA: 6 (ref 5.0–7.5)

## 2021-05-11 MED ORDER — METRONIDAZOLE 500 MG PO TABS: 500.0000 mg | ORAL_TABLET | Freq: Two times a day (BID) | ORAL | 0 refills | Status: DC

## 2021-05-11 MED ORDER — ETODOLAC 500 MG PO TABS: 500.0000 mg | ORAL_TABLET | Freq: Two times a day (BID) | ORAL | 1 refills | Status: DC

## 2021-05-11 NOTE — Progress Notes (Signed)
BP 110/78   Pulse 99   Temp 98 F (36.7 C)   Ht 5\' 4"  (1.626 m)   Wt 234 lb 9.6 oz (106.4 kg)   LMP  (LMP Unknown)   SpO2 98%   BMI 40.27 kg/m    Subjective:    Patient ID: Amanda Moran, female    DOB: 1971/12/08, 50 y.o.   MRN: [id number]  HPI: Amanda Moran is a 50 y.o. female presenting on 05/11/2021 for comprehensive medical examination. Current medical complaints include:  VAGINAL DISCHARGE Duration: 2 months Discharge description: clear  Pruritus: yes Dysuria: no Malodorous: yes Urinary frequency: yes Fevers: no Abdominal pain: no  Sexual activity: monogamous History of sexually transmitted diseases: yes Recent antibiotic use: no Context: recurrent BV  Treatments attempted: none  Menopausal Symptoms: yes- has been having hot flashes and night sweats  Depression Screen done today and results listed below:  Depression screen [facility] 2/9 05/11/2021 03/29/2021 07/16/2020 05/16/2019 04/29/2019  Decreased Interest 0 0 0 1 1  Down, Depressed, Hopeless 0 0 0 0 0  PHQ - 2 Score 0 0 0 1 1  Altered sleeping 3 - - 1 3  Tired, decreased energy 2 - - 1 2  Change in appetite 1 - - 2 2  Feeling bad or failure about yourself  0 - - 0 0  Trouble concentrating 0 - - 1 2  Moving slowly or fidgety/restless 0 - - 0 0  Suicidal thoughts 0 - - 0 0  PHQ-9 Score 6 - - 6 10  Difficult doing work/chores Not difficult at all - - Somewhat difficult Somewhat difficult    Past Medical History:  Past Medical History:  Diagnosis Date  . Abnormal uterine bleeding 05/20/2015   Overview:  Hgb 7.9 on 5/19 preoperatively. At [facility] preop.   . Acanthosis nigricans   . Anemia   . Anxiety   . Diabetes mellitus without complication [facility])    patient unaware  . Fibroids   . Fibroids   . Headache   . Iron deficiency anemia     Surgical History:  Past Surgical History:  Procedure Laterality Date  . ABDOMINAL HYSTERECTOMY  05/29/2015   Procedure: HYSTERECTOMY ABDOMINAL;  Surgeon: Ruffin Frederick, MD;  Location: [facility] ORS;  Service: Gynecology;;  . ACHILLES TENDON SURGERY Left 06/07/2019   Procedure: ACHILLES REPAIR SECONDARY LEFT;  Surgeon: Samara Deist, DPM;  Location: [facility] OR[facility];  Service: Podiatry;  Laterality: Left;  . ACHILLES TENDON SURGERY Right 08/30/2019   Procedure: ACHILLES REPAIR SECONDARY RIGHT;  Surgeon: Samara Deist, DPM;  Location: [facility] OR[facility];  Service: Podiatry;  Laterality: Right;  . BREAST BIOPSY Left 10/04/2017   [place] bx /clip-neg  . CALCANEAL OSTEOTOMY Right 08/30/2019   Procedure: HAGLUNDS/RETROCALCANEAL (OSTECTOMY) RIGHT;  Surgeon: Samara Deist, DPM;  Location: [facility] OR[facility];  Service: Podiatry;  Laterality: Right;  . CHOLECYSTECTOMY    . KNEE ARTHROSCOPY Right   . KNEE ARTHROSCOPY WITH ANTERIOR CRUCIATE LIGAMENT (ACL) REPAIR WITH HAMSTRING GRAFT Right 02/14/2017   Procedure: KNEE ARTHROSCOPY WITH ANTERIOR CRUCIATE LIGAMENT (ACL) REPAIR WITH HAMSTRING GRAFT;  Surgeon: Thornton Park, MD;  Location: [facility] OR[facility];  Service: Orthopedics;  Laterality: Right;  . LAPAROSCOPIC SUPRACERVICAL HYSTERECTOMY N/A 05/29/2015   Procedure: LAPAROSCOPIC SUPRACERVICAL HYSTERECTOMY;  Surgeon: Ruffin Frederick, MD;  Location: [facility] OR[facility];  Service: Gynecology;  Laterality: N/A;  attempted  . OSTECTOMY Left 06/07/2019   Procedure: HAGLUNDS/RETROCALCANEAL OSTECTOMY;  Surgeon: Samara Deist, DPM;  Location: [facility] OR[facility];  Service: Podiatry;  Laterality: Left;  .  TUBAL LIGATION      Medications:  Current Outpatient Medications on File Prior to Visit  Medication Sig  . acetaminophen (TYLENOL) 500 MG tablet Take 1,000 mg by mouth every 8 (eight) hours as needed for mild pain or headache.   . hydrOXYzine (ATARAX/VISTARIL) 25 MG tablet Take 1 tablet (25 mg total) by mouth every 8 (eight) hours as needed for anxiety. (Patient not taking: Reported on 05/11/2021)  . Naltrexone-buPROPion HCl ER (CONTRAVE) 8-90 MG TB12 Take 2 tablets by mouth in the morning and at bedtime. (Patient not taking: Reported  on 05/11/2021)   No current facility-administered medications on file prior to visit.    Allergies:  No Known Allergies  Social History:  Social History   Socioeconomic History  . Marital status: Married    Spouse name: Not on file  . Number of children: Not on file  . Years of education: Not on file  . Highest education level: Not on file  Occupational History  . Not on file  Tobacco Use  . Smoking status: Current Every Day Smoker    Packs/day: 1.00    Types: Cigarettes  . Smokeless tobacco: Never Used  Vaping Use  . Vaping Use: Never used  Substance and Sexual Activity  . Alcohol use: Yes    Comment: socially  . Drug use: No  . Sexual activity: Yes  Other Topics Concern  . Not on file  Social History Narrative  . Not on file   Social Determinants of Health   Financial Resource Strain: Not on file  Food Insecurity: Not on file  Transportation Needs: Not on file  Physical Activity: Not on file  Stress: Not on file  Social Connections: Not on file  Intimate Partner Violence: Not on file   Social History   Tobacco Use  Smoking Status Current Every Day Smoker  . Packs/day: 1.00  . Types: Cigarettes  Smokeless Tobacco Never Used   Social History   Substance and Sexual Activity  Alcohol Use Yes   Comment: socially    Family History:  Family History  Problem Relation Age of Onset  . Alcohol abuse Father   . Arthritis Father   . Diabetes Father   . Drug abuse Father   . Cancer Father        pancreatic  . Diabetes Brother   . Cancer Mother        cervical  . Breast cancer Maternal Aunt   . Breast cancer Cousin        mat cousins    Past medical history, surgical history, medications, allergies, family history and social history reviewed with patient today and changes made to appropriate areas of the chart.   Review of Systems  Constitutional: Positive for diaphoresis. Negative for chills, fever, malaise/fatigue and weight loss.  HENT: Negative.    Eyes: Positive for blurred vision. Negative for double vision, photophobia, pain, discharge and redness.  Respiratory: Negative.   Cardiovascular: Negative.   Gastrointestinal: Negative.   Genitourinary: Positive for frequency. Negative for dysuria, flank pain, hematuria and urgency.  Musculoskeletal: Negative.   Skin: Negative.        Mole in her vaginal area that is irriated  Neurological: Negative.   Endo/Heme/Allergies: Positive for polydipsia. Negative for environmental allergies. Does not bruise/bleed easily.  Psychiatric/Behavioral: Negative for depression, hallucinations, memory loss, substance abuse and suicidal ideas. The patient is nervous/anxious. The patient does not have insomnia.    All other ROS negative except what is listed above  and in the HPI.      Objective:    BP 110/78   Pulse 99   Temp 98 F (36.7 C)   Ht 5\' 4"  (1.626 m)   Wt 234 lb 9.6 oz (106.4 kg)   LMP  (LMP Unknown)   SpO2 98%   BMI 40.27 kg/m   Wt Readings from Last 3 Encounters:  05/11/21 234 lb 9.6 oz (106.4 kg)  03/29/21 233 lb 9.6 oz (106 kg)  02/17/21 232 lb 6.4 oz (105.4 kg)    Physical Exam Vitals and nursing note reviewed.  Constitutional:      General: She is not in acute distress.    Appearance: Normal appearance. She is not ill-appearing, toxic-appearing or diaphoretic.  HENT:     Head: Normocephalic and atraumatic.     Right Ear: Tympanic membrane, ear canal and external ear normal. There is no impacted cerumen.     Left Ear: Tympanic membrane, ear canal and external ear normal. There is no impacted cerumen.     Nose: Nose normal. No congestion or rhinorrhea.     Mouth/Throat:     Mouth: Mucous membranes are moist.     Pharynx: Oropharynx is clear. No oropharyngeal exudate or posterior oropharyngeal erythema.  Eyes:     General: No scleral icterus.       Right eye: No discharge.        Left eye: No discharge.     Extraocular Movements: Extraocular movements intact.      Conjunctiva/sclera: Conjunctivae normal.     Pupils: Pupils are equal, round, and reactive to light.  Neck:     Vascular: No carotid bruit.  Cardiovascular:     Rate and Rhythm: Normal rate and regular rhythm.     Pulses: Normal pulses.     Heart sounds: No murmur heard. No friction rub. No gallop.   Pulmonary:     Effort: Pulmonary effort is normal. No respiratory distress.     Breath sounds: Normal breath sounds. No stridor. No wheezing, rhonchi or rales.  Chest:     Chest wall: No tenderness.  Abdominal:     General: Abdomen is flat. Bowel sounds are normal. There is no distension.     Palpations: Abdomen is soft. There is no mass.     Tenderness: There is no abdominal tenderness. There is no right CVA tenderness, left CVA tenderness, guarding or rebound.     Hernia: No hernia is present.  Genitourinary:    Comments: Breast and pelvic exams deferred with shared decision making Musculoskeletal:        General: No swelling, tenderness, deformity or signs of injury.     Cervical back: Normal range of motion and neck supple. No rigidity. No muscular tenderness.     Right lower leg: No edema.     Left lower leg: No edema.  Lymphadenopathy:     Cervical: No cervical adenopathy.  Skin:    General: Skin is warm and dry.     Capillary Refill: Capillary refill takes less than 2 seconds.     Coloration: Skin is not jaundiced or pale.     Findings: No bruising, erythema, lesion or rash.  Neurological:     General: No focal deficit present.     Mental Status: She is alert and oriented to person, place, and time. Mental status is at baseline.     Cranial Nerves: No cranial nerve deficit.     Sensory: No sensory deficit.  Motor: No weakness.     Coordination: Coordination normal.     Gait: Gait normal.     Deep Tendon Reflexes: Reflexes normal.  Psychiatric:        Mood and Affect: Mood normal.        Behavior: Behavior normal.        Thought Content: Thought content normal.         Judgment: Judgment normal.     Results for orders placed or performed in visit on 02/24/21  WET PREP FOR [place], YEAST, CLUE   Specimen: Sterile Swab   Sterile Swab  Result Value Ref Range   Trichomonas Exam Negative Negative   Yeast Exam Negative Negative   Clue Cell Exam Positive (A) Negative      Assessment & Plan:   Problem List Items Addressed This Visit   None   Visit Diagnoses    Routine general medical examination at a health care facility    -  Primary   Vaccines up to date. Screening labs checked today. Pap N/A. Mammo and cologuard ordered. Continue diet and exercise. Call with any concerns.   Relevant Orders   CBC with Differential/Platelet   Comprehensive metabolic panel   Lipid Panel w/o Chol/HDL Ratio   Urinalysis, Routine w reflex microscopic   TSH   Hepatitis C Antibody   Vaginal discharge       + BV and trich   Relevant Orders   WET PREP FOR TRICH, YEAST, CLUE   BV (bacterial vaginosis)       Will treat with flagyl- return for recheck after abx done.    Relevant Medications   metroNIDAZOLE (FLAGYL) 500 MG tablet   Trichimoniasis       Will treat with flagyl- return for recheck after abx done.    Relevant Medications   metroNIDAZOLE (FLAGYL) 500 MG tablet   Screening for colon cancer       Cologuard ordered today.   Relevant Orders   Cologuard       Follow up plan: Return in about 1 year (around 05/11/2022) for physical.   LABORATORY TESTING:  - Pap smear: not applicable  IMMUNIZATIONS:   - Tdap: Tetanus vaccination status reviewed: last tetanus booster within 10 years. - Influenza: Up to date - Pneumovax: Up to date - COVID: Up to date  SCREENING: -Mammogram: Order in- encouraged her to go  - Colonoscopy: cologuard ordered today   PATIENT COUNSELING:   Advised to take 1 mg of folate supplement per day if capable of pregnancy.   Sexuality: Discussed sexually transmitted diseases, partner selection, use of condoms, avoidance of  unintended pregnancy  and contraceptive alternatives.   Advised to avoid cigarette smoking.  I discussed with the patient that most people either abstain from alcohol or drink within safe limits (<=14/week and <=4 drinks/occasion for males, <=7/weeks and <= 3 drinks/occasion for females) and that the risk for alcohol disorders and other health effects rises proportionally with the number of drinks per week and how often a drinker exceeds daily limits.  Discussed cessation/primary prevention of drug use and availability of treatment for abuse.   Diet: Encouraged to adjust caloric intake to maintain  or achieve ideal body weight, to reduce intake of dietary saturated fat and total fat, to limit sodium intake by avoiding high sodium foods and not adding table salt, and to maintain adequate dietary potassium and calcium preferably from fresh fruits, vegetables, and low-fat dairy products.    stressed the importance of regular  exercise  Injury prevention: Discussed safety belts, safety helmets, smoke detector, smoking near bedding or upholstery.   Dental health: Discussed importance of regular tooth brushing, flossing, and dental visits.    NEXT PREVENTATIVE PHYSICAL DUE IN 1 YEAR. Return in about 1 year (around 05/11/2022) for physical.

## 2021-05-11 NOTE — Patient Instructions (Addendum)
Call to schedule your mammogram:  [facility] at [facility]  Address: [address], [place], [place] [address]  Phone: [phone]  [place], Evening Primrose Oil, [place] Maintenance, Female Adopting a healthy lifestyle and getting preventive care are important in promoting health and wellness. Ask your health care provider about:  The right schedule for you to have regular tests and exams.  Things you can do on your own to prevent diseases and keep yourself healthy. What should I know about diet, weight, and exercise? Eat a healthy diet  Eat a diet that includes plenty of vegetables, fruits, low-fat dairy products, and lean protein.  Do not eat a lot of foods that are high in solid fats, added sugars, or sodium.   Maintain a healthy weight Body mass index (BMI) is used to identify weight problems. It estimates body fat based on height and weight. Your health care provider can help determine your BMI and help you achieve or maintain a healthy weight. Get regular exercise Get regular exercise. This is one of the most important things you can do for your health. Most adults should:  Exercise for at least 150 minutes each week. The exercise should increase your heart rate and make you sweat (moderate-intensity exercise).  Do strengthening exercises at least twice a week. This is in addition to the moderate-intensity exercise.  Spend less time sitting. Even light physical activity can be beneficial. Watch cholesterol and blood lipids Have your blood tested for lipids and cholesterol at 49 years of age, then have this test every 5 years. Have your cholesterol levels checked more often if:  Your lipid or cholesterol levels are high.  You are older than 49 years of age.  You are at high risk for heart disease. What should I know about cancer screening? Depending on your health history and family history, you may need to have cancer  screening at various ages. This may include screening for:  Breast cancer.  Cervical cancer.  Colorectal cancer.  Skin cancer.  Lung cancer. What should I know about heart disease, diabetes, and high blood pressure? Blood pressure and heart disease  High blood pressure causes heart disease and increases the risk of stroke. This is more likely to develop in people who have high blood pressure readings, are of African descent, or are overweight.  Have your blood pressure checked: ? Every 3-5 years if you are 45-10 years of age. ? Every year if you are 107 years old or older. Diabetes Have regular diabetes screenings. This checks your fasting blood sugar level. Have the screening done:  Once every three years after age 6 if you are at a normal weight and have a low risk for diabetes.  More often and at a younger age if you are overweight or have a high risk for diabetes. What should I know about preventing infection? Hepatitis B If you have a higher risk for hepatitis B, you should be screened for this virus. Talk with your health care provider to find out if you are at risk for hepatitis B infection. Hepatitis C Testing is recommended for:  Everyone born from [birth year] through 1964.  Anyone with known risk factors for hepatitis C. Sexually transmitted infections (STIs)  Get screened for STIs, including gonorrhea and chlamydia, if: ? You are sexually active and are younger than 49 years of age. ? You are older than 49 years of age and your health care provider tells you  that you are at risk for this type of infection. ? Your sexual activity has changed since you were last screened, and you are at increased risk for chlamydia or gonorrhea. Ask your health care provider if you are at risk.  Ask your health care provider about whether you are at high risk for HIV. Your health care provider may recommend a prescription medicine to help prevent HIV infection. If you choose to take  medicine to prevent HIV, you should first get tested for HIV. You should then be tested every 3 months for as long as you are taking the medicine. Pregnancy  If you are about to stop having your period (premenopausal) and you may become pregnant, seek counseling before you get pregnant.  Take 400 to 800 micrograms (mcg) of folic acid every day if you become pregnant.  Ask for birth control (contraception) if you want to prevent pregnancy. Osteoporosis and menopause Osteoporosis is a disease in which the bones lose minerals and strength with aging. This can result in bone fractures. If you are 17 years old or older, or if you are at risk for osteoporosis and fractures, ask your health care provider if you should:  Be screened for bone loss.  Take a calcium or vitamin D supplement to lower your risk of fractures.  Be given hormone replacement therapy (HRT) to treat symptoms of menopause. Follow these instructions at home: Lifestyle  Do not use any products that contain nicotine or tobacco, such as cigarettes, e-cigarettes, and chewing tobacco. If you need help quitting, ask your health care provider.  Do not use street drugs.  Do not share needles.  Ask your health care provider for help if you need support or information about quitting drugs. Alcohol use  Do not drink alcohol if: ? Your health care provider tells you not to drink. ? You are pregnant, may be pregnant, or are planning to become pregnant.  If you drink alcohol: ? Limit how much you use to 0-1 drink a day. ? Limit intake if you are breastfeeding.  Be aware of how much alcohol is in your drink. In the [place] one drink equals one 12 oz bottle of beer (355 mL), one 5 oz glass of wine (148 mL), or one 1 oz glass of hard liquor (44 mL). General instructions  Schedule regular health, dental, and eye exams.  Stay current with your vaccines.  Tell your health care provider if: ? You often feel depressed. ? You have  ever been abused or do not feel safe at home. Summary  Adopting a healthy lifestyle and getting preventive care are important in promoting health and wellness.  Follow your health care provider's instructions about healthy diet, exercising, and getting tested or screened for diseases.  Follow your health care provider's instructions on monitoring your cholesterol and blood pressure. This information is not intended to replace advice given to you by your health care provider. Make sure you discuss any questions you have with your health care provider. Document Revised: 12/05/2018 Document Reviewed: 12/05/2018 Elsevier Patient Education  2021 [place].  https://www.womenshealth.gov/menopause/menopause-basics"> https://www.clinicalkey.com">  Menopause Menopause is the normal time of a woman's life when menstrual periods stop completely. It marks the natural end to a woman's ability to become pregnant. It can be defined as the absence of a menstrual period for 12 months without another medical cause. The transition to menopause (perimenopause) most often happens between the ages of 30 and 60, and can last for many years. During perimenopause,  hormone levels change in your body, which can cause symptoms and affect your health. Menopause may increase your risk for:  Weakened bones (osteoporosis), which causes fractures.  Depression.  Hardening and narrowing of the arteries (atherosclerosis), which can cause heart attacks and strokes. What are the causes? This condition is usually caused by a natural change in hormone levels that happens as you get older. The condition may also be caused by changes that are not natural, including:  Surgery to remove both ovaries (surgical menopause).  Side effects from some medicines, such as chemotherapy used to treat cancer (chemical menopause). What increases the risk? This condition is more likely to start at an earlier age if you have certain medical  conditions or have undergone treatments, including:  A tumor of the pituitary gland in the brain.  A disease that affects the ovaries and hormones.  Certain cancer treatments, such as chemotherapy or hormone therapy, or radiation therapy on the pelvis.  Heavy smoking and excessive alcohol use.  Family history of early menopause. This condition is also more likely to develop earlier in women who are very thin. What are the signs or symptoms? Symptoms of this condition include:  Hot flashes.  Irregular menstrual periods.  Night sweats.  Changes in feelings about sex. This could be a decrease in sex drive or an increased discomfort around your sexuality.  Vaginal dryness and thinning of the vaginal walls. This may cause painful sex.  Dryness of the skin and development of wrinkles.  Headaches.  Problems sleeping (insomnia).  Mood swings or irritability.  Memory problems.  Weight gain.  Hair growth on the face and chest.  Bladder infections or problems with urinating. How is this diagnosed? This condition is diagnosed based on your medical history, a physical exam, your age, your menstrual history, and your symptoms. Hormone tests may also be done. How is this treated? In some cases, no treatment is needed. You and your health care provider should make a decision together about whether treatment is necessary. Treatment will be based on your individual condition and preferences. Treatment for this condition focuses on managing symptoms. Treatment may include:  Menopausal hormone therapy (MHT).  Medicines to treat specific symptoms or complications.  Acupuncture.  Vitamin or herbal supplements. Before starting treatment, make sure to let your health care provider know if you have a personal or family history of these conditions:  Heart disease.  Breast cancer.  Blood clots.  Diabetes.  Osteoporosis. Follow these instructions at home: Lifestyle  Do not use  any products that contain nicotine or tobacco, such as cigarettes, e-cigarettes, and chewing tobacco. If you need help quitting, ask your health care provider.  Get at least 30 minutes of physical activity on 5 or more days each week.  Avoid alcoholic and caffeinated beverages, as well as spicy foods. This may help prevent hot flashes.  Get 7-8 hours of sleep each night.  If you have hot flashes, try: ? Dressing in layers. ? Avoiding things that may trigger hot flashes, such as spicy food, warm places, or stress. ? Taking slow, deep breaths when a hot flash starts. ? Keeping a fan in your home and office.  Find ways to manage stress, such as deep breathing, meditation, or journaling.  Consider going to group therapy with other women who are having menopause symptoms. Ask your health care provider about recommended group therapy meetings. Eating and drinking  Eat a healthy, balanced diet that contains whole grains, lean protein, low-fat dairy, and  plenty of fruits and vegetables.  Your health care provider may recommend adding more soy to your diet. Foods that contain soy include tofu, tempeh, and soy milk.  Eat plenty of foods that contain calcium and vitamin D for bone health. Items that are rich in calcium include low-fat milk, yogurt, beans, almonds, sardines, broccoli, and kale.   Medicines  Take over-the-counter and prescription medicines only as told by your health care provider.  Talk with your health care provider before starting any herbal supplements. If prescribed, take vitamins and supplements as told by your health care provider. General instructions  Keep track of your menstrual periods, including: ? When they occur. ? How heavy they are and how long they last. ? How much time passes between periods.  Keep track of your symptoms, noting when they start, how often you have them, and how long they last.  Use vaginal lubricants or moisturizers to help with vaginal  dryness and improve comfort during sex.  Keep all follow-up visits. This is important. This includes any group therapy or counseling.   Contact a health care provider if:  You are still having menstrual periods after age 39.  You have pain during sex.  You have not had a period for 12 months and you develop vaginal bleeding. Get help right away if you have:  Severe depression.  Excessive vaginal bleeding.  Pain when you urinate.  A fast or irregular heartbeat (palpitations).  Severe headaches.  Abdominal pain or severe indigestion. Summary  Menopause is a normal time of life when menstrual periods stop completely. It is usually defined as the absence of a menstrual period for 12 months without another medical cause.  The transition to menopause (perimenopause) most often happens between the ages of 8 and 26 and can last for several years.  Symptoms can be managed through medicines, lifestyle changes, and complementary therapies such as acupuncture.  Eat a balanced diet that is rich in nutrients to promote bone health and heart health and to manage symptoms during menopause. This information is not intended to replace advice given to you by your health care provider. Make sure you discuss any questions you have with your health care provider. Document Revised: 09/11/2020 Document Reviewed: 05/28/2020 Elsevier Patient Education  2021 [place] [employer] [phone] ed., pp. [phone]). [place], [place]: Elsevier.">  Perimenopause Perimenopause is the normal time of a woman's life when the levels of estrogen, the female hormone produced by the ovaries, begin to decrease. This leads to changes in menstrual periods before they stop completely (menopause). Perimenopause can begin 2-8 years before menopause. During perimenopause, the ovaries may or may not produce an egg and a woman can still become pregnant. What are the causes? This condition is caused by a  natural change in hormone levels that happens as you get older. What increases the risk? This condition is more likely to start at an earlier age if you have certain medical conditions or have undergone treatments, including:  A tumor of the pituitary gland in the brain.  A disease that affects the ovaries and hormone production.  Certain cancer treatments, such as chemotherapy or hormone therapy, or radiation therapy on the pelvis.  Heavy smoking and excessive alcohol use.  Family history of early menopause. What are the signs or symptoms? Perimenopausal changes affect each woman differently. Symptoms of this condition may include:  Hot flashes.  Irregular menstrual periods.  Night sweats.  Changes in feelings about sex. This could be  a decrease in sex drive or an increased discomfort around your sexuality.  Vaginal dryness.  Headaches.  Mood swings.  Depression.  Problems sleeping (insomnia).  Memory problems or trouble concentrating.  Irritability.  Tiredness.  Weight gain.  Anxiety.  Trouble getting pregnant. How is this diagnosed? This condition is diagnosed based on your medical history, a physical exam, your age, your menstrual history, and your symptoms. Hormone tests may also be done. How is this treated? In some cases, no treatment is needed. You and your health care provider should make a decision together about whether treatment is necessary. Treatment will be based on your individual condition and preferences. Various treatments are available, such as:  Menopausal hormone therapy (MHT).  Medicines to treat specific symptoms.  Acupuncture.  Vitamin or herbal supplements. Before starting treatment, make sure to let your health care provider know if you have a personal or family history of:  Heart disease.  Breast cancer.  Blood clots.  Diabetes.  Osteoporosis. Follow these instructions at home: Medicines  Take over-the-counter and  prescription medicines only as told by your health care provider.  Take vitamin supplements only as told by your health care provider.  Talk with your health care provider before starting any herbal supplements. Lifestyle  Do not use any products that contain nicotine or tobacco, such as cigarettes, e-cigarettes, and chewing tobacco. If you need help quitting, ask your health care provider.  Get at least 30 minutes of physical activity on 5 or more days each week.  Eat a balanced diet that includes fresh fruits and vegetables, whole grains, soybeans, eggs, lean meat, and low-fat dairy.  Avoid alcoholic and caffeinated beverages, as well as spicy foods. This may help prevent hot flashes.  Get 7-8 hours of sleep each night.  Dress in layers that can be removed to help you manage hot flashes.  Find ways to manage stress, such as deep breathing, meditation, or journaling.   General instructions  Keep track of your menstrual periods, including: ? When they occur. ? How heavy they are and how long they last. ? How much time passes between periods.  Keep track of your symptoms, noting when they start, how often you have them, and how long they last.  Use vaginal lubricants or moisturizers to help with vaginal dryness and improve comfort during sex.  You can still become pregnant if you are having irregular periods. Make sure you use contraception during perimenopause if you do not want to get pregnant.  Keep all follow-up visits. This is important. This includes any group therapy or counseling.   Contact a health care provider if:  You have heavy vaginal bleeding or pass blood clots.  Your period lasts more than 2 days longer than normal.  Your periods are recurring sooner than 21 days.  You bleed after having sex.  You have pain during sex. Get help right away if you have:  Chest pain, trouble breathing, or trouble talking.  Severe depression.  Pain when you  urinate.  Severe headaches.  Vision problems. Summary  Perimenopause is the time when a woman's body begins to move into menopause. This may happen naturally or as a result of other health problems or medical treatments.  Perimenopause can begin 2-8 years before menopause, and it can last for several years.  Perimenopausal symptoms can be managed through medicines, lifestyle changes, and complementary therapies such as acupuncture. This information is not intended to replace advice given to you by your health care provider.  Make sure you discuss any questions you have with your health care provider. Document Revised: 05/28/2020 Document Reviewed: 05/28/2020 Elsevier Patient Education  2021 [employer].

## 2021-05-12 LAB — CBC WITH DIFFERENTIAL/PLATELET
Basophils Absolute: 0 10*3/uL (ref 0.0–0.2)
Basos: 0 %
EOS (ABSOLUTE): 0.1 10*3/uL (ref 0.0–0.4)
Eos: 2 %
Hematocrit: 40.3 % (ref 34.0–46.6)
Hemoglobin: 13.5 g/dL (ref 11.1–15.9)
Immature Grans (Abs): 0 10*3/uL (ref 0.0–0.1)
Immature Granulocytes: 0 %
Lymphocytes Absolute: 2.5 10*3/uL (ref 0.7–3.1)
Lymphs: 36 %
MCH: 30 pg (ref 26.6–33.0)
MCHC: 33.5 g/dL (ref 31.5–35.7)
MCV: 90 fL (ref 79–97)
Monocytes Absolute: 0.6 10*3/uL (ref 0.1–0.9)
Monocytes: 8 %
Neutrophils Absolute: 3.8 10*3/uL (ref 1.4–7.0)
Neutrophils: 54 %
Platelets: 187 10*3/uL (ref 150–450)
RBC: 4.5 x10E6/uL (ref 3.77–5.28)
RDW: 12.5 % (ref 11.7–15.4)
WBC: 7.1 10*3/uL (ref 3.4–10.8)

## 2021-05-12 LAB — COMPREHENSIVE METABOLIC PANEL
ALT: 13 IU/L (ref 0–32)
AST: 17 IU/L (ref 0–40)
Albumin/Globulin Ratio: 1.6 (ref 1.2–2.2)
Albumin: 4.5 g/dL (ref 3.8–4.8)
Alkaline Phosphatase: 72 IU/L (ref 44–121)
BUN/Creatinine Ratio: 21 (ref 9–23)
BUN: 16 mg/dL (ref 6–24)
Bilirubin Total: 0.4 mg/dL (ref 0.0–1.2)
CO2: 22 mmol/L (ref 20–29)
Calcium: 9.5 mg/dL (ref 8.7–10.2)
Chloride: 105 mmol/L (ref 96–106)
Creatinine, Ser: 0.76 mg/dL (ref 0.57–1.00)
Globulin, Total: 2.9 g/dL (ref 1.5–4.5)
Glucose: 130 mg/dL — ABNORMAL HIGH (ref 65–99)
Potassium: 4.2 mmol/L (ref 3.5–5.2)
Sodium: 143 mmol/L (ref 134–144)
Total Protein: 7.4 g/dL (ref 6.0–8.5)
eGFR: 97 mL/min/{1.73_m2} (ref >59)

## 2021-05-12 LAB — LIPID PANEL W/O CHOL/HDL RATIO
Cholesterol, Total: 233 mg/dL — ABNORMAL HIGH (ref 100–199)
HDL: 55 mg/dL (ref >39)
LDL Chol Calc (NIH): 162 mg/dL — ABNORMAL HIGH (ref 0–99)
Triglycerides: 92 mg/dL (ref 0–149)
VLDL Cholesterol Cal: 16 mg/dL (ref 5–40)

## 2021-05-12 LAB — HEPATITIS C ANTIBODY: Hep C Virus Ab: <0.1 s/co ratio (ref 0.0–0.9)

## 2021-05-12 LAB — TSH: TSH: 0.632 u[IU]/mL (ref 0.450–4.500)

## 2021-06-08 ENCOUNTER — Telehealth: Payer: Self-pay

## 2021-06-08 NOTE — Telephone Encounter (Signed)
Called pt advised everything is ready pt will come pick it up in the morning

## 2021-06-08 NOTE — Telephone Encounter (Signed)
Copied from [facility] [id number]. Topic: General - Other >> Jun 08, 2021  9:44 AM Yvette Rack wrote: Reason for CRM: Pt stated she is not able to print information from [facility] so she would like to request that the letters from the 4th and the 6th as well as the after care summary be printed so she can pick them up. Pt requests call back once all the information is printed out so she can come pick it up.    Lvm to come pick up avs unsure which letters she is referring too please ask if she calls back if its for 05/27/2021 and 03/31/2021?

## 2021-06-08 NOTE — Telephone Encounter (Signed)
Patient shares that the letters are from 03/29/21 and 03/31/21  Patient also needs the after visit summary 03/29/21

## 2021-12-22 ENCOUNTER — Telehealth: Payer: Self-pay | Admitting: Family Medicine

## 2021-12-22 NOTE — Telephone Encounter (Signed)
Pt put in an appointment request on MyChart and put "OBGYN" in the comments. I called pt to get clarification on what she needs for her appt and to get her scheduled. I was unable to leave a voicemail due to a full mailbox.

## 2022-04-07 ENCOUNTER — Ambulatory Visit: Payer: BC Managed Care – PPO | Admitting: Family Medicine

## 2022-04-07 ENCOUNTER — Encounter: Payer: Self-pay | Admitting: Family Medicine

## 2022-04-07 VITALS — BP 142/83 | HR 101 | Temp 98.2°F | Wt 261.7 lb

## 2022-04-07 DIAGNOSIS — R232 Flushing: Secondary | ICD-10-CM | POA: Diagnosis not present

## 2022-04-07 DIAGNOSIS — F419 Anxiety disorder, unspecified: Secondary | ICD-10-CM | POA: Diagnosis not present

## 2022-04-07 DIAGNOSIS — M545 Low back pain, unspecified: Secondary | ICD-10-CM | POA: Diagnosis not present

## 2022-04-07 MED ORDER — HYDROXYZINE HCL 25 MG PO TABS: 25.0000 mg | ORAL_TABLET | Freq: Three times a day (TID) | ORAL | 1 refills | Status: DC | PRN

## 2022-04-07 MED ORDER — ETODOLAC 500 MG PO TABS: 500.0000 mg | ORAL_TABLET | Freq: Two times a day (BID) | ORAL | 1 refills | Status: DC

## 2022-04-07 MED ORDER — KETOROLAC TROMETHAMINE 60 MG/2ML IM SOLN
60.0000 mg | Freq: Once | INTRAMUSCULAR | Status: AC
  Administered 2022-04-07: 60 mg via INTRAMUSCULAR

## 2022-04-07 MED ORDER — VENLAFAXINE HCL ER 37.5 MG PO CP24: 37.5000 mg | ORAL_CAPSULE | Freq: Every day | ORAL | 3 refills | Status: DC

## 2022-04-07 MED ORDER — CYCLOBENZAPRINE HCL 10 MG PO TABS: 10.0000 mg | ORAL_TABLET | Freq: Every day | ORAL | 0 refills | Status: DC

## 2022-04-07 NOTE — Assessment & Plan Note (Signed)
Encouraged diet and exercise with goal of losing 1-2lbs per week.  

## 2022-04-07 NOTE — Progress Notes (Signed)
? ?BP (!) 142/83   Pulse (!) 101   Temp 98.2 ?F (36.8 ?C)   Wt 261 lb 11.2 oz (118.7 kg)   LMP  (LMP Unknown)   SpO2 97%   BMI 44.92 kg/m?   ? ?Subjective:  ? ? Patient ID: Amanda Moran, female    DOB: [birth year], 51 y.o.   MRN: [id number] ? ?HPI: ?Amanda Moran is a 51 y.o. female ? ?Chief Complaint  ?Patient presents with  ? Back Pain  ?  Patient states she has cramps in the middle of her back, started about 2 weeks ago. Patient states she recently changed jobs and thinks its her chair. Patient states she is not able to sleep at night due to pain it feels like pinching.   ? Hot Flashes  ?  Patient states she is having hot flashes through out the day and is sweating a lot at night. Patient is having a hard time controlling her emotions.   ? Anxiety  ?  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 ?BACK PAIN ?Duration: 2 weeks ?Mechanism of injury: no trauma ?Location: Right and low back ?Onset: sudden ?Severity: moderate ?Quality: cramping and pinching ?Frequency: worse at night ?Radiation: none ?Aggravating factors: relaxing ?Alleviating factors: nothing ?Status: worse ?Treatments attempted:  etodalac, tylenol, rest, ice, and heat  ?Relief with NSAIDs?: no ?Nighttime pain:  yes ?Paresthesias / decreased sensation:  no ?Bowel / bladder incontinence:  no ?Fevers:  no ?Dysuria / urinary frequency:  no ? ?ANXIETY/STRESS ?Duration: months ?Status:uncontrolled ?Anxious mood: yes  ?Excessive worrying: yes ?Irritability: yes  ?Sweating: yes ?Nausea: no ?Palpitations:no ?Hyperventilation: no ?Panic attacks: no ?Agoraphobia: no  ?Obscessions/compulsions: no ?Depressed mood: yes ? ?  05/11/2021  ? 10:08 AM 03/29/2021  ?  2:51 PM 07/16/2020  ?  4:24 PM 05/16/2019  ?  3:39 PM 04/29/2019  ?  3:37 PM  ?Depression screen PHQ 2/9  ?Decreased Interest 0 0 0 1 1  ?Down, Depressed, Hopeless 0 0 0 0 0  ?PHQ - 2 Score 0 0 0 1 1  ?Altered sleeping 3   1 3   ?Tired,  decreased energy 2   1 2   ?Change in appetite 1   2 2   ?Feeling bad or failure about yourself  0   0 0  ?Trouble concentrating 0   1 2  ?Moving slowly or fidgety/restless 0   0 0  ?Suicidal thoughts 0   0 0  ?PHQ-9 Score 6   6 10   ?Difficult doing work/chores Not difficult at all   Somewhat difficult Somewhat difficult  ? ?Anhedonia: no ?Weight changes: no ?Insomnia: yes hard to fall asleep  ?Hypersomnia: no ?Fatigue/loss of energy: yes ?Feelings of worthlessness: no ?Feelings of guilt: no ?Impaired concentration/indecisiveness: no ?Suicidal ideations: no  ?Crying spells: no ?Recent Stressors/Life Changes: yes ?  Relationship problems: no ?  Family stress: no   ?  Financial stress: no  ?  Job stress: yes  ?  Recent death/loss: no ? ?Relevant past medical, surgical, family and social history reviewed and updated as indicated. Interim medical history since our last visit reviewed. ?Allergies and medications reviewed and updated. ? ?Review of Systems  ?Constitutional:  Positive for diaphoresis. Negative for activity change, appetite change, chills, fatigue, fever and unexpected weight change.  ?Respiratory: Negative.    ?Cardiovascular: Negative.   ?Gastrointestinal: Negative.   ?Musculoskeletal:  Positive for back pain and  myalgias. Negative for arthralgias, gait problem, joint swelling, neck pain and neck stiffness.  ?Psychiatric/Behavioral:  Positive for dysphoric mood. Negative for agitation, behavioral problems, confusion, decreased concentration, hallucinations, self-injury, sleep disturbance and suicidal ideas. The patient is nervous/anxious. The patient is not hyperactive.   ? ?Per HPI unless specifically indicated above ? ?   ?Objective:  ?  ?BP (!) 142/83   Pulse (!) 101   Temp 98.2 ?F (36.8 ?C)   Wt 261 lb 11.2 oz (118.7 kg)   LMP  (LMP Unknown)   SpO2 97%   BMI 44.92 kg/m?   ?Wt Readings from Last 3 Encounters:  ?04/07/22 261 lb 11.2 oz (118.7 kg)  ?05/11/21 234 lb 9.6 oz (106.4 kg)  ?03/29/21 233 lb  9.6 oz (106 kg)  ?  ?Physical Exam ?Vitals and nursing note reviewed.  ?Constitutional:   ?   General: She is not in acute distress. ?   Appearance: Normal appearance. She is obese. She is not ill-appearing, toxic-appearing or diaphoretic.  ?HENT:  ?   Head: Normocephalic and atraumatic.  ?   Right Ear: External ear normal.  ?   Left Ear: External ear normal.  ?   Nose: Nose normal.  ?   Mouth/Throat:  ?   Mouth: Mucous membranes are moist.  ?   Pharynx: Oropharynx is clear.  ?Eyes:  ?   General: No scleral icterus.    ?   Right eye: No discharge.     ?   Left eye: No discharge.  ?   Extraocular Movements: Extraocular movements intact.  ?   Conjunctiva/sclera: Conjunctivae normal.  ?   Pupils: Pupils are equal, round, and reactive to light.  ?Cardiovascular:  ?   Rate and Rhythm: Normal rate and regular rhythm.  ?   Pulses: Normal pulses.  ?   Heart sounds: Normal heart sounds. No murmur heard. ?  No friction rub. No gallop.  ?Pulmonary:  ?   Effort: Pulmonary effort is normal. No respiratory distress.  ?   Breath sounds: Normal breath sounds. No stridor. No wheezing, rhonchi or rales.  ?Chest:  ?   Chest wall: No tenderness.  ?Musculoskeletal:     ?   General: Normal range of motion.  ?   Cervical back: Normal range of motion and neck supple.  ?Skin: ?   General: Skin is warm and dry.  ?   Capillary Refill: Capillary refill takes less than 2 seconds.  ?   Coloration: Skin is not jaundiced or pale.  ?   Findings: No bruising, erythema, lesion or rash.  ?Neurological:  ?   General: No focal deficit present.  ?   Mental Status: She is alert and oriented to person, place, and time. Mental status is at baseline.  ?Psychiatric:     ?   Mood and Affect: Mood normal.     ?   Behavior: Behavior normal.     ?   Thought Content: Thought content normal.     ?   Judgment: Judgment normal.  ? ? ?Results for orders placed or performed in visit on 05/11/21  ?WET PREP FOR [place], YEAST, CLUE  ? Specimen: Sterile Swab  ? Sterile Swab   ?Result Value Ref Range  ? Trichomonas Exam Positive (A) Negative  ? Yeast Exam Negative Negative  ? Clue Cell Exam Positive (A) Negative  ?CBC with Differential/Platelet  ?Result Value Ref Range  ? WBC 7.1 3.4 - 10.8 x10E3/uL  ? RBC 4.50 3.77 -  5.28 x10E6/uL  ? Hemoglobin 13.5 11.1 - 15.9 g/dL  ? Hematocrit 40.3 34.0 - 46.6 %  ? MCV 90 79 - 97 fL  ? MCH 30.0 26.6 - 33.0 pg  ? MCHC 33.5 31.5 - 35.7 g/dL  ? RDW 12.5 11.7 - 15.4 %  ? Platelets 187 150 - 450 x10E3/uL  ? Neutrophils 54 Not Estab. %  ? Lymphs 36 Not Estab. %  ? Monocytes 8 Not Estab. %  ? Eos 2 Not Estab. %  ? Basos 0 Not Estab. %  ? Neutrophils Absolute 3.8 1.4 - 7.0 x10E3/uL  ? Lymphocytes Absolute 2.5 0.7 - 3.1 x10E3/uL  ? Monocytes Absolute 0.6 0.1 - 0.9 x10E3/uL  ? EOS (ABSOLUTE) 0.1 0.0 - 0.4 x10E3/uL  ? Basophils Absolute 0.0 0.0 - 0.2 x10E3/uL  ? Immature Granulocytes 0 Not Estab. %  ? Immature Grans (Abs) 0.0 0.0 - 0.1 x10E3/uL  ?Comprehensive metabolic panel  ?Result Value Ref Range  ? Glucose 130 (H) 65 - 99 mg/dL  ? BUN 16 6 - 24 mg/dL  ? Creatinine, Ser 0.76 0.57 - 1.00 mg/dL  ? eGFR 97 >59 mL/min/1.73  ? BUN/Creatinine Ratio 21 9 - 23  ? Sodium 143 134 - 144 mmol/L  ? Potassium 4.2 3.5 - 5.2 mmol/L  ? Chloride 105 96 - 106 mmol/L  ? CO2 22 20 - 29 mmol/L  ? Calcium 9.5 8.7 - 10.2 mg/dL  ? Total Protein 7.4 6.0 - 8.5 g/dL  ? Albumin 4.5 3.8 - 4.8 g/dL  ? Globulin, Total 2.9 1.5 - 4.5 g/dL  ? Albumin/Globulin Ratio 1.6 1.2 - 2.2  ? Bilirubin Total 0.4 0.0 - 1.2 mg/dL  ? Alkaline Phosphatase 72 44 - 121 IU/L  ? AST 17 0 - 40 IU/L  ? ALT 13 0 - 32 IU/L  ?Lipid Panel w/o Chol/HDL Ratio  ?Result Value Ref Range  ? Cholesterol, Total 233 (H) 100 - 199 mg/dL  ? Triglycerides 92 0 - 149 mg/dL  ? HDL 55 >39 mg/dL  ? VLDL Cholesterol Cal 16 5 - 40 mg/dL  ? LDL Chol Calc (NIH) 162 (H) 0 - 99 mg/dL  ?Urinalysis, Routine w reflex microscopic  ?Result Value Ref Range  ? Specific Gravity, UA 1.020 1.005 - 1.030  ? pH, UA 6.0 5.0 - 7.5  ? Color, UA Yellow  Yellow  ? Appearance Ur Hazy (A) Clear  ? Leukocytes,UA Negative Negative  ? Protein,UA Negative Negative/Trace  ? Glucose, UA Negative Negative  ? Ketones, UA Negative Negative  ? RBC, UA Negative Negative  ? Bilirubi

## 2022-04-07 NOTE — Assessment & Plan Note (Signed)
Will start effexor. Call with any concerns. Recheck 1 month.  ?

## 2022-04-08 ENCOUNTER — Telehealth: Payer: Self-pay | Admitting: Family Medicine

## 2022-04-08 NOTE — Telephone Encounter (Signed)
Copied from [place] [phone]. Topic: General - Other ?>> Apr 08, 2022  1:14 PM Pawlus, Brayton Layman A wrote: ?Reason for CRM: Pt stated she was just seen 4/13, pt stated nothing was sent in for her Hot Flashes as discussed during OV,  pt would like a call back. ?

## 2022-04-08 NOTE — Telephone Encounter (Signed)
Called to inform patient that Dr. Wynetta Emery send in Effexor for her hot flashes and she is to take 1 cap daily with breakfast.  ?

## 2022-05-12 ENCOUNTER — Encounter: Payer: BC Managed Care – PPO | Admitting: Family Medicine

## 2022-05-18 ENCOUNTER — Encounter: Payer: BC Managed Care – PPO | Admitting: Family Medicine

## 2022-07-29 ENCOUNTER — Other Ambulatory Visit: Payer: Self-pay | Admitting: Family Medicine

## 2022-07-29 NOTE — Telephone Encounter (Signed)
Called patient to schedule an appt and she stated she would call back

## 2022-07-29 NOTE — Telephone Encounter (Signed)
Patient is overdue for appointment. Please call to schedule and then route to provider for refill.

## 2022-07-29 NOTE — Telephone Encounter (Signed)
Requested medication (s) are due for refill today: yes  Requested medication (s) are on the active medication list: yes  Last refill:  04/07/22 #90/1  Future visit scheduled: no  Notes to clinic:  Unable to refill per protocol due to failed labs, no updated results.      Requested Prescriptions  Pending Prescriptions Disp Refills   hydrOXYzine (ATARAX) 25 MG tablet [Pharmacy Med Name: HYDROXYZINE HCL 25 MG TAB] 90 tablet 1    Sig: TAKE 1 TABLET BY MOUTH EVERY 8 HOURS AS NEEDED FOR ANXIETY     Ear, Nose, and Throat:  Antihistamines 2 Failed - 07/29/2022 10:48 AM      Failed - Cr in normal range and within 360 days    Creatinine  Date Value Ref Range Status  04/24/2015 0.61 mg/dL Final    Comment:    0.44-1.00 NOTE: New Reference Range  03/03/15    Creatinine, Ser  Date Value Ref Range Status  05/11/2021 0.76 0.57 - 1.00 mg/dL Final         Passed - Valid encounter within last 12 months    Recent Outpatient Visits           3 months ago Acute right-sided low back pain without sciatica   [place], Megan P, DO   1 year ago Routine general medical examination at a health care facility   [place], [place], DO   1 year ago Acute anxiety   [place], [place], DO   1 year ago Morbid obesity [facility])   [place], Megan P, DO   1 year ago Acute pain of right knee   [place], Megan P, DO

## 2023-04-27 ENCOUNTER — Other Ambulatory Visit: Payer: Self-pay | Admitting: Family Medicine

## 2023-04-28 NOTE — Telephone Encounter (Signed)
Requested medication (s) are due for refill today: yes  Requested medication (s) are on the active medication list: yes  Last refill:  04/07/22 #180 1 RF  Future visit scheduled: no  Notes to clinic:  called pt to schedule appt- unable to LM  VM full.   Requested Prescriptions  Pending Prescriptions Disp Refills   etodolac (LODINE) 500 MG tablet [Pharmacy Med Name: ETODOLAC 500 MG TAB] 180 tablet 1    Sig: TAKE 1 TABLET BY MOUTH TWICE DAILY     Analgesics:  NSAIDS Failed - 04/27/2023  1:40 PM      Failed - Manual Review: Labs are only required if the patient has taken medication for more than 8 weeks.      Failed - Cr in normal range and within 360 days    Creatinine  Date Value Ref Range Status  04/24/2015 0.61 mg/dL Final    Comment:    4.09-8.11 NOTE: New Reference Range  03/03/15    Creatinine, Ser  Date Value Ref Range Status  05/11/2021 0.76 0.57 - 1.00 mg/dL Final         Failed - HGB in normal range and within 360 days    Hemoglobin  Date Value Ref Range Status  05/11/2021 13.5 11.1 - 15.9 g/dL Final         Failed - PLT in normal range and within 360 days    Platelets  Date Value Ref Range Status  05/11/2021 187 150 - 450 x10E3/uL Final         Failed - HCT in normal range and within 360 days    Hematocrit  Date Value Ref Range Status  05/11/2021 40.3 34.0 - 46.6 % Final         Failed - eGFR is 30 or above and within 360 days    EGFR (African American)  Date Value Ref Range Status  04/24/2015 >60  Final   GFR calc Af Amer  Date Value Ref Range Status  04/07/2020 124 >59 mL/min/1.73 Final   EGFR (Non-African Amer.)  Date Value Ref Range Status  04/24/2015 >60  Final    Comment:    eGFR values <64mL/min/1.73 m2 may be an indication of chronic kidney disease (CKD). Calculated eGFR is useful in patients with stable renal function. The eGFR calculation will not be reliable in acutely ill patients when serum creatinine is changing rapidly. It is  not useful in patients on dialysis. The eGFR calculation may not be applicable to patients at the low and high extremes of body sizes, pregnant women, and vegetarians.    GFR calc non Af Amer  Date Value Ref Range Status  04/07/2020 107 >59 mL/min/1.73 Final   eGFR  Date Value Ref Range Status  05/11/2021 97 >59 mL/min/1.73 Final         Failed - Valid encounter within last 12 months    Recent Outpatient Visits           1 year ago Acute right-sided low back pain without sciatica   [place] [facility] [place], Oralia Rud, DO   1 year ago Routine general medical examination at a health care facility   [facility] [place], [place] P, DO   2 years ago Acute anxiety   [place] [facility] [place], [place] P, DO   2 years ago Morbid obesity [facility])   [place] [facility] [place], Megan P, DO   2 years ago Acute pain of  right knee   [place] [facility] [place], [place], [place]              Passed - Patient is not pregnant

## 2023-05-12 ENCOUNTER — Other Ambulatory Visit: Payer: Self-pay | Admitting: Family Medicine

## 2023-05-12 NOTE — Telephone Encounter (Signed)
I called pt to schedule her for yearly physical as last one was 2 yrs ago today 05/11/2021.   Had an acute visit on 04/07/2022.  I let her know she needed an appt in order for the Lodine 500 mg could be refilled. She said,   "I have to have that prescription refilled".    "I need enough to get me through the weekend".    "I'm driving to [place] this weekend and my knee will never make that long drive without it".    I let her know since it had been a while since she was seen that's why it wasn't refilled on the first request.   She needs an appt.     "I've got to have that filled at least enough to get me through the weekend".    "Check with Aundra Millet again and see if she will give me enough for the weekend".    "I'll make an appt but I need that for this weekend". I let her know I would resend the request to Dr. Laural Benes.

## 2023-05-16 NOTE — Telephone Encounter (Signed)
Called and scheduled appointment for 05/29/2023 @ 3:00 pm.

## 2023-05-16 NOTE — Telephone Encounter (Signed)
Needs an appointment. Will get her enough medicine to make it to appointment when it's booked.   

## 2023-05-29 ENCOUNTER — Ambulatory Visit: Payer: BC Managed Care – PPO | Admitting: Family Medicine

## 2023-06-08 ENCOUNTER — Ambulatory Visit (INDEPENDENT_AMBULATORY_CARE_PROVIDER_SITE_OTHER): Payer: BC Managed Care – PPO | Admitting: Family Medicine

## 2023-06-08 ENCOUNTER — Encounter: Payer: Self-pay | Admitting: Family Medicine

## 2023-06-08 VITALS — BP 132/88 | HR 88 | Temp 99.4°F | Ht 64.5 in | Wt 261.4 lb

## 2023-06-08 DIAGNOSIS — F419 Anxiety disorder, unspecified: Secondary | ICD-10-CM | POA: Diagnosis not present

## 2023-06-08 DIAGNOSIS — Z1211 Encounter for screening for malignant neoplasm of colon: Secondary | ICD-10-CM | POA: Diagnosis not present

## 2023-06-08 DIAGNOSIS — N898 Other specified noninflammatory disorders of vagina: Secondary | ICD-10-CM | POA: Diagnosis not present

## 2023-06-08 DIAGNOSIS — Z1231 Encounter for screening mammogram for malignant neoplasm of breast: Secondary | ICD-10-CM

## 2023-06-08 DIAGNOSIS — Z0000 Encounter for general adult medical examination without abnormal findings: Secondary | ICD-10-CM

## 2023-06-08 DIAGNOSIS — Z23 Encounter for immunization: Secondary | ICD-10-CM | POA: Diagnosis not present

## 2023-06-08 LAB — MICROSCOPIC EXAMINATION
Bacteria, UA: NONE SEEN
WBC, UA: NONE SEEN /hpf (ref 0–5)

## 2023-06-08 LAB — URINALYSIS, ROUTINE W REFLEX MICROSCOPIC
Bilirubin, UA: NEGATIVE
Glucose, UA: NEGATIVE
Ketones, UA: NEGATIVE
Leukocytes,UA: NEGATIVE
Nitrite, UA: NEGATIVE
Specific Gravity, UA: 1.025 (ref 1.005–1.030)
Urobilinogen, Ur: 0.2 mg/dL (ref 0.2–1.0)
pH, UA: 5.5 (ref 5.0–7.5)

## 2023-06-08 LAB — WET PREP FOR TRICH, YEAST, CLUE
Clue Cell Exam: POSITIVE — AB
Trichomonas Exam: NEGATIVE
Yeast Exam: NEGATIVE

## 2023-06-08 MED ORDER — METRONIDAZOLE 500 MG PO TABS: 500.0000 mg | ORAL_TABLET | Freq: Two times a day (BID) | ORAL | 0 refills | Status: DC

## 2023-06-08 MED ORDER — DULOXETINE HCL 20 MG PO CPEP
20.0000 mg | ORAL_CAPSULE | Freq: Every day | ORAL | 2 refills | Status: DC
Start: 2023-06-08 — End: 2023-10-10

## 2023-06-08 MED ORDER — CYCLOBENZAPRINE HCL 10 MG PO TABS: 10.0000 mg | ORAL_TABLET | Freq: Every day | ORAL | 0 refills | Status: DC

## 2023-06-08 MED ORDER — TRAZODONE HCL 50 MG PO TABS: 25.0000 mg | ORAL_TABLET | Freq: Every evening | ORAL | 2 refills | Status: DC | PRN

## 2023-06-08 NOTE — Patient Instructions (Signed)
Patient is scheduled for Mammogram at [facility] at [place] on 06/15/23 at 2:40 PM. If patient has any questions or concerns regarding appointment. Patient is to reach out to their facility directly at [phone].

## 2023-06-08 NOTE — Progress Notes (Signed)
BP 132/88   Pulse 88   Temp 99.4 F (37.4 C) (Oral)   Ht 5' 4.5" (1.638 m)   Wt 261 lb 6.4 oz (118.6 kg)   LMP  (LMP Unknown)   SpO2 97%   BMI 44.18 kg/m    Subjective:    Patient ID: Amanda Moran, female    DOB: August 17, 1971, 52 y.o.   MRN: [id number]  HPI: Amanda Moran is a 52 y.o. female presenting on 06/08/2023 for comprehensive medical examination. Current medical complaints include:  DEPRESSION Duration: about a year and a half Mood status: uncontrolled Satisfied with current treatment?: no Symptom severity: moderate  Duration of current treatment : not on anything Side effects: yes- nausea Medication compliance: poor compliance Psychotherapy/counseling: no  Previous psychiatric medications: effexor Depressed mood: yes Anxious mood: yes Anhedonia: yes Significant weight loss or gain: no Insomnia: yes hard to fall asleep Fatigue: yes Feelings of worthlessness or guilt: yes Impaired concentration/indecisiveness: yes Suicidal ideations: no Hopelessness: no Crying spells: yes    06/08/2023    8:41 AM 05/11/2021   10:08 AM 03/29/2021    2:51 PM 07/16/2020    4:24 PM 05/16/2019    3:39 PM  Depression screen PHQ 2/9  Decreased Interest 3 0 0 0 1  Down, Depressed, Hopeless 3 0 0 0 0  PHQ - 2 Score 6 0 0 0 1  Altered sleeping 3 3   1   Tired, decreased energy 3 2   1   Change in appetite 3 1   2   Feeling bad or failure about yourself  2 0   0  Trouble concentrating 1 0   1  Moving slowly or fidgety/restless 1 0   0  Suicidal thoughts 0 0   0  PHQ-9 Score 19 6   6   Difficult doing work/chores Very difficult Not difficult at all   Somewhat difficult    Menopausal Symptoms: yes  Depression Screen done today and results listed below:     06/08/2023    8:41 AM 05/11/2021   10:08 AM 03/29/2021    2:51 PM 07/16/2020    4:24 PM 05/16/2019    3:39 PM  Depression screen PHQ 2/9  Decreased Interest 3 0 0 0 1  Down, Depressed, Hopeless 3 0 0 0 0  PHQ - 2 Score 6 0 0 0  1  Altered sleeping 3 3   1   Tired, decreased energy 3 2   1   Change in appetite 3 1   2   Feeling bad or failure about yourself  2 0   0  Trouble concentrating 1 0   1  Moving slowly or fidgety/restless 1 0   0  Suicidal thoughts 0 0   0  PHQ-9 Score 19 6   6   Difficult doing work/chores Very difficult Not difficult at all   Somewhat difficult    Past Medical History:  Past Medical History:  Diagnosis Date   Abnormal uterine bleeding 05/20/2015   Overview:  Hgb 7.9 on 5/19 preoperatively. At [facility] preop.    Acanthosis nigricans    Anemia    Anxiety    Diabetes mellitus without complication (HCC)    patient unaware   Fibroids    Fibroids    Headache    Iron deficiency anemia     Surgical History:  Past Surgical History:  Procedure Laterality Date   ABDOMINAL HYSTERECTOMY  05/29/2015   Procedure: HYSTERECTOMY ABDOMINAL;  Surgeon: Wille Celeste, MD;  Location:  [facility];  Service: Gynecology;;   ACHILLES TENDON SURGERY Left 06/07/2019   Procedure: ACHILLES REPAIR SECONDARY LEFT;  Surgeon: Gwyneth Revels, DPM;  Location: [facility];  Service: Podiatry;  Laterality: Left;   ACHILLES TENDON SURGERY Right 08/30/2019   Procedure: ACHILLES REPAIR SECONDARY RIGHT;  Surgeon: Gwyneth Revels, DPM;  Location: [facility];  Service: Podiatry;  Laterality: Right;   BREAST BIOPSY Left 10/04/2017   [place] bx /clip-neg   CALCANEAL OSTEOTOMY Right 08/30/2019   Procedure: HAGLUNDS/RETROCALCANEAL (OSTECTOMY) RIGHT;  Surgeon: Gwyneth Revels, DPM;  Location: [facility];  Service: Podiatry;  Laterality: Right;   CHOLECYSTECTOMY     KNEE ARTHROSCOPY Right    KNEE ARTHROSCOPY WITH ANTERIOR CRUCIATE LIGAMENT (ACL) REPAIR WITH HAMSTRING GRAFT Right 02/14/2017   Procedure: KNEE ARTHROSCOPY WITH ANTERIOR CRUCIATE LIGAMENT (ACL) REPAIR WITH HAMSTRING GRAFT;  Surgeon: Juanell Fairly, MD;  Location: [facility];  Service: Orthopedics;  Laterality: Right;   LAPAROSCOPIC SUPRACERVICAL HYSTERECTOMY N/A 05/29/2015    Procedure: LAPAROSCOPIC SUPRACERVICAL HYSTERECTOMY;  Surgeon: Wille Celeste, MD;  Location: [facility];  Service: Gynecology;  Laterality: N/A;  attempted   OSTECTOMY Left 06/07/2019   Procedure: HAGLUNDS/RETROCALCANEAL OSTECTOMY;  Surgeon: Gwyneth Revels, DPM;  Location: [facility];  Service: Podiatry;  Laterality: Left;   TUBAL LIGATION      Medications:  Current Outpatient Medications on File Prior to Visit  Medication Sig   etodolac (LODINE) 500 MG tablet TAKE 1 TABLET BY MOUTH TWICE DAILY   hydrOXYzine (ATARAX) 25 MG tablet TAKE 1 TABLET BY MOUTH EVERY 8 HOURS AS NEEDED FOR ANXIETY   No current facility-administered medications on file prior to visit.    Allergies:  No Known Allergies  Social History:  Social History   Socioeconomic History   Marital status: Married    Spouse name: Not on file   Number of children: Not on file   Years of education: Not on file   Highest education level: Not on file  Occupational History   Not on file  Tobacco Use   Smoking status: Every Day    Packs/day: 1    Types: Cigarettes   Smokeless tobacco: Never  Vaping Use   Vaping Use: Never used  Substance and Sexual Activity   Alcohol use: Yes    Comment: socially   Drug use: No   Sexual activity: Yes  Other Topics Concern   Not on file  Social History Narrative   Not on file   Social Determinants of Health   Financial Resource Strain: Not on file  Food Insecurity: Not on file  Transportation Needs: Not on file  Physical Activity: Not on file  Stress: Not on file  Social Connections: Not on file  Intimate Partner Violence: Not on file   Social History   Tobacco Use  Smoking Status Every Day   Packs/day: 1   Types: Cigarettes  Smokeless Tobacco Never   Social History   Substance and Sexual Activity  Alcohol Use Yes   Comment: socially    Family History:  Family History  Problem Relation Age of Onset   Alcohol abuse Father    Arthritis Father    Diabetes  Father    Drug abuse Father    Cancer Father        pancreatic   Diabetes Brother    Cancer Mother        cervical   Breast cancer Maternal Aunt    Breast cancer Cousin        mat cousins  Past medical history, surgical history, medications, allergies, family history and social history reviewed with patient today and changes made to appropriate areas of the chart.   Review of Systems  Constitutional:  Positive for diaphoresis. Negative for chills, fever, malaise/fatigue and weight loss.  HENT: Negative.    Eyes:  Positive for blurred vision. Negative for double vision, photophobia, pain, discharge and redness.  Respiratory: Negative.    Cardiovascular:  Positive for leg swelling. Negative for chest pain, palpitations, orthopnea, claudication and PND.  Gastrointestinal: Negative.   Genitourinary: Negative.   Musculoskeletal:  Positive for back pain and joint pain. Negative for falls, myalgias and neck pain.  Skin: Negative.   Neurological: Negative.   Endo/Heme/Allergies:  Positive for environmental allergies. Negative for polydipsia. Does not bruise/bleed easily.  Psychiatric/Behavioral:  Positive for depression. Negative for hallucinations, memory loss, substance abuse and suicidal ideas. The patient is nervous/anxious and has insomnia.    All other ROS negative except what is listed above and in the HPI.      Objective:    BP 132/88   Pulse 88   Temp 99.4 F (37.4 C) (Oral)   Ht 5' 4.5" (1.638 m)   Wt 261 lb 6.4 oz (118.6 kg)   LMP  (LMP Unknown)   SpO2 97%   BMI 44.18 kg/m   Wt Readings from Last 3 Encounters:  06/08/23 261 lb 6.4 oz (118.6 kg)  04/07/22 261 lb 11.2 oz (118.7 kg)  05/11/21 234 lb 9.6 oz (106.4 kg)    Physical Exam Vitals and nursing note reviewed.  Constitutional:      General: She is not in acute distress.    Appearance: Normal appearance. She is obese. She is not ill-appearing, toxic-appearing or diaphoretic.  HENT:     Head: Normocephalic  and atraumatic.     Right Ear: Tympanic membrane, ear canal and external ear normal. There is no impacted cerumen.     Left Ear: Tympanic membrane, ear canal and external ear normal. There is no impacted cerumen.     Nose: Nose normal. No congestion or rhinorrhea.     Mouth/Throat:     Mouth: Mucous membranes are moist.     Pharynx: Oropharynx is clear. No oropharyngeal exudate or posterior oropharyngeal erythema.  Eyes:     General: No scleral icterus.       Right eye: No discharge.        Left eye: No discharge.     Extraocular Movements: Extraocular movements intact.     Conjunctiva/sclera: Conjunctivae normal.     Pupils: Pupils are equal, round, and reactive to light.  Neck:     Vascular: No carotid bruit.  Cardiovascular:     Rate and Rhythm: Normal rate and regular rhythm.     Pulses: Normal pulses.     Heart sounds: No murmur heard.    No friction rub. No gallop.  Pulmonary:     Effort: Pulmonary effort is normal. No respiratory distress.     Breath sounds: Normal breath sounds. No stridor. No wheezing, rhonchi or rales.  Chest:     Chest wall: No tenderness.  Abdominal:     General: Abdomen is flat. Bowel sounds are normal. There is no distension.     Palpations: Abdomen is soft. There is no mass.     Tenderness: There is no abdominal tenderness. There is no right CVA tenderness, left CVA tenderness, guarding or rebound.     Hernia: No hernia is present.  Genitourinary:  Comments: Breast and pelvic exams deferred with shared decision making Musculoskeletal:        General: No swelling, tenderness, deformity or signs of injury.     Cervical back: Normal range of motion and neck supple. No rigidity. No muscular tenderness.     Right lower leg: No edema.     Left lower leg: No edema.  Lymphadenopathy:     Cervical: No cervical adenopathy.  Skin:    General: Skin is warm and dry.     Capillary Refill: Capillary refill takes less than 2 seconds.     Coloration: Skin  is not jaundiced or pale.     Findings: No bruising, erythema, lesion or rash.  Neurological:     General: No focal deficit present.     Mental Status: She is alert and oriented to person, place, and time. Mental status is at baseline.     Cranial Nerves: No cranial nerve deficit.     Sensory: No sensory deficit.     Motor: No weakness.     Coordination: Coordination normal.     Gait: Gait normal.     Deep Tendon Reflexes: Reflexes normal.  Psychiatric:        Mood and Affect: Mood normal.        Behavior: Behavior normal.        Thought Content: Thought content normal.        Judgment: Judgment normal.     Results for orders placed or performed in visit on 06/08/23  WET PREP FOR TRICH, YEAST, CLUE   Specimen: Sterile Swab   Sterile Swab  Result Value Ref Range   Trichomonas Exam Negative Negative   Yeast Exam Negative Negative   Clue Cell Exam Positive (A) Negative  Microscopic Examination   Urine  Result Value Ref Range   WBC, UA None seen 0 - 5 /hpf   RBC, Urine 0-2 0 - 2 /hpf   Epithelial Cells (non renal) >10E 0 - 10 /hpf   Mucus, UA Present (A) Not Estab.   Bacteria, UA None seen None seen/Few  Urinalysis, Routine w reflex microscopic  Result Value Ref Range   Specific Gravity, UA 1.025 1.005 - 1.030   pH, UA 5.5 5.0 - 7.5   Color, UA Yellow Yellow   Appearance Ur Cloudy (A) Clear   Leukocytes,UA Negative Negative   Protein,UA 1+ (A) Negative/Trace   Glucose, UA Negative Negative   Ketones, UA Negative Negative   RBC, UA Trace (A) Negative   Bilirubin, UA Negative Negative   Urobilinogen, Ur 0.2 0.2 - 1.0 mg/dL   Nitrite, UA Negative Negative   Microscopic Examination See below:       Assessment & Plan:   Problem List Items Addressed This Visit       Other   Acute anxiety    Not under good control. Will start her on cymbalta and recheck in about 6 weeks. Call with any concerns. Continue to monitor.       Relevant Medications   DULoxetine (CYMBALTA)  20 MG capsule   traZODone (DESYREL) 50 MG tablet   Other Visit Diagnoses     Routine general medical examination at a health care facility    -  Primary   Vaccines up to date. Screening labs checked today. Mammogram and colonoscopy ordered today. Continue diet and exercise. Call with any concerns.   Relevant Orders   CBC with Differential/Platelet   Comprehensive metabolic panel   Lipid Panel w/o Chol/HDL Ratio  Urinalysis, Routine w reflex microscopic (Completed)   TSH   Vaginal discharge       +BV. Will treat.   Relevant Orders   WET PREP FOR TRICH, YEAST, CLUE (Completed)   Encounter for screening mammogram for malignant neoplasm of breast       Mammogram ordered today.   Relevant Orders   MM 3D SCREENING MAMMOGRAM BILATERAL BREAST   Screening for colon cancer       Referral to GI placed today.   Relevant Orders   Ambulatory referral to Gastroenterology        Follow up plan: Return in about 6 weeks (around 07/20/2023).   LABORATORY TESTING:  - Pap smear: not applicable  IMMUNIZATIONS:   - Tdap: Tetanus vaccination status reviewed: last tetanus booster within 10 years. - Influenza: Postponed to flu season - Pneumovax: Not applicable - Prevnar: Not applicable - COVID: Up to date - HPV: Not applicable - Shingrix vaccine: Administered today  SCREENING: -Mammogram: Ordered today  - Colonoscopy: Ordered today   PATIENT COUNSELING:   Advised to take 1 mg of folate supplement per day if capable of pregnancy.   Sexuality: Discussed sexually transmitted diseases, partner selection, use of condoms, avoidance of unintended pregnancy  and contraceptive alternatives.   Advised to avoid cigarette smoking.  I discussed with the patient that most people either abstain from alcohol or drink within safe limits (<=14/week and <=4 drinks/occasion for males, <=7/weeks and <= 3 drinks/occasion for females) and that the risk for alcohol disorders and other health effects rises  proportionally with the number of drinks per week and how often a drinker exceeds daily limits.  Discussed cessation/primary prevention of drug use and availability of treatment for abuse.   Diet: Encouraged to adjust caloric intake to maintain  or achieve ideal body weight, to reduce intake of dietary saturated fat and total fat, to limit sodium intake by avoiding high sodium foods and not adding table salt, and to maintain adequate dietary potassium and calcium preferably from fresh fruits, vegetables, and low-fat dairy products.    stressed the importance of regular exercise  Injury prevention: Discussed safety belts, safety helmets, smoke detector, smoking near bedding or upholstery.   Dental health: Discussed importance of regular tooth brushing, flossing, and dental visits.    NEXT PREVENTATIVE PHYSICAL DUE IN 1 YEAR. Return in about 6 weeks (around 07/20/2023).

## 2023-06-08 NOTE — Assessment & Plan Note (Signed)
Not under good control. Will start her on cymbalta and recheck in about 6 weeks. Call with any concerns. Continue to monitor.

## 2023-06-09 ENCOUNTER — Ambulatory Visit: Payer: BC Managed Care – PPO | Admitting: Family Medicine

## 2023-06-09 LAB — LIPID PANEL W/O CHOL/HDL RATIO
Cholesterol, Total: 241 mg/dL — ABNORMAL HIGH (ref 100–199)
HDL: 49 mg/dL (ref >39)
LDL Chol Calc (NIH): 176 mg/dL — ABNORMAL HIGH (ref 0–99)
Triglycerides: 91 mg/dL (ref 0–149)
VLDL Cholesterol Cal: 16 mg/dL (ref 5–40)

## 2023-06-09 LAB — CBC WITH DIFFERENTIAL/PLATELET
Basophils Absolute: 0 10*3/uL (ref 0.0–0.2)
Basos: 0 %
EOS (ABSOLUTE): 0 10*3/uL (ref 0.0–0.4)
Eos: 0 %
Hematocrit: 42.1 % (ref 34.0–46.6)
Hemoglobin: 13.8 g/dL (ref 11.1–15.9)
Immature Grans (Abs): 0 10*3/uL (ref 0.0–0.1)
Immature Granulocytes: 0 %
Lymphocytes Absolute: 1.6 10*3/uL (ref 0.7–3.1)
Lymphs: 30 %
MCH: 29.2 pg (ref 26.6–33.0)
MCHC: 32.8 g/dL (ref 31.5–35.7)
MCV: 89 fL (ref 79–97)
Monocytes Absolute: 0.5 10*3/uL (ref 0.1–0.9)
Monocytes: 10 %
Neutrophils Absolute: 3.3 10*3/uL (ref 1.4–7.0)
Neutrophils: 60 %
Platelets: 171 10*3/uL (ref 150–450)
RBC: 4.73 x10E6/uL (ref 3.77–5.28)
RDW: 14.2 % (ref 11.7–15.4)
WBC: 5.5 10*3/uL (ref 3.4–10.8)

## 2023-06-09 LAB — COMPREHENSIVE METABOLIC PANEL
ALT: 18 IU/L (ref 0–32)
AST: 17 IU/L (ref 0–40)
Albumin/Globulin Ratio: 1.4
Albumin: 4.3 g/dL (ref 3.9–4.9)
Alkaline Phosphatase: 70 IU/L (ref 44–121)
BUN/Creatinine Ratio: 11 (ref 9–23)
BUN: 8 mg/dL (ref 6–24)
Bilirubin Total: 0.4 mg/dL (ref 0.0–1.2)
CO2: 21 mmol/L (ref 20–29)
Calcium: 9.2 mg/dL (ref 8.7–10.2)
Chloride: 106 mmol/L (ref 96–106)
Creatinine, Ser: 0.76 mg/dL (ref 0.57–1.00)
Globulin, Total: 3 g/dL (ref 1.5–4.5)
Glucose: 141 mg/dL — ABNORMAL HIGH (ref 70–99)
Potassium: 3.6 mmol/L (ref 3.5–5.2)
Sodium: 142 mmol/L (ref 134–144)
Total Protein: 7.3 g/dL (ref 6.0–8.5)
eGFR: 95 mL/min/{1.73_m2} (ref >59)

## 2023-06-09 LAB — TSH: TSH: 1.11 u[IU]/mL (ref 0.450–4.500)

## 2023-06-12 ENCOUNTER — Telehealth: Payer: Self-pay

## 2023-06-12 DIAGNOSIS — Z1211 Encounter for screening for malignant neoplasm of colon: Secondary | ICD-10-CM

## 2023-06-12 NOTE — Telephone Encounter (Signed)
Patient requested call back later on this afternoon.  Thanks,  [place], [place]

## 2023-06-13 ENCOUNTER — Other Ambulatory Visit: Payer: Self-pay

## 2023-06-13 DIAGNOSIS — Z1211 Encounter for screening for malignant neoplasm of colon: Secondary | ICD-10-CM

## 2023-06-13 MED ORDER — NA SULFATE-K SULFATE-MG SULF 17.5-3.13-1.6 GM/177ML PO SOLN: 1.0000 | Freq: Once | ORAL | 0 refills | Status: AC

## 2023-06-13 NOTE — Addendum Note (Signed)
Addended by: Avie Arenas on: 06/13/2023 03:35 PM   Modules accepted: Orders

## 2023-06-13 NOTE — Telephone Encounter (Signed)
Gastroenterology Pre-Procedure Review  Request Date: 07/12/23 Requesting Physician: Dr. Servando Snare  PATIENT REVIEW QUESTIONS: The patient responded to the following health history questions as indicated:    1. Are you having any GI issues? no 2. Do you have a personal history of Polyps? no 3. Do you have a family history of Colon Cancer or Polyps? no 4. Diabetes Mellitus? no 5. Joint replacements in the past 12 months?no 6. Major health problems in the past 3 months?no 7. Any artificial heart valves, MVP, or defibrillator?no    MEDICATIONS & ALLERGIES:    Patient reports the following regarding taking any anticoagulation/antiplatelet therapy:   Plavix, Coumadin, Eliquis, Xarelto, Lovenox, Pradaxa, Brilinta, or Effient? no Aspirin? no  Patient confirms/reports the following medications:  Current Outpatient Medications  Medication Sig Dispense Refill   cyclobenzaprine (FLEXERIL) 10 MG tablet Take 1 tablet (10 mg total) by mouth at bedtime. 30 tablet 0   DULoxetine (CYMBALTA) 20 MG capsule Take 1 capsule (20 mg total) by mouth daily. 30 capsule 2   etodolac (LODINE) 500 MG tablet TAKE 1 TABLET BY MOUTH TWICE DAILY 30 tablet 0   hydrOXYzine (ATARAX) 25 MG tablet TAKE 1 TABLET BY MOUTH EVERY 8 HOURS AS NEEDED FOR ANXIETY 90 tablet 1   metroNIDAZOLE (FLAGYL) 500 MG tablet Take 1 tablet (500 mg total) by mouth 2 (two) times daily. 14 tablet 0   traZODone (DESYREL) 50 MG tablet Take 0.5-1 tablets (25-50 mg total) by mouth at bedtime as needed for sleep. 30 tablet 2   No current facility-administered medications for this visit.    Patient confirms/reports the following allergies:  No Known Allergies  No orders of the defined types were placed in this encounter.   AUTHORIZATION INFORMATION Primary Insurance: 1D#: Group #:  Secondary Insurance: 1D#: Group #:  SCHEDULE INFORMATION: Date: 07/12/23 Time: Location: [facility]

## 2023-06-15 ENCOUNTER — Ambulatory Visit
Admission: RE | Admit: 2023-06-15 | Discharge: 2023-06-15 | Disposition: A | Discharge status: Home / Self Care | Payer: BC Managed Care – PPO | Source: Ambulatory Visit | Attending: Family Medicine | Admitting: Family Medicine

## 2023-06-15 DIAGNOSIS — Z1231 Encounter for screening mammogram for malignant neoplasm of breast: Secondary | ICD-10-CM | POA: Diagnosis not present

## 2023-06-28 ENCOUNTER — Ambulatory Visit: Payer: Self-pay

## 2023-06-28 ENCOUNTER — Encounter: Payer: Self-pay | Admitting: Family Medicine

## 2023-06-28 NOTE — Telephone Encounter (Signed)
Appt. OK to book with Rashelle or Denny Peon

## 2023-06-28 NOTE — Telephone Encounter (Addendum)
  Chief Complaint: moderate anxiety Symptoms: panic attack this am, self isolation, not taking care of herself (no self care things like daily showers, having nails done) isolation, lack of interesting in things she use to be excited about, flat tone of speech Frequency: past several weeks if not longer - was seen for insomnia Pertinent Negatives: Patient denies Suicidal ideation or plan  Disposition: [] ED /[] Urgent Care (no appt availability in office) / [x] Appointment(In office/virtual)/ []  [facility]/ [] Home Care/ [] Refused Recommended Disposition /[] [facility]/ []  Follow-up with PCP Additional Notes: has appt on Friday Reason for Disposition  MODERATE anxiety (e.g., persistent or frequent anxiety symptoms; interferes with sleep, school, or work)  Answer Assessment - Initial Assessment Questions 1. CONCERN: "Did anything happen that prompted you to call today?"      Increased anxiety  2. ANXIETY SYMPTOMS: "Can you describe how you (your loved one; patient) have been feeling?" (e.g., tense, restless, panicky, anxious, keyed up, overwhelmed, sense of impending doom).      Panicky. Anxious, self isolation, anxiety around crowds, can't control emotions, gets no pleasure from anything anymore  3. ONSET: "How long have you been feeling this way?" (e.g., hours, days, weeks)     3 years ago  but more recent  4. SEVERITY: "How would you rate the level of anxiety?" (e.g., 0 - 10; or mild, moderate, severe).     8-9 5. FUNCTIONAL IMPAIRMENT: "How have these feelings affected your ability to do daily activities?" "Have you had more difficulty than usual doing your normal daily activities?" (e.g., getting better, same, worse; self-care, school, work, interactions)     Not doing self care things showers, new nails - I don't care anymore  6. HISTORY: "Have you felt this way before?" "Have you ever been diagnosed with an anxiety problem in the past?" (e.g., generalized anxiety  disorder, panic attacks, PTSD). If Yes, ask: "How was this problem treated?" (e.g., medicines, counseling, etc.)     Yes- yes, shown how to control the anxiety (breathing exercision, imagery 0-"just not working ny more 7. RISK OF HARM - SUICIDAL IDEATION: "Do you ever have thoughts of hurting or killing yourself?" If Yes, ask:  "Do you have these feelings now?" "Do you have a plan on how you would do this?"     No plan no ideation 8. TREATMENT:  "What has been done so far to treat this anxiety?" (e.g., medicines, relaxation strategies). "What has helped?"     Relaxation strategies, medication 9. TREATMENT - THERAPIST: "Do you have a counselor or therapist? Name?"     no 10. POTENTIAL TRIGGERS: "Do you drink caffeinated beverages (e.g., coffee, colas, teas), and how much daily?" "Do you drink alcohol or use any drugs?" "Have you started any new medicines recently?"       Coffee 1 cup/zero sugar with caffiene 11. PATIENT SUPPORT: "Who is with you now?" "Who do you live with?" "Do you have family or friends who you can talk to?"        Yes has friends does not want talk to /husband Hartley Barefoot 12. OTHER SYMPTOMS: "Do you have any other symptoms?" (e.g., feeling depressed, trouble concentrating, trouble sleeping, trouble breathing, palpitations or fast heartbeat, chest pain, sweating, nausea, or diarrhea)       Feeling depressed, 1 panic attack today, on medication for sleeping, 2 PPD of cigarettes, gummies (THC) x 1 week  Protocols used: Anxiety and Panic Attack-A-AH

## 2023-06-30 ENCOUNTER — Telehealth (INDEPENDENT_AMBULATORY_CARE_PROVIDER_SITE_OTHER): Payer: BC Managed Care – PPO | Admitting: Physician Assistant

## 2023-06-30 ENCOUNTER — Encounter: Payer: Self-pay | Admitting: Physician Assistant

## 2023-06-30 DIAGNOSIS — F419 Anxiety disorder, unspecified: Secondary | ICD-10-CM | POA: Diagnosis not present

## 2023-06-30 MED ORDER — BUSPIRONE HCL 5 MG PO TABS
5.0000 mg | ORAL_TABLET | Freq: Two times a day (BID) | ORAL | 1 refills | Status: DC
Start: 2023-06-30 — End: 2023-10-10

## 2023-06-30 NOTE — Progress Notes (Signed)
Virtual Visit via Video Note  I connected with Amanda Moran on 06/30/23 at  8:20 AM EDT by a video enabled telemedicine application and verified that I am speaking with the correct person using two identifiers.  Location: Patient: at home Provider: [facility], [place], [place]    I discussed the limitations of evaluation and management by telemedicine and the availability of in person appointments. The patient expressed understanding and agreed to proceed.  Chief Complaint  Patient presents with   Panic Attack    Patient says she was in the office on 06/10/23 and was prescribed medications to help with her Anxiety. Patient says she has had three panic attacks just this week. Patient says she was also given Trazodone for rest and says she is getting more rest. Patient says she thinks she has been triggered due it being the first of the month, and she thinks that it may be work related. Patient think that may be the reason being and having a hard time dealing with people for the past few years.     History of Present Illness:   She reports she has had 3 anxiety attacks this week and she is not sure why  She states she had a lot of difficult phone calls at work She reports having a hard time dealing with people and states she doesn't want to leave her house  She reports difficulty controlling her emotions and she is unsure what is going on. She states she has switched to decaf coffee to help   She states she has taken her Hydroxyzine more often this week  She has also been taking Duloxetine   She states the Hydroxyzine makes her sleepy if she takes during the day She was provided with a list of therapy services but has not found one that will accept her insurance yet      06/30/2023    8:17 AM 06/08/2023    8:41 AM 05/11/2021   10:10 AM 05/16/2019    3:41 PM  GAD 7 : Generalized Anxiety Score  Nervous, Anxious, on Edge 3 3 1 1   Control/stop worrying 3 3 1 2   Worry too  much - different things 3 3 1 2   Trouble relaxing 3 3 1 2   Restless 2 3 0 1  Easily annoyed or irritable 3 3 1 1   Afraid - awful might happen 1 0 0 1  Total GAD 7 Score 18 18 5 10   Anxiety Difficulty  Somewhat difficult Extremely difficult Somewhat difficult       Observations/Objective:  Due to the nature of the virtual visit, physical exam and observations are limited. Able to obtain the following observations:   Alert, oriented, x3 Appears comfortable, in no acute distress.  No scleral injection, no appreciated hoarseness, tachypnea, wheeze or strider. Able to maintain conversation without visible strain.  No cough appreciated during visit.    Assessment and Plan:   Follow Up Instructions:    Problem List Items Addressed This Visit       Other   Acute anxiety - Primary    Unsure of chronicity, does not appear well controlled Was previously started on Duloxetine 20 mg pO every day in June- we discussed that this has not had time to reach full therapeutic effects so a dose change is likely not appropriate right now She reports increased social anxiety and developing fear of leaving her home  List of therapy resources was provided at previous apt- encouraged  her to reach out to set up an apt with therapy provider to augment medications Will add Buspar 5 mg PO BID PRN to assist with acute anxiety attacks She can continue Trazodone and Hydroxyzine as needed for respective concerns Reminded her to keep her upcoming apt at the end of the month with PCP to discuss mood and concerns. Follow up as needed for persistent or progressing symptoms        Relevant Medications   busPIRone (BUSPAR) 5 MG tablet   I discussed the assessment and treatment plan with the patient. The patient was provided an opportunity to ask questions and all were answered. The patient agreed with the plan and demonstrated an understanding of the instructions.   The patient was advised to call back or  seek an in-person evaluation if the symptoms worsen or if the condition fails to improve as anticipated.  I provided 11 minutes of non-face-to-face time during this encounter.  No follow-ups on file.   I, Tiffine Henigan E Shareeka Yim, PA-C, have reviewed all documentation for this visit. The documentation on 06/30/23 for the exam, diagnosis, procedures, and orders are all accurate and complete.   Jacquelin Hawking, MHS, PA-C [facility]

## 2023-06-30 NOTE — Assessment & Plan Note (Signed)
Unsure of chronicity, does not appear well controlled Was previously started on Duloxetine 20 mg pO every day in June- we discussed that this has not had time to reach full therapeutic effects so a dose change is likely not appropriate right now She reports increased social anxiety and developing fear of leaving her home  List of therapy resources was provided at previous apt- encouraged her to reach out to set up an apt with therapy provider to augment medications Will add Buspar 5 mg PO BID PRN to assist with acute anxiety attacks She can continue Trazodone and Hydroxyzine as needed for respective concerns Reminded her to keep her upcoming apt at the end of the month with PCP to discuss mood and concerns. Follow up as needed for persistent or progressing symptoms

## 2023-07-03 NOTE — Telephone Encounter (Signed)
Pt already had a virtual appointment on 06/30/2023 with another provider.  Called and patient informed me of this information.

## 2023-07-11 ENCOUNTER — Encounter: Payer: Self-pay | Admitting: Gastroenterology

## 2023-07-12 ENCOUNTER — Encounter
Admission: RE | Disposition: A | Discharge status: Home / Self Care | Payer: Self-pay | Source: Home / Self Care | Attending: Gastroenterology

## 2023-07-12 ENCOUNTER — Ambulatory Visit: Payer: BC Managed Care – PPO | Admitting: Certified Registered"

## 2023-07-12 ENCOUNTER — Ambulatory Visit
Admission: RE | Admit: 2023-07-12 | Discharge: 2023-07-12 | Disposition: A | Discharge status: Home / Self Care | Payer: BC Managed Care – PPO | Attending: Gastroenterology | Admitting: Gastroenterology

## 2023-07-12 DIAGNOSIS — F17210 Nicotine dependence, cigarettes, uncomplicated: Secondary | ICD-10-CM | POA: Diagnosis not present

## 2023-07-12 DIAGNOSIS — K640 First degree hemorrhoids: Secondary | ICD-10-CM | POA: Diagnosis not present

## 2023-07-12 DIAGNOSIS — K635 Polyp of colon: Secondary | ICD-10-CM | POA: Diagnosis not present

## 2023-07-12 DIAGNOSIS — Z6841 Body Mass Index (BMI) 40.0 and over, adult: Secondary | ICD-10-CM | POA: Insufficient documentation

## 2023-07-12 DIAGNOSIS — Z1211 Encounter for screening for malignant neoplasm of colon: Secondary | ICD-10-CM | POA: Diagnosis not present

## 2023-07-12 DIAGNOSIS — E119 Type 2 diabetes mellitus without complications: Secondary | ICD-10-CM | POA: Diagnosis not present

## 2023-07-12 DIAGNOSIS — D123 Benign neoplasm of transverse colon: Secondary | ICD-10-CM | POA: Diagnosis not present

## 2023-07-12 DIAGNOSIS — F419 Anxiety disorder, unspecified: Secondary | ICD-10-CM | POA: Insufficient documentation

## 2023-07-12 HISTORY — PX: POLYPECTOMY: SHX5525

## 2023-07-12 HISTORY — PX: COLONOSCOPY WITH PROPOFOL: SHX5780

## 2023-07-12 LAB — GLUCOSE, CAPILLARY: Glucose-Capillary: 154 mg/dL — ABNORMAL HIGH (ref 70–99)

## 2023-07-12 SURGERY — COLONOSCOPY WITH PROPOFOL
Anesthesia: General

## 2023-07-12 MED ORDER — PROPOFOL 10 MG/ML IV BOLUS
INTRAVENOUS | Status: DC | PRN
  Administered 2023-07-12: 40 mg via INTRAVENOUS
  Administered 2023-07-12: 20 mg via INTRAVENOUS
  Administered 2023-07-12: 50 mg via INTRAVENOUS
  Administered 2023-07-12 (×3): 20 mg via INTRAVENOUS

## 2023-07-12 MED ORDER — LIDOCAINE HCL (PF) 2 % IJ SOLN
INTRAMUSCULAR | Status: AC
  Filled 2023-07-12: qty 5

## 2023-07-12 MED ORDER — SODIUM CHLORIDE 0.9 % IV SOLN
INTRAVENOUS | Status: DC
  Administered 2023-07-12: 20 mL/h via INTRAVENOUS

## 2023-07-12 MED ORDER — PROPOFOL 1000 MG/100ML IV EMUL
INTRAVENOUS | Status: AC
  Filled 2023-07-12: qty 100

## 2023-07-12 MED ORDER — LIDOCAINE HCL (PF) 2 % IJ SOLN
INTRAMUSCULAR | Status: DC | PRN
  Administered 2023-07-12: 40 mg via INTRADERMAL

## 2023-07-12 NOTE — Transfer of Care (Signed)
Immediate Anesthesia Transfer of Care Note  Patient: Amanda Moran  Procedure(s) Performed: COLONOSCOPY WITH PROPOFOL POLYPECTOMY  Patient Location: PACU and Endoscopy Unit  Anesthesia Type:MAC  Level of Consciousness: awake  Airway & Oxygen Therapy: Patient Spontanous Breathing and Patient connected to nasal cannula oxygen  Post-op Assessment: Report given to RN and Post -op Vital signs reviewed and stable  Post vital signs: Reviewed and stable  Last Vitals:  Vitals Value Taken Time  BP    Temp    Pulse 76 07/12/23 0753  Resp 24 07/12/23 0753  SpO2 95 % 07/12/23 0753  Vitals shown include unfiled device data.  Last Pain:  Vitals:   07/12/23 0633  TempSrc: Temporal  PainSc: 0-No pain         Complications: No notable events documented.

## 2023-07-12 NOTE — Anesthesia Postprocedure Evaluation (Signed)
Anesthesia Post Note  Patient: Amanda Moran  Procedure(s) Performed: COLONOSCOPY WITH PROPOFOL POLYPECTOMY  Patient location during evaluation: Endoscopy Anesthesia Type: General Level of consciousness: awake and alert Pain management: pain level controlled Vital Signs Assessment: post-procedure vital signs reviewed and stable Respiratory status: spontaneous breathing, nonlabored ventilation, respiratory function stable and patient connected to nasal cannula oxygen Cardiovascular status: blood pressure returned to baseline and stable Postop Assessment: no apparent nausea or vomiting Anesthetic complications: no   No notable events documented.   Last Vitals:  Vitals:   07/12/23 0803 07/12/23 0813  BP: (!) 130/92 (!) 170/109  Pulse: 76 (!) 58  Resp: 17 16  Temp: (!) 35.8 C (!) 35.8 C  SpO2: 96% 99%    Last Pain:  Vitals:   07/12/23 0813  TempSrc: Temporal  PainSc: 0-No pain                 Corinda Gubler

## 2023-07-12 NOTE — Op Note (Signed)
[facility] Patient Name: Amanda Moran Procedure Date: 07/12/2023 7:30 AM MRN: [id number] Account #: [id number] Date of Birth: Aug 03, 1971 Admit Type: Outpatient Age: 52 Room: [facility] ENDO ROOM 4 Gender: Female Note Status: Finalized Instrument Name: Prentice Docker [id number] Procedure:             Colonoscopy Indications:           Screening for colorectal malignant neoplasm Providers:             Midge Minium MD, MD Referring MD:          Dorcas Carrow (Referring MD) Medicines:             Propofol per Anesthesia Complications:         No immediate complications. Procedure:             Pre-Anesthesia Assessment:                        - Prior to the procedure, a History and Physical was                         performed, and patient medications and allergies were                         reviewed. The patient's tolerance of previous                         anesthesia was also reviewed. The risks and benefits                         of the procedure and the sedation options and risks                         were discussed with the patient. All questions were                         answered, and informed consent was obtained. Prior                         Anticoagulants: The patient has taken no anticoagulant                         or antiplatelet agents. ASA Grade Assessment: II - A                         patient with mild systemic disease. After reviewing                         the risks and benefits, the patient was deemed in                         satisfactory condition to undergo the procedure.                        After obtaining informed consent, the colonoscope was                         passed under direct vision. Throughout the procedure,  the patient's blood pressure, pulse, and oxygen                         saturations were monitored continuously. The                         Colonoscope was introduced through  the anus and                         advanced to the the cecum, identified by appendiceal                         orifice and ileocecal valve. The colonoscopy was                         performed without difficulty. The patient tolerated                         the procedure well. The quality of the bowel                         preparation was excellent. Findings:      The perianal and digital rectal examinations were normal.      A 3 mm polyp was found in the transverse colon. The polyp was sessile.       The polyp was removed with a cold biopsy forceps. Resection and       retrieval were complete.      Non-bleeding internal hemorrhoids were found during retroflexion. The       hemorrhoids were Grade I (internal hemorrhoids that do not prolapse). Impression:            - One 3 mm polyp in the transverse colon, removed with                         a cold biopsy forceps. Resected and retrieved.                        - Non-bleeding internal hemorrhoids. Recommendation:        - Discharge patient to home.                        - Resume previous diet.                        - Await pathology results.                        - If the pathology report reveals adenomatous tissue,                         then repeat the colonoscopy for surveillance in 7                         years. Procedure Code(s):     --- Professional ---                        [phone], Colonoscopy, flexible; with biopsy, single or  multiple Diagnosis Code(s):     --- Professional ---                        Z12.11, Encounter for screening for malignant neoplasm                         of colon                        D12.3, Benign neoplasm of transverse colon (hepatic                         flexure or splenic flexure) CPT copyright 2022 American Medical Association. All rights reserved. The codes documented in this report are preliminary and upon coder review may  be revised to meet current  compliance requirements. Midge Minium MD, MD 07/12/2023 7:52:21 AM This report has been signed electronically. Number of Addenda: 0 Note Initiated On: 07/12/2023 7:30 AM Scope Withdrawal Time: 0 hours 8 minutes 14 seconds  Total Procedure Duration: 0 hours 10 minutes 52 seconds  Estimated Blood Loss:  Estimated blood loss: none.      [facility]

## 2023-07-12 NOTE — H&P (Signed)
Midge Minium, MD [facility] [address], [address] [place], [place] [address] Phone: [phone] Fax : [phone]  Primary Care Physician:  Dorcas Carrow, DO Primary Gastroenterologist:  Dr. Servando Snare  Pre-Procedure History & Physical: HPI:  Amanda Moran is a 53 y.o. female is here for a screening colonoscopy.   Past Medical History:  Diagnosis Date   Abnormal uterine bleeding 05/20/2015   Overview:  Hgb 7.9 on 5/19 preoperatively. At [facility] preop.    Acanthosis nigricans    Anemia    Anxiety    Diabetes mellitus without complication (HCC)    patient unaware   Fibroids    Fibroids    Headache    Iron deficiency anemia     Past Surgical History:  Procedure Laterality Date   ABDOMINAL HYSTERECTOMY  05/29/2015   Procedure: HYSTERECTOMY ABDOMINAL;  Surgeon: Wille Celeste, MD;  Location: [facility] ORS;  Service: Gynecology;;   ACHILLES TENDON SURGERY Left 06/07/2019   Procedure: ACHILLES REPAIR SECONDARY LEFT;  Surgeon: Gwyneth Revels, DPM;  Location: [facility] ORS;  Service: Podiatry;  Laterality: Left;   ACHILLES TENDON SURGERY Right 08/30/2019   Procedure: ACHILLES REPAIR SECONDARY RIGHT;  Surgeon: Gwyneth Revels, DPM;  Location: [facility] ORS;  Service: Podiatry;  Laterality: Right;   BREAST BIOPSY Left 10/04/2017   [place] bx /clip-neg   CALCANEAL OSTEOTOMY Right 08/30/2019   Procedure: HAGLUNDS/RETROCALCANEAL (OSTECTOMY) RIGHT;  Surgeon: Gwyneth Revels, DPM;  Location: [facility] ORS;  Service: Podiatry;  Laterality: Right;   CHOLECYSTECTOMY     KNEE ARTHROSCOPY Right    KNEE ARTHROSCOPY WITH ANTERIOR CRUCIATE LIGAMENT (ACL) REPAIR WITH HAMSTRING GRAFT Right 02/14/2017   Procedure: KNEE ARTHROSCOPY WITH ANTERIOR CRUCIATE LIGAMENT (ACL) REPAIR WITH HAMSTRING GRAFT;  Surgeon: Juanell Fairly, MD;  Location: [facility] ORS;  Service: Orthopedics;  Laterality: Right;   LAPAROSCOPIC SUPRACERVICAL HYSTERECTOMY N/A 05/29/2015   Procedure: LAPAROSCOPIC SUPRACERVICAL HYSTERECTOMY;  Surgeon: Wille Celeste, MD;   Location: [facility] ORS;  Service: Gynecology;  Laterality: N/A;  attempted   OSTECTOMY Left 06/07/2019   Procedure: HAGLUNDS/RETROCALCANEAL OSTECTOMY;  Surgeon: Gwyneth Revels, DPM;  Location: [facility] ORS;  Service: Podiatry;  Laterality: Left;   TUBAL LIGATION      Prior to Admission medications   Medication Sig Start Date End Date Taking? Authorizing Provider  busPIRone (BUSPAR) 5 MG tablet Take 1 tablet (5 mg total) by mouth 2 (two) times daily. 06/30/23  Yes Mecum, Erin E, PA-C  DULoxetine (CYMBALTA) 20 MG capsule Take 1 capsule (20 mg total) by mouth daily. 06/08/23  Yes Johnson, Megan P, DO  etodolac (LODINE) 500 MG tablet TAKE 1 TABLET BY MOUTH TWICE DAILY 05/16/23  Yes Johnson, Megan P, DO  hydrOXYzine (ATARAX) 25 MG tablet TAKE 1 TABLET BY MOUTH EVERY 8 HOURS AS NEEDED FOR ANXIETY 08/03/22  Yes Johnson, Megan P, DO  traZODone (DESYREL) 50 MG tablet Take 0.5-1 tablets (25-50 mg total) by mouth at bedtime as needed for sleep. 06/08/23  Yes Johnson, Megan P, DO  cyclobenzaprine (FLEXERIL) 10 MG tablet Take 1 tablet (10 mg total) by mouth at bedtime. Patient not taking: Reported on 06/30/2023 06/08/23   Olevia Perches P, DO  metroNIDAZOLE (FLAGYL) 500 MG tablet Take 1 tablet (500 mg total) by mouth 2 (two) times daily. Patient not taking: Reported on 06/30/2023 06/08/23   Dorcas Carrow, DO    Allergies as of 06/13/2023   (No Known Allergies)    Family History  Problem Relation Age of Onset   Alcohol abuse Father    Arthritis  Father    Diabetes Father    Drug abuse Father    Cancer Father        pancreatic   Diabetes Brother    Cancer Mother        cervical   Breast cancer Maternal Aunt    Breast cancer Cousin        mat cousins    Social History   Socioeconomic History   Marital status: Married    Spouse name: Not on file   Number of children: Not on file   Years of education: Not on file   Highest education level: Not on file  Occupational History   Not on file  Tobacco Use    Smoking status: Every Day    Current packs/day: 1.00    Types: Cigarettes   Smokeless tobacco: Never  Vaping Use   Vaping status: Never Used  Substance and Sexual Activity   Alcohol use: Yes    Comment: socially   Drug use: No   Sexual activity: Yes  Other Topics Concern   Not on file  Social History Narrative   Not on file   Social Determinants of Health   Financial Resource Strain: Not on file  Food Insecurity: Not on file  Transportation Needs: Not on file  Physical Activity: Not on file  Stress: Not on file  Social Connections: Not on file  Intimate Partner Violence: Not on file    Review of Systems: See HPI, otherwise negative ROS  Physical Exam: BP (!) 169/117   Pulse 78   Temp (!) 97.1 F (36.2 C) (Temporal)   Resp 20   Ht 5\' 3"  (1.6 m)   Wt 115.8 kg   LMP  (LMP Unknown)   SpO2 98%   BMI 45.21 kg/m  General:   Alert,  pleasant and cooperative in NAD Head:  Normocephalic and atraumatic. Neck:  Supple; no masses or thyromegaly. Lungs:  Clear throughout to auscultation.    Heart:  Regular rate and rhythm. Abdomen:  Soft, nontender and nondistended. Normal bowel sounds, without guarding, and without rebound.   Neurologic:  Alert and  oriented x4;  grossly normal neurologically.  Impression/Plan: Amanda Moran is now here to undergo a screening colonoscopy.  Risks, benefits, and alternatives regarding colonoscopy have been reviewed with the patient.  Questions have been answered.  All parties agreeable.

## 2023-07-12 NOTE — Anesthesia Preprocedure Evaluation (Signed)
Anesthesia Evaluation  Patient identified by MRN, date of birth, ID band Patient awake    Reviewed: Allergy & Precautions, NPO status , Patient's Chart, lab work & pertinent test results  History of Anesthesia Complications Negative for: history of anesthetic complications  Airway Mallampati: II  TM Distance: >3 FB Neck ROM: Full    Dental no notable dental hx. (+) Teeth Intact   Pulmonary neg sleep apnea, neg COPD, Current SmokerPatient did not abstain from smoking.   Pulmonary exam normal breath sounds clear to auscultation       Cardiovascular Exercise Tolerance: Good METS(-) hypertension(-) CAD and (-) Past MI negative cardio ROS (-) dysrhythmias  Rhythm:Regular Rate:Normal - Systolic murmurs    Neuro/Psych  Headaches PSYCHIATRIC DISORDERS Anxiety        GI/Hepatic ,neg GERD  ,,(+)     (-) substance abuse    Endo/Other  neg diabetes  Morbid obesity  Renal/GU negative Renal ROS     Musculoskeletal   Abdominal  (+) + obese  Peds  Hematology  (+) Blood dyscrasia, anemia   Anesthesia Other Findings Past Medical History: 05/20/2015: Abnormal uterine bleeding     Comment:  Overview:  Hgb 7.9 on 5/19 preoperatively. At [facility]               preop.  No date: Acanthosis nigricans No date: Anemia No date: Anxiety No date: Diabetes mellitus without complication (HCC)     Comment:  patient unaware No date: Fibroids No date: Fibroids No date: Headache No date: Iron deficiency anemia  Reproductive/Obstetrics                             Anesthesia Physical Anesthesia Plan  ASA: 3  Anesthesia Plan: General   Post-op Pain Management: Minimal or no pain anticipated   Induction: Intravenous  PONV Risk Score and Plan: 2 and Propofol infusion, TIVA and Ondansetron  Airway Management Planned: Nasal Cannula  Additional Equipment: None  Intra-op Plan:   Post-operative Plan:   Informed  Consent: I have reviewed the patients History and Physical, chart, labs and discussed the procedure including the risks, benefits and alternatives for the proposed anesthesia with the patient or authorized representative who has indicated his/her understanding and acceptance.     Dental advisory given  Plan Discussed with: CRNA and Surgeon  Anesthesia Plan Comments: (Discussed risks of anesthesia with patient, including possibility of difficulty with spontaneous ventilation under anesthesia necessitating airway intervention, PONV, and rare risks such as cardiac or respiratory or neurological events, and allergic reactions. Discussed the role of CRNA in patient's perioperative care. Patient understands. Patient informed about increased incidence of above perioperative risk due to high BMI. Patient understands.  Patient counseled on benefits of smoking cessation, and increased perioperative risks associated with continued smoking. )       Anesthesia Quick Evaluation

## 2023-07-13 ENCOUNTER — Telehealth: Payer: Self-pay | Admitting: Family Medicine

## 2023-07-13 ENCOUNTER — Encounter: Payer: Self-pay | Admitting: Gastroenterology

## 2023-07-13 NOTE — Telephone Encounter (Unsigned)
Copied from CRM [id number]. Topic: General - Other >> Jul 13, 2023  1:44 PM Ja-Kwan M wrote: Reason for CRM: Pt stated she was informed during her colonoscopy that her chart shows that she is a diabetic. Pt stated she needs confirmation whether she is diabetic or not. Cb# [phone]

## 2023-07-13 NOTE — Telephone Encounter (Signed)
Routing to provider to advise. Reviewed problem list, do not see a diabetes diagnosis.

## 2023-07-15 ENCOUNTER — Encounter: Payer: Self-pay | Admitting: Gastroenterology

## 2023-07-17 NOTE — Telephone Encounter (Signed)
Called and spoke with patient. Advised her of the message from [place]. Patient thanked me for the return call and explanation.

## 2023-07-25 ENCOUNTER — Ambulatory Visit: Payer: BC Managed Care – PPO | Admitting: Family Medicine

## 2023-08-26 ENCOUNTER — Other Ambulatory Visit: Payer: Self-pay | Admitting: Family Medicine

## 2023-08-29 NOTE — Telephone Encounter (Signed)
Requested medication (s) are due for refill today: yes  Requested medication (s) are on the active medication list: yes  Last refill:  05/16/23 #30 0 refills  Future visit scheduled: no  Notes to clinic:  no refills remain do you want to refill Rx?     Requested Prescriptions  Pending Prescriptions Disp Refills   etodolac (LODINE) 500 MG tablet [Pharmacy Med Name: ETODOLAC 500 MG TAB] 30 tablet 0    Sig: TAKE 1 TABLET BY MOUTH TWICE DAILY     Analgesics:  NSAIDS Failed - 08/26/2023  9:16 AM      Failed - Manual Review: Labs are only required if the patient has taken medication for more than 8 weeks.      Passed - Cr in normal range and within 360 days    Creatinine  Date Value Ref Range Status  04/24/2015 0.61 mg/dL Final    Comment:    4.09-8.11 NOTE: New Reference Range  03/03/15    Creatinine, Ser  Date Value Ref Range Status  06/08/2023 0.76 0.57 - 1.00 mg/dL Final         Passed - HGB in normal range and within 360 days    Hemoglobin  Date Value Ref Range Status  06/08/2023 13.8 11.1 - 15.9 g/dL Final         Passed - PLT in normal range and within 360 days    Platelets  Date Value Ref Range Status  06/08/2023 171 150 - 450 x10E3/uL Final         Passed - HCT in normal range and within 360 days    Hematocrit  Date Value Ref Range Status  06/08/2023 42.1 34.0 - 46.6 % Final         Passed - eGFR is 30 or above and within 360 days    EGFR (African American)  Date Value Ref Range Status  04/24/2015 >60  Final   GFR calc Af Amer  Date Value Ref Range Status  04/07/2020 124 >59 mL/min/1.73 Final   EGFR (Non-African Amer.)  Date Value Ref Range Status  04/24/2015 >60  Final    Comment:    eGFR values <59mL/min/1.73 m2 may be an indication of chronic kidney disease (CKD). Calculated eGFR is useful in patients with stable renal function. The eGFR calculation will not be reliable in acutely ill patients when serum creatinine is changing rapidly. It is  not useful in patients on dialysis. The eGFR calculation may not be applicable to patients at the low and high extremes of body sizes, pregnant women, and vegetarians.    GFR calc non Af Amer  Date Value Ref Range Status  04/07/2020 107 >59 mL/min/1.73 Final   eGFR  Date Value Ref Range Status  06/08/2023 95 >59 mL/min/1.73 Final         Passed - Patient is not pregnant      Passed - Valid encounter within last 12 months    Recent Outpatient Visits           2 months ago Acute anxiety   [facility] Mecum, Oswaldo Conroy, PA-C   2 months ago Routine general medical examination at a health care facility   [facility] [place], [place] P, DO   1 year ago Acute right-sided low back pain without sciatica   [facility] [place], [place] P, DO   2 years ago Routine general medical examination at a health care facility  [facility] [place], [place] P, DO   2 years ago Acute anxiety   [place] [facility] [place], [place], [place]

## 2023-09-13 ENCOUNTER — Encounter: Payer: Self-pay | Admitting: Nurse Practitioner

## 2023-09-13 ENCOUNTER — Telehealth (INDEPENDENT_AMBULATORY_CARE_PROVIDER_SITE_OTHER): Payer: BC Managed Care – PPO | Admitting: Nurse Practitioner

## 2023-09-13 DIAGNOSIS — F17210 Nicotine dependence, cigarettes, uncomplicated: Secondary | ICD-10-CM

## 2023-09-13 MED ORDER — NICOTINE 7 MG/24HR TD PT24: 7.0000 mg | MEDICATED_PATCH | Freq: Every day | TRANSDERMAL | 3 refills | Status: DC

## 2023-09-13 MED ORDER — NICOTINE 14 MG/24HR TD PT24: 14.0000 mg | MEDICATED_PATCH | Freq: Every day | TRANSDERMAL | 0 refills | Status: DC

## 2023-09-13 MED ORDER — NICOTINE 21 MG/24HR TD PT24: 21.0000 mg | MEDICATED_PATCH | Freq: Every day | TRANSDERMAL | 0 refills | Status: AC

## 2023-09-13 NOTE — Progress Notes (Signed)
LMP  (LMP Unknown)    Subjective:    Patient ID: Amanda Moran, female    DOB: [birth year], 52 y.o.   MRN: [id number]  HPI: Amanda Moran is a 52 y.o. female  Chief Complaint  Patient presents with   Nicotine Dependence    Patient states she would like to discuss stopping smoking. States she is currently taking 1.5 packs daily. States she would like to try the patches.    Virtual Visit via Video Note  I connected with Amanda Moran on 09/13/23 at  9:40 AM EDT by a video enabled telemedicine application and verified that I am speaking with the correct person using two identifiers.  Location: Patient: home Provider: work   I discussed the limitations of evaluation and management by telemedicine and the availability of in person appointments. The patient expressed understanding and agreed to proceed.  I discussed the assessment and treatment plan with the patient. The patient was provided an opportunity to ask questions and all were answered. The patient agreed with the plan and demonstrated an understanding of the instructions.   The patient was advised to call back or seek an in-person evaluation if the symptoms worsen or if the condition fails to improve as anticipated.  I provided 21 minutes of non-face-to-face time during this encounter.   Marjie Skiff, NP   SMOKING CESSATION Presents today to discuss smoking cessation and patches.  10 years ago she stopped for 3 years.  It has been 7 years since she started back to smoking.  Currently smoking almost 2 packs a day due to anxiety.  Has smoked since age 82.  Smokes more when she is smoking.  Is involved in a support with her work to help quit. Smoking Status: current smoker Smoking Amount: as above Smoking Onset: since age 67 Smoking Quit Date: on 09/22/23 Smoking triggers: anxiety Type of tobacco use: cigarettes Children in the house: no Other household members who smoke: no Treatments attempted: vaping, cutting  down cigarettes Pneumovax:  Up To Date  Relevant past medical, surgical, family and social history reviewed and updated as indicated. Interim medical history since our last visit reviewed. Allergies and medications reviewed and updated.  Review of Systems  Constitutional:  Negative for activity change, appetite change, diaphoresis, fatigue and fever.  Respiratory:  Negative for cough, chest tightness and shortness of breath.   Cardiovascular:  Negative for chest pain, palpitations and leg swelling.  Endocrine: Negative for cold intolerance.  Psychiatric/Behavioral: Negative.      Per HPI unless specifically indicated above     Objective:    LMP  (LMP Unknown)   Wt Readings from Last 3 Encounters:  07/12/23 255 lb 3.2 oz (115.8 kg)  06/08/23 261 lb 6.4 oz (118.6 kg)  04/07/22 261 lb 11.2 oz (118.7 kg)    Physical Exam Vitals and nursing note reviewed.  Constitutional:      General: She is awake. She is not in acute distress.    Appearance: She is well-developed. She is not ill-appearing.  HENT:     Head: Normocephalic.     Right Ear: Hearing normal.     Left Ear: Hearing normal.  Eyes:     General: Lids are normal.        Right eye: No discharge.        Left eye: No discharge.     Conjunctiva/sclera: Conjunctivae normal.  Pulmonary:     Effort: Pulmonary effort is normal. No accessory muscle usage  or respiratory distress.  Musculoskeletal:     Cervical back: Normal range of motion.  Neurological:     Mental Status: She is alert and oriented to person, place, and time.  Psychiatric:        Attention and Perception: Attention normal.        Mood and Affect: Mood normal.        Behavior: Behavior normal. Behavior is cooperative.        Thought Content: Thought content normal.        Judgment: Judgment normal.     Results for orders placed or performed during the hospital encounter of 07/12/23  Glucose, capillary  Result Value Ref Range   Glucose-Capillary 154 (H)  70 - 99 mg/dL   Comment 1 IN EPIC       Assessment & Plan:   Problem List Items Addressed This Visit       Other   Nicotine dependence, cigarettes, uncomplicated - Primary    Has smoked since age 46, currently almost 2 packs today and has a quit date set for 09/22/23.  She would like to start out using patches.  Educated her on this.  Sent patches in to initiate and reduce down.  Plan on follow-up in 4 weeks to assess medication use and smoking pattern.  Would benefit from lung cancer screening in future.      Relevant Medications   nicotine (NICODERM CQ - DOSED IN MG/24 HOURS) 21 mg/24hr patch   nicotine (NICODERM CQ - DOSED IN MG/24 HOURS) 14 mg/24hr patch (Start on 10/25/2023)   nicotine (NICODERM CQ - DOSED IN MG/24 HR) 7 mg/24hr patch (Start on 11/08/2023)     Follow up plan: Return in about 4 weeks (around 10/11/2023) for Smoking Cessation.

## 2023-09-13 NOTE — Assessment & Plan Note (Addendum)
Has smoked since age 52, currently almost 2 packs today and has a quit date set for 09/22/23.  She would like to start out using patches.  Educated her on this.  Sent patches in to initiate and reduce down.  Plan on follow-up in 4 weeks to assess medication use and smoking pattern.  Would benefit from lung cancer screening in future.

## 2023-09-13 NOTE — Progress Notes (Signed)
Called and scheduled appointment on 10/11/2023 @ 11:20 am virtually.

## 2023-09-13 NOTE — Patient Instructions (Signed)

## 2023-10-07 ENCOUNTER — Other Ambulatory Visit: Payer: Self-pay | Admitting: Family Medicine

## 2023-10-09 NOTE — Telephone Encounter (Signed)
Requested Prescriptions  Pending Prescriptions Disp Refills   etodolac (LODINE) 500 MG tablet [Pharmacy Med Name: ETODOLAC 500 MG TAB] 180 tablet 0    Sig: TAKE 1 TABLET BY MOUTH TWICE DAILY     Analgesics:  NSAIDS Failed - 10/07/2023 12:05 PM      Failed - Manual Review: Labs are only required if the patient has taken medication for more than 8 weeks.      Passed - Cr in normal range and within 360 days    Creatinine  Date Value Ref Range Status  04/24/2015 0.61 mg/dL Final    Comment:    4.09-8.11 NOTE: New Reference Range  03/03/15    Creatinine, Ser  Date Value Ref Range Status  06/08/2023 0.76 0.57 - 1.00 mg/dL Final         Passed - HGB in normal range and within 360 days    Hemoglobin  Date Value Ref Range Status  06/08/2023 13.8 11.1 - 15.9 g/dL Final         Passed - PLT in normal range and within 360 days    Platelets  Date Value Ref Range Status  06/08/2023 171 150 - 450 x10E3/uL Final         Passed - HCT in normal range and within 360 days    Hematocrit  Date Value Ref Range Status  06/08/2023 42.1 34.0 - 46.6 % Final         Passed - eGFR is 30 or above and within 360 days    EGFR (African American)  Date Value Ref Range Status  04/24/2015 >60  Final   GFR calc Af Amer  Date Value Ref Range Status  04/07/2020 124 >59 mL/min/1.73 Final   EGFR (Non-African Amer.)  Date Value Ref Range Status  04/24/2015 >60  Final    Comment:    eGFR values <63mL/min/1.73 m2 may be an indication of chronic kidney disease (CKD). Calculated eGFR is useful in patients with stable renal function. The eGFR calculation will not be reliable in acutely ill patients when serum creatinine is changing rapidly. It is not useful in patients on dialysis. The eGFR calculation may not be applicable to patients at the low and high extremes of body sizes, pregnant women, and vegetarians.    GFR calc non Af Amer  Date Value Ref Range Status  04/07/2020 107 >59 mL/min/1.73  Final   eGFR  Date Value Ref Range Status  06/08/2023 95 >59 mL/min/1.73 Final         Passed - Patient is not pregnant      Passed - Valid encounter within last 12 months    Recent Outpatient Visits           3 weeks ago Nicotine dependence, cigarettes, uncomplicated   [facility] [place], [place] T, NP   3 months ago Acute anxiety   [facility] Mecum, Oswaldo Conroy, PA-C   4 months ago Routine general medical examination at a health care facility   [facility] [place], [place] P, DO   1 year ago Acute right-sided low back pain without sciatica    [facility] Olevia Perches P, DO   2 years ago Routine general medical examination at a health care facility   [facility] Dorcas Carrow, DO       Future Appointments             Tomorrow Olevia Perches  P, DO [facility]

## 2023-10-10 ENCOUNTER — Telehealth (INDEPENDENT_AMBULATORY_CARE_PROVIDER_SITE_OTHER): Payer: BC Managed Care – PPO | Admitting: Family Medicine

## 2023-10-10 ENCOUNTER — Encounter: Payer: Self-pay | Admitting: Family Medicine

## 2023-10-10 DIAGNOSIS — F17210 Nicotine dependence, cigarettes, uncomplicated: Secondary | ICD-10-CM

## 2023-10-10 MED ORDER — DICLOFENAC SODIUM 1 % EX GEL: 4.0000 g | Freq: Four times a day (QID) | CUTANEOUS | 3 refills | Status: AC

## 2023-10-10 NOTE — Progress Notes (Signed)
Appointment has been made

## 2023-10-10 NOTE — Progress Notes (Signed)
LMP  (LMP Unknown)    Subjective:    Patient ID: Amanda Moran, female    DOB: [birth year], 52 y.o.   MRN: [id number]  HPI: Amanda Moran is a 52 y.o. female  Chief Complaint  Patient presents with   Nicotine Dependence   SMOKING CESSATION Smoking Status: hasn't smoked in almost a month Smoking Amount: 2ppd Smoking Onset: 52yo Smoking Quit Date: 09/15/23 Smoking triggers: stress, leaving the house Type of tobacco use: cigarettes Children in the house: yes Other household members who smoke: no Treatments attempted: cold [place], patches Pneumovax: up to date  Relevant past medical, surgical, family and social history reviewed and updated as indicated. Interim medical history since our last visit reviewed. Allergies and medications reviewed and updated.  Review of Systems  Constitutional: Negative.   Respiratory: Negative.    Cardiovascular: Negative.   Gastrointestinal: Negative.   Musculoskeletal: Negative.   Skin: Negative.   Psychiatric/Behavioral: Negative.      Per HPI unless specifically indicated above     Objective:    LMP  (LMP Unknown)   Wt Readings from Last 3 Encounters:  07/12/23 255 lb 3.2 oz (115.8 kg)  06/08/23 261 lb 6.4 oz (118.6 kg)  04/07/22 261 lb 11.2 oz (118.7 kg)    Physical Exam Vitals and nursing note reviewed.  Constitutional:      General: She is not in acute distress.    Appearance: Normal appearance. She is obese. She is not ill-appearing, toxic-appearing or diaphoretic.  HENT:     Head: Normocephalic and atraumatic.     Right Ear: External ear normal.     Left Ear: External ear normal.     Nose: Nose normal.     Mouth/Throat:     Mouth: Mucous membranes are moist.     Pharynx: Oropharynx is clear.  Eyes:     General: No scleral icterus.       Right eye: No discharge.        Left eye: No discharge.     Conjunctiva/sclera: Conjunctivae normal.     Pupils: Pupils are equal, round, and reactive to light.  Pulmonary:      Effort: Pulmonary effort is normal. No respiratory distress.     Comments: Speaking in full sentences Musculoskeletal:        General: Normal range of motion.     Cervical back: Normal range of motion.  Skin:    Coloration: Skin is not jaundiced or pale.     Findings: No bruising, erythema, lesion or rash.  Neurological:     Mental Status: She is alert and oriented to person, place, and time. Mental status is at baseline.  Psychiatric:        Mood and Affect: Mood normal.        Behavior: Behavior normal.        Thought Content: Thought content normal.        Judgment: Judgment normal.     Results for orders placed or performed during the hospital encounter of 07/12/23  Glucose, capillary  Result Value Ref Range   Glucose-Capillary 154 (H) 70 - 99 mg/dL   Comment 1 IN EPIC       Assessment & Plan:   Problem List Items Addressed This Visit       Other   Nicotine dependence, cigarettes, uncomplicated - Primary    Doing well- tolerating her nicoderm. Continue wean. Call with any concerns.        Follow up plan:  Return December, 6 month follow up.    This visit was completed via video visit through MyChart due to the restrictions of the COVID-19 pandemic. All issues as above were discussed and addressed. Physical exam was done as above through visual confirmation on video through MyChart. If it was felt that the patient should be evaluated in the office, they were directed there. The patient verbally consented to this visit. Location of the patient:  home Location of the provider: work Those involved with this call:  Provider: Olevia Perches, DO CMA: Malen Gauze, CMA Front Desk/Registration:  Servando Snare   Time spent on call:  15 minutes with patient face to face via video conference. More than 50% of this time was spent in counseling and coordination of care. 23 minutes total spent in review of patient's record and preparation of their chart.

## 2023-10-10 NOTE — Assessment & Plan Note (Signed)
Doing well- tolerating her nicoderm. Continue wean. Call with any concerns.

## 2023-10-13 ENCOUNTER — Other Ambulatory Visit: Payer: Self-pay | Admitting: Family Medicine

## 2023-10-13 MED ORDER — NICOTINE 14 MG/24HR TD PT24: 14.0000 mg | MEDICATED_PATCH | Freq: Every day | TRANSDERMAL | 0 refills | Status: DC

## 2023-10-13 NOTE — Telephone Encounter (Signed)
Pt is calling to report that she went to the pharmacy to pick up her nicotine (NICODERM CQ - DOSED IN MG/24 HOURS) 14 mg/24hr patch [[id number]] and pharmacy states that they did not receive the script. Advised medication would be ready on 10/26/23. Pt states that she is on her last 21 and is ready to taper down to the 14. Please advise  CB- [phone]

## 2023-10-13 NOTE — Telephone Encounter (Signed)
Please advise, patient requesting dose change in Nicoderm CQ.

## 2023-10-23 ENCOUNTER — Ambulatory Visit (INDEPENDENT_AMBULATORY_CARE_PROVIDER_SITE_OTHER): Payer: BC Managed Care – PPO | Admitting: Family Medicine

## 2023-10-23 ENCOUNTER — Ambulatory Visit: Payer: Self-pay

## 2023-10-23 ENCOUNTER — Encounter: Payer: Self-pay | Admitting: Family Medicine

## 2023-10-23 ENCOUNTER — Ambulatory Visit
Admission: RE | Admit: 2023-10-23 | Discharge: 2023-10-23 | Disposition: A | Discharge status: Home / Self Care | Payer: BC Managed Care – PPO | Source: Ambulatory Visit | Attending: Family Medicine | Admitting: Family Medicine

## 2023-10-23 ENCOUNTER — Ambulatory Visit
Admission: RE | Admit: 2023-10-23 | Discharge: 2023-10-23 | Disposition: A | Discharge status: Home / Self Care | Payer: BC Managed Care – PPO | Attending: Family Medicine | Admitting: Family Medicine

## 2023-10-23 VITALS — BP 145/90 | HR 84 | Ht 63.0 in | Wt 247.4 lb

## 2023-10-23 DIAGNOSIS — M1811 Unilateral primary osteoarthritis of first carpometacarpal joint, right hand: Secondary | ICD-10-CM | POA: Diagnosis not present

## 2023-10-23 DIAGNOSIS — M25532 Pain in left wrist: Secondary | ICD-10-CM | POA: Insufficient documentation

## 2023-10-23 DIAGNOSIS — M25531 Pain in right wrist: Secondary | ICD-10-CM | POA: Insufficient documentation

## 2023-10-23 DIAGNOSIS — M545 Low back pain, unspecified: Secondary | ICD-10-CM

## 2023-10-23 DIAGNOSIS — M25521 Pain in right elbow: Secondary | ICD-10-CM | POA: Diagnosis not present

## 2023-10-23 DIAGNOSIS — M533 Sacrococcygeal disorders, not elsewhere classified: Secondary | ICD-10-CM | POA: Diagnosis not present

## 2023-10-23 DIAGNOSIS — M1711 Unilateral primary osteoarthritis, right knee: Secondary | ICD-10-CM | POA: Diagnosis not present

## 2023-10-23 DIAGNOSIS — M25561 Pain in right knee: Secondary | ICD-10-CM

## 2023-10-23 DIAGNOSIS — M19032 Primary osteoarthritis, left wrist: Secondary | ICD-10-CM | POA: Diagnosis not present

## 2023-10-23 MED ORDER — BACLOFEN 10 MG PO TABS: 10.0000 mg | ORAL_TABLET | Freq: Every evening | ORAL | 0 refills | Status: DC | PRN

## 2023-10-23 MED ORDER — NICOTINE 14 MG/24HR TD PT24: 14.0000 mg | MEDICATED_PATCH | Freq: Every day | TRANSDERMAL | 0 refills | Status: AC

## 2023-10-23 MED ORDER — KETOROLAC TROMETHAMINE 60 MG/2ML IM SOLN
60.0000 mg | Freq: Once | INTRAMUSCULAR | Status: AC
Start: 2023-10-23 — End: 2023-10-23
  Administered 2023-10-23: 60 mg via INTRAMUSCULAR

## 2023-10-23 NOTE — Telephone Encounter (Signed)
  Chief Complaint: Elbow and tailbone pain from fall Symptoms: above Frequency: Saturday Pertinent Negatives: Patient denies  Disposition: [] ED /[] Urgent Care (no appt availability in office) / [x] Appointment(In office/virtual)/ []  [facility]/ [] Home Care/ [] Refused Recommended Disposition /[] [facility]/ []  Follow-up with PCP Additional Notes: Pt fell off a chair outside. She was standing on the chair painting. Pt thought foot was on the ground, it was not. Pt fell on her buttocks, injuring her tailbone. Now elbow hurts, and cannot use it very well. Reason for Disposition  Can't move injured elbow normally (i.e., bend or straighten completely)  Answer Assessment - Initial Assessment Questions 1. MECHANISM: "How did the fall happen?"     On ladder painting 2. DOMESTIC VIOLENCE AND ELDER ABUSE SCREENING: "Did you fall because someone pushed you or tried to hurt you?" If Yes, ask: "Are you safe now?"     no 3. ONSET: "When did the fall happen?" (e.g., minutes, hours, or days ago)     Saturday 4. LOCATION: "What part of the body hit the ground?" (e.g., back, buttocks, head, hips, knees, hands, head, stomach)     Buttocks and elbow right 5. INJURY: "Did you hurt (injure) yourself when you fell?" If Yes, ask: "What did you injure? Tell me more about this?" (e.g., body area; type of injury; pain severity)"     Elbow and buttocks 6. PAIN: "Is there any pain?" If Yes, ask: "How bad is the pain?" (e.g., Scale 1-10; or mild,  moderate, severe)   - NONE (0): No pain   - MILD (1-3): Doesn't interfere with normal activities    - MODERATE (4-7): Interferes with normal activities or awakens from sleep    - SEVERE (8-10): Excruciating pain, unable to do any normal activities      Tail bone hurts 7. SIZE: For cuts, bruises, or swelling, ask: "How large is it?" (e.g., inches or centimeters)      Arm bruise 10. CAUSE: "What do you think caused the fall (or falling)?" (e.g.,  tripped, dizzy spell)       Miss stepped  Answer Assessment - Initial Assessment Questions 1. MECHANISM: "How did the injury happen?"     Fell on Saturday  2. ONSET: "When did the injury happen?" (Minutes or hours ago)      Saturday  5. SEVERITY: "Can you use the elbow normally?"  "Can you bend it and straighten it fully?"     Painful - difficulty using arm  7. PAIN: "Is there pain?" If Yes, ask: "How bad is the pain?"    (Scale 1-10; or mild, moderate, severe)   - NONE (0): no pain.   - MILD (1-3): doesn't interfere with normal activities.   - MODERATE (4-7): interferes with normal activities (e.g., work or school) or awakens from sleep.   - SEVERE (8-10): excruciating pain, unable to do any normal activities, unable to use arm at all.     moderate 9. OTHER SYMPTOMS: "Do you have any other symptoms?"  (e.g., numbness in hand)     Tailbone hurts  Protocols used: Falls and Falling-A-AH, Elbow Injury-A-AH

## 2023-10-23 NOTE — Telephone Encounter (Signed)
This encounter was created in error - please disregard.

## 2023-10-23 NOTE — Progress Notes (Signed)
BP (!) 145/90   Pulse 84   Ht 5\' 3"  (1.6 m)   Wt 247 lb 6.4 oz (112.2 kg)   LMP  (LMP Unknown)   SpO2 97%   BMI 43.82 kg/m    Subjective:    Patient ID: Amanda Moran, female    DOB: 12-30-1970, 52 y.o.   MRN: [id number]  HPI: Amanda Moran is a 52 y.o. female  Chief Complaint  Patient presents with   Elbow Pain   Tailbone Pain   Fall    Patient says she was spray painting on Saturday and fell and landed on her buttock and caught herself with her hands. Patient says she had an old prescription for Flexeril along with the Etodolac prescription and says either of them are helping. Patient says she noticed some discomfort in her L hand/wrist. Patient says she is having knee pain as well.    Knee Pain   Fell on Saturday and landed on her buttocks on her back. She caught herself on both of her hands and her L thumb is swollen. She notes that her R elbow and hand have been swollen and painful. She notes that her R knee and her tailbone have also been painful. She denies hitting her knee. She notes that she is worse with typing and sitting. Nothing making it better. She has tried flexeril, etodolac and Voltaren without any benefit. Able to walk, + numbness and tingling in her L hand. She is otherwise doing well with no other concerns or complaints at this time.    Relevant past medical, surgical, family and social history reviewed and updated as indicated. Interim medical history since our last visit reviewed. Allergies and medications reviewed and updated.  Review of Systems  Constitutional: Negative.   Respiratory: Negative.    Cardiovascular: Negative.   Gastrointestinal: Negative.   Musculoskeletal:  Positive for arthralgias and myalgias. Negative for back pain, gait problem, joint swelling, neck pain and neck stiffness.  Skin: Negative.   Neurological: Negative.   Psychiatric/Behavioral: Negative.      Per HPI unless specifically indicated above     Objective:    BP  (!) 145/90   Pulse 84   Ht 5\' 3"  (1.6 m)   Wt 247 lb 6.4 oz (112.2 kg)   LMP  (LMP Unknown)   SpO2 97%   BMI 43.82 kg/m   Wt Readings from Last 3 Encounters:  10/23/23 247 lb 6.4 oz (112.2 kg)  07/12/23 255 lb 3.2 oz (115.8 kg)  06/08/23 261 lb 6.4 oz (118.6 kg)    Physical Exam Vitals and nursing note reviewed.  Constitutional:      General: She is not in acute distress.    Appearance: Normal appearance. She is not ill-appearing, toxic-appearing or diaphoretic.  HENT:     Head: Normocephalic and atraumatic.     Right Ear: External ear normal.     Left Ear: External ear normal.     Nose: Nose normal.     Mouth/Throat:     Mouth: Mucous membranes are moist.     Pharynx: Oropharynx is clear.  Eyes:     General: No scleral icterus.       Right eye: No discharge.        Left eye: No discharge.     Extraocular Movements: Extraocular movements intact.     Conjunctiva/sclera: Conjunctivae normal.     Pupils: Pupils are equal, round, and reactive to light.  Cardiovascular:  Rate and Rhythm: Normal rate and regular rhythm.     Pulses: Normal pulses.     Heart sounds: Normal heart sounds. No murmur heard.    No friction rub. No gallop.  Pulmonary:     Effort: Pulmonary effort is normal. No respiratory distress.     Breath sounds: Normal breath sounds. No stridor. No wheezing, rhonchi or rales.  Chest:     Chest wall: No tenderness.  Musculoskeletal:        General: Swelling and tenderness present. No deformity or signs of injury.     Cervical back: Normal range of motion and neck supple.     Right lower leg: No edema.     Left lower leg: No edema.  Skin:    General: Skin is warm and dry.     Capillary Refill: Capillary refill takes less than 2 seconds.     Coloration: Skin is not jaundiced or pale.     Findings: No bruising, erythema, lesion or rash.  Neurological:     General: No focal deficit present.     Mental Status: She is alert and oriented to person, place, and  time. Mental status is at baseline.  Psychiatric:        Mood and Affect: Mood normal.        Behavior: Behavior normal.        Thought Content: Thought content normal.        Judgment: Judgment normal.     Results for orders placed or performed during the hospital encounter of 07/12/23  Glucose, capillary  Result Value Ref Range   Glucose-Capillary 154 (H) 70 - 99 mg/dL   Comment 1 IN EPIC       Assessment & Plan:   Problem List Items Addressed This Visit   None Visit Diagnoses     Bilateral wrist pain    -  Primary   Concern for fracture. Will send for x-rays. Treat with toradol and baclofen. Await results.   Relevant Medications   ketorolac (TORADOL) injection 60 mg   Other Relevant Orders   DG Wrist Complete Right   DG Wrist Complete Left   Right elbow pain       Concern for fracture. Will send for x-rays. Treat with toradol and baclofen. Await results.   Relevant Medications   ketorolac (TORADOL) injection 60 mg   Other Relevant Orders   DG Elbow Complete Right   Acute pain of right knee       Concern for fracture. Will send for x-rays. Treat with toradol and baclofen. Await results.   Relevant Medications   ketorolac (TORADOL) injection 60 mg   Other Relevant Orders   DG Knee Complete 4 Views Right   Acute midline low back pain without sciatica       Concern for fracture. Will send for x-rays. Treat with toradol and baclofen. Await results.   Relevant Medications   ketorolac (TORADOL) injection 60 mg   baclofen (LIORESAL) 10 MG tablet   Other Relevant Orders   DG Lumbar Spine Complete        Follow up plan: Return if symptoms worsen or fail to improve.

## 2023-10-25 ENCOUNTER — Encounter: Payer: Self-pay | Admitting: Family Medicine

## 2023-11-21 ENCOUNTER — Encounter: Payer: Self-pay | Admitting: Family Medicine

## 2023-12-05 ENCOUNTER — Ambulatory Visit (INDEPENDENT_AMBULATORY_CARE_PROVIDER_SITE_OTHER): Payer: BC Managed Care – PPO | Admitting: Family Medicine

## 2023-12-05 ENCOUNTER — Encounter: Payer: Self-pay | Admitting: Family Medicine

## 2023-12-05 DIAGNOSIS — R7301 Impaired fasting glucose: Secondary | ICD-10-CM | POA: Diagnosis not present

## 2023-12-05 DIAGNOSIS — Z23 Encounter for immunization: Secondary | ICD-10-CM | POA: Diagnosis not present

## 2023-12-05 LAB — BAYER DCA HB A1C WAIVED: HB A1C (BAYER DCA - WAIVED): 6 % — ABNORMAL HIGH (ref 4.8–5.6)

## 2023-12-05 MED ORDER — SEMAGLUTIDE-WEIGHT MANAGEMENT 1 MG/0.5ML ~~LOC~~ SOAJ: 1.0000 mg | SUBCUTANEOUS | 1 refills | Status: AC

## 2023-12-05 MED ORDER — SEMAGLUTIDE-WEIGHT MANAGEMENT 0.25 MG/0.5ML ~~LOC~~ SOAJ: 0.2500 mg | SUBCUTANEOUS | 0 refills | Status: AC

## 2023-12-05 MED ORDER — SEMAGLUTIDE-WEIGHT MANAGEMENT 0.5 MG/0.5ML ~~LOC~~ SOAJ: 0.5000 mg | SUBCUTANEOUS | 0 refills | Status: AC

## 2023-12-05 NOTE — Assessment & Plan Note (Signed)
Stable with A1c of 6.0. Continue diet and exercise. Trying to get wegovy for her. Call with any concerns.

## 2023-12-05 NOTE — Progress Notes (Signed)
BP 118/78   Pulse 74   Ht 5\' 3"  (1.6 m)   Wt 253 lb 12.8 oz (115.1 kg)   LMP  (LMP Unknown)   SpO2 96%   BMI 44.96 kg/m    Subjective:    Patient ID: Amanda Moran, female    DOB: 12-15-1971, 52 y.o.   MRN: [id number]  HPI: Amanda Moran is a 52 y.o. female  Chief Complaint  Patient presents with   Weight Loss    Patient says she would like to discuss different treatment options due to the cost of her current prescription of Contrave. Patient says she was doing well on Contrave, but it was too expensive.    OBESITY Duration: chronic Previous attempts at weight loss: has been cutting back on calories, eating a calorie deficit, has been walking 30 minutes 1-2x a day Complications of obesity: IFG Peak weight: 310lbs Weight loss goal: 200lbs Weight loss to date: 57lbs Requesting obesity pharmacotherapy: yes Current weight loss supplements/medications: no Previous weight loss supplements/meds: no  Clinical coverage for weight loss GLP's  Medication being dispensed is Wegovy 3 mL/28 day. Titration doses are 2 mL/28 days.  []  Product being prescribed is FDA approved for the indication, age, weight (if applicable) and not does not exceed dosing limits per the Prescribing Information per the clinical conditions for use.  [x]  Patient's baseline weight measured within the last 45 days as required by provider before dispensing.  [x]  Patient is new to therapy and One of the following:  [x]  The beneficiary is 51 years of age or over and has ONE of the following:  [x]  A BMI greater than or equal to 30 kg/m2  []  A BMI greater than or equal to 27 kg/m2 with at least one weight-related comorbidity/risk factor/complication (i.e. hypertension, type 2 diabetes, obstructive sleep apnea, cardiovascular disease, dyslipidemia)  [x]  If patient has one weight-related comorbidity/risk factor/complication (i.e. hypertension, type 2 diabetes, obstructive sleep apnea, cardiovascular disease,  dyslipidemia), please list  Patient suffers from weight-related comorbidity/risk factor/complication IFG  []  The beneficiary is 18 years of age or older with a BMI greater than or equal to 27 kg/m2 AND has established cardiovascular disease (CVD) defined as having a history of myocardial infarction, stroke, or symptomatic peripheral disease, to be documented on the PA form. AND  [x]  The beneficiary is currently on and will continue lifestyle modification including structured nutrition and physical activity, unless physical activity is not clinically appropriate at the time GLP1 therapy commences AND  [x]  The beneficiary will NOT be using the requested agent in combination with another GLP-1 receptor agonist agent AND  [x]  The beneficiary does NOT have any FDA-labeled contraindications to the requested agent, including pregnancy, lactation, history of medullary thyroid cancer or multiple endocrine neoplasia type II.   Last height recorded  Ht 5\' 3"  (1.6 m)   Last weight recorded  Last Weight  Most recent update: 12/05/2023 10:07 AM    Weight  115.1 kg (253 lb 12.8 oz)            Last BMI recorded Body mass index is 44.96 kg/m.       Relevant past medical, surgical, family and social history reviewed and updated as indicated. Interim medical history since our last visit reviewed. Allergies and medications reviewed and updated.  Review of Systems  Constitutional: Negative.   Respiratory: Negative.    Cardiovascular: Negative.   Gastrointestinal: Negative.   Musculoskeletal: Negative.   Neurological: Negative.   Psychiatric/Behavioral: Negative.  Per HPI unless specifically indicated above     Objective:    BP 118/78   Pulse 74   Ht 5\' 3"  (1.6 m)   Wt 253 lb 12.8 oz (115.1 kg)   LMP  (LMP Unknown)   SpO2 96%   BMI 44.96 kg/m   Wt Readings from Last 3 Encounters:  12/05/23 253 lb 12.8 oz (115.1 kg)  10/23/23 247 lb 6.4 oz (112.2 kg)  07/12/23 255 lb 3.2 oz  (115.8 kg)    Physical Exam Vitals and nursing note reviewed.  Constitutional:      General: She is not in acute distress.    Appearance: Normal appearance. She is obese. She is not ill-appearing, toxic-appearing or diaphoretic.  HENT:     Head: Normocephalic and atraumatic.     Right Ear: External ear normal.     Left Ear: External ear normal.     Nose: Nose normal.     Mouth/Throat:     Mouth: Mucous membranes are moist.     Pharynx: Oropharynx is clear.  Eyes:     General: No scleral icterus.       Right eye: No discharge.        Left eye: No discharge.     Extraocular Movements: Extraocular movements intact.     Conjunctiva/sclera: Conjunctivae normal.     Pupils: Pupils are equal, round, and reactive to light.  Cardiovascular:     Rate and Rhythm: Normal rate and regular rhythm.     Pulses: Normal pulses.     Heart sounds: Normal heart sounds. No murmur heard.    No friction rub. No gallop.  Pulmonary:     Effort: Pulmonary effort is normal. No respiratory distress.     Breath sounds: Normal breath sounds. No stridor. No wheezing, rhonchi or rales.  Chest:     Chest wall: No tenderness.  Musculoskeletal:        General: Normal range of motion.     Cervical back: Normal range of motion and neck supple.  Skin:    General: Skin is warm and dry.     Capillary Refill: Capillary refill takes less than 2 seconds.     Coloration: Skin is not jaundiced or pale.     Findings: No bruising, erythema, lesion or rash.  Neurological:     General: No focal deficit present.     Mental Status: She is alert and oriented to person, place, and time. Mental status is at baseline.  Psychiatric:        Mood and Affect: Mood normal.        Behavior: Behavior normal.        Thought Content: Thought content normal.        Judgment: Judgment normal.     Results for orders placed or performed during the hospital encounter of 07/12/23  Glucose, capillary  Result Value Ref Range    Glucose-Capillary 154 (H) 70 - 99 mg/dL   Comment 1 IN EPIC       Assessment & Plan:   Problem List Items Addressed This Visit       Endocrine   IFG (impaired fasting glucose)    Stable with A1c of 6.0. Continue diet and exercise. Trying to get wegovy for her. Call with any concerns.       Relevant Orders   Bayer DCA Hb A1c Waived     Other   Morbid obesity (HCC) - Primary    BMI of 44.96. She would  benefit from wegovy. Will start pending approval. Call with any concerns. Recheck 6-8 weeks after starting medicine for tolerance.      Relevant Medications   Semaglutide-Weight Management 0.25 MG/0.5ML SOAJ   Semaglutide-Weight Management 0.5 MG/0.5ML SOAJ (Start on 01/03/2024)   Semaglutide-Weight Management 1 MG/0.5ML SOAJ (Start on 02/01/2024)     Follow up plan: Return 6-8 weeks after starting medicine.

## 2023-12-05 NOTE — Telephone Encounter (Unsigned)
Copied from CRM [id number]. Topic: General - Other >> Dec 05, 2023 11:20 AM Phill Myron wrote: Semaglutide-Weight Management 0.25 MG/0.5ML [name] [[id number]].Marland KitchenMarland KitchenMedication needs Pre Authorization per pharmacy .Marland Kitchen Pt Amanda Moran would like a call back please

## 2023-12-05 NOTE — Assessment & Plan Note (Signed)
BMI of 44.96. She would benefit from wegovy. Will start pending approval. Call with any concerns. Recheck 6-8 weeks after starting medicine for tolerance.

## 2023-12-05 NOTE — Telephone Encounter (Unsigned)
Copied from CRM [id number]. Topic: General - Other >> Dec 05, 2023 11:20 AM Phill Myron wrote: Semaglutide-Weight Management 0.25 MG/0.5ML [name] [[id number]].Marland KitchenMarland KitchenMedication needs Pre Authorization per pharmacy .Marland Kitchen Pt Dobbs would like a call back please

## 2024-01-18 ENCOUNTER — Telehealth: Payer: Self-pay | Admitting: Family Medicine

## 2024-01-18 NOTE — Telephone Encounter (Signed)
Copied from CRM [id number]. Topic: General - Other >> Jan 18, 2024  1:26 PM Ja-Kwan M wrote: Reason for CRM: Pt stated that it is time for her Rx to increased. Pt requests a new Rx increasing the dose for Semaglutide-Weight Management. Cb# [id number]

## 2024-01-18 NOTE — Telephone Encounter (Signed)
Needs follow-up

## 2024-01-18 NOTE — Telephone Encounter (Signed)
Called to schedule mailbox full could not leave message

## 2024-01-19 NOTE — Telephone Encounter (Signed)
Appointment has been made

## 2024-02-19 ENCOUNTER — Encounter: Payer: Self-pay | Admitting: Family Medicine

## 2024-02-19 ENCOUNTER — Ambulatory Visit (INDEPENDENT_AMBULATORY_CARE_PROVIDER_SITE_OTHER): Payer: BC Managed Care – PPO | Admitting: Family Medicine

## 2024-02-19 MED ORDER — WEGOVY 2.4 MG/0.75ML ~~LOC~~ SOAJ: 2.4000 mg | SUBCUTANEOUS | 1 refills | Status: DC

## 2024-02-19 MED ORDER — WEGOVY 1.7 MG/0.75ML ~~LOC~~ SOAJ: 1.7000 mg | SUBCUTANEOUS | 1 refills | Status: DC

## 2024-02-19 NOTE — Progress Notes (Signed)
 BP 106/71 (BP Location: Left Arm, Patient Position: Sitting, Cuff Size: Large)   Pulse 82   Temp 98.4 F (36.9 C) (Oral)   Ht 5\' 3"  (1.6 m)   Wt 237 lb (107.5 kg)   LMP  (LMP Unknown)   SpO2 98%   BMI 41.98 kg/m    Subjective:    Patient ID: Amanda Moran, female    DOB: 17-Aug-1971, 53 y.o.   MRN: [id number]  HPI: Amanda Moran is a 53 y.o. female  Chief Complaint  Patient presents with   Obesity   OBESITY Duration: chronic Previous attempts at weight loss: has been cutting back on calories, eating a calorie deficit, has been walking 30 minutes 1-2x a day  Complications of obesity: IFG Peak weight: 310lbs (253 when starting wegovy) Weight loss goal: 200lbs Weight loss to date: 73lbs (17 since starting wegovy) Requesting obesity pharmacotherapy: yes Current weight loss supplements/medications: yes Previous weight loss supplements/meds: no  Relevant past medical, surgical, family and social history reviewed and updated as indicated. Interim medical history since our last visit reviewed. Allergies and medications reviewed and updated.  Review of Systems  Constitutional: Negative.   Respiratory: Negative.    Cardiovascular: Negative.   Musculoskeletal: Negative.   Neurological: Negative.     Per HPI unless specifically indicated above     Objective:    BP 106/71 (BP Location: Left Arm, Patient Position: Sitting, Cuff Size: Large)   Pulse 82   Temp 98.4 F (36.9 C) (Oral)   Ht 5\' 3"  (1.6 m)   Wt 237 lb (107.5 kg)   LMP  (LMP Unknown)   SpO2 98%   BMI 41.98 kg/m   Wt Readings from Last 3 Encounters:  02/19/24 237 lb (107.5 kg)  12/05/23 253 lb 12.8 oz (115.1 kg)  10/23/23 247 lb 6.4 oz (112.2 kg)    Physical Exam Vitals and nursing note reviewed.  Constitutional:      General: She is not in acute distress.    Appearance: Normal appearance. She is not ill-appearing, toxic-appearing or diaphoretic.  HENT:     Head: Normocephalic and atraumatic.      Right Ear: External ear normal.     Left Ear: External ear normal.     Nose: Nose normal.     Mouth/Throat:     Mouth: Mucous membranes are moist.     Pharynx: Oropharynx is clear.  Eyes:     General: No scleral icterus.       Right eye: No discharge.        Left eye: No discharge.     Extraocular Movements: Extraocular movements intact.     Conjunctiva/sclera: Conjunctivae normal.     Pupils: Pupils are equal, round, and reactive to light.  Cardiovascular:     Rate and Rhythm: Normal rate and regular rhythm.     Pulses: Normal pulses.     Heart sounds: Normal heart sounds. No murmur heard.    No friction rub. No gallop.  Pulmonary:     Effort: Pulmonary effort is normal. No respiratory distress.     Breath sounds: Normal breath sounds. No stridor. No wheezing, rhonchi or rales.  Chest:     Chest wall: No tenderness.  Musculoskeletal:        General: Normal range of motion.     Cervical back: Normal range of motion and neck supple.  Skin:    General: Skin is warm and dry.     Capillary Refill: Capillary refill  takes less than 2 seconds.     Coloration: Skin is not jaundiced or pale.     Findings: No bruising, erythema, lesion or rash.  Neurological:     General: No focal deficit present.     Mental Status: She is alert and oriented to person, place, and time. Mental status is at baseline.  Psychiatric:        Mood and Affect: Mood normal.        Behavior: Behavior normal.        Thought Content: Thought content normal.        Judgment: Judgment normal.     Results for orders placed or performed in visit on 12/05/23  Bayer DCA Hb A1c Waived   Collection Time: 12/05/23 10:18 AM  Result Value Ref Range   HB A1C (BAYER DCA - WAIVED) 6.0 (H) 4.8 - 5.6 %      Assessment & Plan:   Problem List Items Addressed This Visit       Other   Morbid obesity (HCC) - Primary   Doing great! Tolerating her medicine well. Down 17lbs since starting wegovy for 7% of her body weight  and not on max dose yet. Will continue to titrate her up and recheck in 3 months. Call with any concerns.       Relevant Medications   Semaglutide-Weight Management (WEGOVY) 1.7 MG/0.75ML SOAJ   Semaglutide-Weight Management (WEGOVY) 2.4 MG/0.75ML SOAJ     Follow up plan: Return in about 3 months (around 05/18/2024).

## 2024-02-19 NOTE — Assessment & Plan Note (Signed)
 Doing great! Tolerating her medicine well. Down 17lbs since starting wegovy for 7% of her body weight and not on max dose yet. Will continue to titrate her up and recheck in 3 months. Call with any concerns.

## 2024-03-12 ENCOUNTER — Other Ambulatory Visit: Payer: Self-pay | Admitting: Family Medicine

## 2024-03-13 NOTE — Telephone Encounter (Signed)
 Requested Prescriptions  Pending Prescriptions Disp Refills   WEGOVY 0.5 MG/0.5ML SOAJ [Pharmacy Med Name: WEGOVY 0.5 MG/0.5 ML PEN]      Sig: INJECT 0.5 MG INTO THE SKIN ONCE A WEEK FOR 28 DAYS.     Endocrinology:  Diabetes - GLP-1 Receptor Agonists - semaglutide Failed - 03/13/2024 12:55 PM      Failed - HBA1C in normal range and within 180 days    Hemoglobin A1C  Date Value Ref Range Status  08/22/2016 5.7  Final   HB A1C (BAYER DCA - WAIVED)  Date Value Ref Range Status  12/05/2023 6.0 (H) 4.8 - 5.6 % Final    Comment:             Prediabetes: 5.7 - 6.4          Diabetes: >6.4          Glycemic control for adults with diabetes: <7.0          Passed - Cr in normal range and within 360 days    Creatinine  Date Value Ref Range Status  04/24/2015 0.61 mg/dL Final    Comment:    4.40-3.47 NOTE: New Reference Range  03/03/15    Creatinine, Ser  Date Value Ref Range Status  06/08/2023 0.76 0.57 - 1.00 mg/dL Final         Passed - Valid encounter within last 6 months    Recent Outpatient Visits           3 months ago Morbid obesity (HCC)   [place] [facility] [place], Megan P, DO   4 months ago Bilateral wrist pain   [place] [facility] [place], Megan P, DO   5 months ago Nicotine dependence, cigarettes, uncomplicated   [place] [facility] [place], Megan P, DO   6 months ago Nicotine dependence, cigarettes, uncomplicated   [place] [facility] [place], [place] T, NP   8 months ago Acute anxiety   [place] [facility] [place], Oswaldo Conroy, PA-C       Future Appointments             In 3 weeks Johnson, Megan P, DO [place] [facility], [facility]             WEGOVY 0.25 MG/0.5ML SOAJ [Pharmacy Med Name: WEGOVY 0.25 MG/0.5 ML PEN]      Sig: INJECT 0.25 MG INTO THE SKIN ONCE A WEEK FOR 28 DAYS     Endocrinology:  Diabetes - GLP-1 Receptor Agonists - semaglutide Failed -  03/13/2024 12:55 PM      Failed - HBA1C in normal range and within 180 days    Hemoglobin A1C  Date Value Ref Range Status  08/22/2016 5.7  Final   HB A1C (BAYER DCA - WAIVED)  Date Value Ref Range Status  12/05/2023 6.0 (H) 4.8 - 5.6 % Final    Comment:             Prediabetes: 5.7 - 6.4          Diabetes: >6.4          Glycemic control for adults with diabetes: <7.0          Passed - Cr in normal range and within 360 days    Creatinine  Date Value Ref Range Status  04/24/2015 0.61 mg/dL Final    Comment:    4.25-9.56 NOTE: New Reference Range  03/03/15    Creatinine, Ser  Date Value Ref Range Status  06/08/2023 0.76 0.57 - 1.00 mg/dL Final         Passed - Valid encounter within last 6 months    Recent Outpatient Visits           3 months ago Morbid obesity (HCC)   [place] [facility] [place], Megan P, DO   4 months ago Bilateral wrist pain   [place] [facility] [place], Megan P, DO   5 months ago Nicotine dependence, cigarettes, uncomplicated   [place] [facility], Megan P, DO   6 months ago Nicotine dependence, cigarettes, uncomplicated   [place] [facility] [place], Peerless T, NP   8 months ago Acute anxiety   [place] [facility] [place], Oswaldo Conroy, PA-C       Future Appointments             In 3 weeks Laural Benes, Oralia Rud, DO [place] [facility], [facility]

## 2024-03-14 ENCOUNTER — Telehealth: Payer: Self-pay

## 2024-03-14 NOTE — Telephone Encounter (Signed)
 Tried calling patient to discuss Wegovy. No answer and no VM set up. The wegovy that was denied was the 0.25 dose and the 0.5 dose. Refill request was sent to [place] from the pharmacy for these. Patient should have prescriptions on file at the pharmacy for the 1.7 mg and the 2.4 mg doses. These were sent in 02/19/24.   OK for E2C2 to speak to the patient and notify her of the above if she calls back.

## 2024-03-14 NOTE — Telephone Encounter (Signed)
 Copied from CRM [id number]. Topic: Clinical - Prescription Issue >> Mar 13, 2024  2:40 PM Franchot Heidelberg wrote: Reason for CRM: Pt called wanting to know why her Wegovy injections were denied? Please advise  Best contact: [phone]

## 2024-03-19 ENCOUNTER — Telehealth: Payer: Self-pay

## 2024-03-19 NOTE — Telephone Encounter (Signed)
 See other phone encounter.

## 2024-03-19 NOTE — Telephone Encounter (Signed)
 Called and discussed medication with the patient. Advised patient that she has a refill on the 1.7 mg to take and then she has the prescription at the pharmacy to be filled for the 2.4 mg. Patient verbalized understanding.

## 2024-03-19 NOTE — Telephone Encounter (Signed)
 Copied from CRM [id number]. Topic: Clinical - Medication Question >> Mar 15, 2024  8:22 AM Almira Coaster wrote: Reason for CRM: Patient would like to speak with [place] regarding wegovy, relayed the message Tierney Behl left but she would like to know if it's okay for her to still use the 1.7 mg dose.

## 2024-03-21 ENCOUNTER — Ambulatory Visit: Payer: Self-pay

## 2024-03-21 NOTE — Telephone Encounter (Signed)
  Chief Complaint: hoarseness Symptoms: hoarseness- patient states no pain, some drainage in throat Frequency: started Monday Pertinent Negatives: Patient denies fever, congestion, sore throat Disposition: [] ED /[] Urgent Care (no appt availability in office) / [x] Appointment(In office/virtual)/ []  [place] [facility]/ [] Home Care/ [] Refused Recommended Disposition /[] [place] [facility]/ []  Follow-up with PCP Additional Notes: Patient uses her voice at work and has been unable to speak since Monday

## 2024-03-21 NOTE — Telephone Encounter (Signed)
 1st attempt to reach patient, received autorecorded message of call cannot be completed at this time.   Copied from CRM [id number]. Topic: Clinical - Medical Advice >> Mar 21, 2024 11:41 AM Marland Kitchen D wrote: Patient loss her voice she feels fine started over the weekend. 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 It's not getting better or worse she just can't talk. She wants to know if she can be given something to help her voice come back because she has to work.

## 2024-03-21 NOTE — Telephone Encounter (Signed)
 Attempt 2, call could not be completed as dialed.

## 2024-03-21 NOTE — Telephone Encounter (Signed)
 Unable to reach patient after 3 attempts by E2C2 NT, routing to the provider for resolution per protocol.

## 2024-03-21 NOTE — Telephone Encounter (Signed)
 Reason for Disposition . [1] MODERATE to SEVERE hoarseness (e.g., interferes with work) AND [2] professional voice user (e.g., Lobbyist, singer, Runner, broadcasting/film/video)  (Exception: Current common cold or mild URI symptoms.)  Answer Assessment - Initial Assessment Questions 1. DESCRIPTION: "Describe your voice." (e.g., coarse, raspy, weaker, airy, scratchy, deeper)     Patient is very hoarse 2. SEVERITY: "How bad is it?"   - MILD: doesn't interfere with normal activities   - MODERATE: interferes with normal activities such as school or work   - SEVERE: only able to whisper.     Moderate/severe 3. ONSET: "When did the hoarseness begin?"     Monday 4. COUGH: "Is there a cough?" If Yes, ask: "How bad is it?"     no 5. FEVER: "Do you have a fever?" If Yes, ask: "What is your temperature, how was it measured, and when did it start?"     no  8. CAUSE: "What do you think is causing the hoarseness?"     unsure 9. OTHER SYMPTOMS: "Do you have any other symptoms?" (e.g., breathing difficulty, fever, foreign body, lymph node swelling in neck, rash, sore throat, weight loss)     Drainage in throat- no pain or other symptoms  Protocols used: [facility]

## 2024-03-22 ENCOUNTER — Ambulatory Visit (INDEPENDENT_AMBULATORY_CARE_PROVIDER_SITE_OTHER): Admitting: Internal Medicine

## 2024-03-22 ENCOUNTER — Encounter: Payer: Self-pay | Admitting: Internal Medicine

## 2024-03-22 VITALS — BP 122/84 | Ht 63.0 in | Wt 234.0 lb

## 2024-03-22 DIAGNOSIS — J040 Acute laryngitis: Secondary | ICD-10-CM

## 2024-03-22 DIAGNOSIS — J301 Allergic rhinitis due to pollen: Secondary | ICD-10-CM

## 2024-03-22 DIAGNOSIS — K219 Gastro-esophageal reflux disease without esophagitis: Secondary | ICD-10-CM | POA: Diagnosis not present

## 2024-03-22 NOTE — Patient Instructions (Signed)
Laryngitis  Laryngitis is irritation and swelling (inflammation) of your vocal cords. It causes your voice to sound hoarse and may cause you to lose your voice. Depending on the cause, this condition may go away after a short time or may last for more than 3 weeks. Treatment often involves resting your voice and using medicines to soothe your throat. What are the causes? Laryngitis that lasts for a short time may be caused by: An infection caused by a virus. Lots of talking, yelling, or singing. This is also called vocal strain. An infection caused by bacteria. Laryngitis that lasts for more than 3 weeks can be caused by: Lots of talking, yelling, or singing. An injury to the vocal cords. Acid reflux. Allergies. Sinus infection. Mucus draining from the nose down the throat (postnasal drip). Smoking. Drinking too much alcohol. Breathing in chemicals or dust. Having growths on the vocal cords. What increases the risk? Smoking. Drinking too much alcohol. Having allergies. Breathing in fumes at work. What are the signs or symptoms? A change in your voice. It may sound low and hoarse. Loss of voice. Dry cough. Sore throat. Dry throat. Stuffy nose. How is this treated? Treatment depends on what is causing the laryngitis. Usually, treatment includes: Resting your voice. Using medicines to soothe your throat. If your laryngitis is caused by an infection from bacteria, you may need to take antibiotics. If your laryngitis is caused by a growth on your vocal cords, you may need to have a surgery to remove it. Follow these instructions at home: Medicines Take over-the-counter and prescription medicines only as told by your doctor. If you were prescribed an antibiotic medicine, take it as told by your doctor. Do not stop taking it even if you start to feel better. Use throat lozenges or sprays to soothe your throat as told by your doctor. General instructions  Talk as little as  possible. To do this: Avoid whispering. Write instead of talking. Do this until your voice is back to normal. Rinse your mouth (gargle) with a salt water mixture 3-4 times a day or as needed. To make salt water, dissolve -1 tsp (3-6 g) of salt in 1 cup (237 mL) of warm water. Do not swallow this mixture. Drink enough fluid to keep your pee (urine) pale yellow. Breathe in moist air. Use a humidifier if you live in a dry climate. Do not smoke or use any products that contain nicotine or tobacco. If you need help quitting, ask your doctor. Contact a doctor if: You have a fever. Your pain is worse. Your symptoms do not get better in 2 weeks. Get help right away if: You cough up blood. You have trouble swallowing. You have trouble breathing. Summary Laryngitis is inflammation of your vocal cords. This condition causes your voice to sound low and hoarse. Rest your voice by talking as little as possible. Also avoid whispering. Get help right away if you have trouble swallowing or breathing or if you cough up blood. This information is not intended to replace advice given to you by your health care provider. Make sure you discuss any questions you have with your health care provider. Document Revised: 03/01/2021 Document Reviewed: 03/01/2021 Elsevier Patient Education  2024 [employer].

## 2024-03-22 NOTE — Progress Notes (Signed)
 Subjective:    Patient ID: Amanda Moran, female    DOB: 1971/06/15, 53 y.o.   MRN: [id number]  HPI  Discussed the use of AI scribe software for clinical note transcription with the patient, who gave verbal consent to proceed.  Amanda Moran is a 53 year old female who presents with hoarseness and voice loss after singing at a revival.  She experienced hoarseness and voice loss after singing at a revival on Sunday. The symptoms worsened by Monday morning, and she has been unable to speak properly since. She has had similar episodes in the past that resolved with rest and tea, but this episode has persisted for six days.  She has a runny nose and a mild cough with yellow phlegm. No headache, nasal congestion, ear pain, sore throat, shortness of breath, nausea, vomiting, diarrhea, fever, chills, or body aches.  She mentions experiencing heartburn and reflux, which is unusual for her. Her sister provided her with Tums to alleviate the symptoms.  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 She works from home and has been doing so for two years. She is concerned about her short-term disability, which requires her to be out for seven consecutive days before it kicks in.       Review of Systems   Past Medical History:  Diagnosis Date   Abnormal uterine bleeding 05/20/2015   Overview:  Hgb 7.9 on 5/19 preoperatively. At [facility] preop.    Acanthosis nigricans    Anemia    Anxiety    Diabetes mellitus without complication (HCC)    patient unaware   Fibroids    Fibroids    Headache    Iron deficiency anemia     Current Outpatient Medications  Medication Sig Dispense Refill   baclofen (LIORESAL) 10 MG tablet Take 1 tablet (10 mg total) by mouth at bedtime as needed. (Patient not taking: Reported on 02/19/2024) 30 each 0   diclofenac Sodium (VOLTAREN) 1 % GEL Apply 4 g topically 4 (four) times daily. 350 g 3    etodolac (LODINE) 500 MG tablet TAKE 1 TABLET BY MOUTH TWICE DAILY 180 tablet 0   hydrOXYzine (ATARAX) 25 MG tablet TAKE 1 TABLET BY MOUTH EVERY 8 HOURS AS NEEDED FOR ANXIETY 90 tablet 1   nicotine (NICODERM CQ - DOSED IN MG/24 HR) 7 mg/24hr patch Place 1 patch (7 mg total) onto the skin daily. (Patient not taking: Reported on 02/19/2024) 28 patch 3   Semaglutide-Weight Management (WEGOVY) 1.7 MG/0.75ML SOAJ Inject 1.7 mg into the skin once a week. 3 mL 1   Semaglutide-Weight Management (WEGOVY) 2.4 MG/0.75ML SOAJ Inject 2.4 mg into the skin once a week. 9 mL 1   traZODone (DESYREL) 50 MG tablet Take 0.5-1 tablets (25-50 mg total) by mouth at bedtime as needed for sleep. (Patient not taking: Reported on 02/19/2024) 30 tablet 2   No current facility-administered medications for this visit.    No Known Allergies  Family History  Problem Relation Age of Onset   Alcohol abuse Father    Arthritis Father    Diabetes Father    Drug abuse Father    Cancer Father        pancreatic   Diabetes Brother    Cancer Mother        cervical   Breast cancer Maternal Aunt    Breast cancer Cousin  mat cousins    Social History   Socioeconomic History   Marital status: Married    Spouse name: Not on file   Number of children: Not on file   Years of education: Not on file   Highest education level: Not on file  Occupational History   Not on file  Tobacco Use   Smoking status: Former    Current packs/day: 1.50    Types: Cigarettes   Smokeless tobacco: Never  Vaping Use   Vaping status: Never Used  Substance and Sexual Activity   Alcohol use: Yes    Comment: socially   Drug use: No   Sexual activity: Yes  Other Topics Concern   Not on file  Social History Narrative   Not on file   Social Drivers of Health   Financial Resource Strain: Not on file  Food Insecurity: Not on file  Transportation Needs: Not on file  Physical Activity: Not on file  Stress: Not on file  Social  Connections: Not on file  Intimate Partner Violence: Not on file     Constitutional: Denies fever, malaise, fatigue, headache or abrupt weight changes.  HEENT: Patient reports runny nose, hoarseness.  Denies eye pain, eye redness, ear pain, ringing in the ears, wax buildup, nasal congestion, bloody nose, or sore throat. Respiratory: Pt reports cough. Denies difficulty breathing, shortness of breath.   Cardiovascular: Denies chest pain, chest tightness, palpitations or swelling in the hands or feet.  Gastrointestinal: Patient reports reflux.  Denies abdominal pain, bloating, constipation, diarrhea or blood in the stool.  Neurological: Denies dizziness, difficulty with memory, difficulty with speech or problems with balance and coordination.    No other specific complaints in a complete review of systems (except as listed in HPI above).      Objective:   Physical Exam BP 122/84 (BP Location: Left Arm, Patient Position: Sitting, Cuff Size: Normal)   Ht 5\' 3"  (1.6 m)   Wt 234 lb (106.1 kg)   LMP  (LMP Unknown)   BMI 41.45 kg/m   Wt Readings from Last 3 Encounters:  02/19/24 237 lb (107.5 kg)  12/05/23 253 lb 12.8 oz (115.1 kg)  10/23/23 247 lb 6.4 oz (112.2 kg)    General: Appears her stated age, obese, in NAD. Skin: Warm, dry and intact.  HEENT: Head: normal shape and size; Eyes: sclera white, no icterus, conjunctiva pink, PERRLA and EOMs intact; Throat/Mouth: Teeth present, mucosa pink and moist, + PND, no exudate, lesions or ulcerations noted.  Neck: Bilateral anterior cervical adenopathy noted. Cardiovascular: Normal rate and rhythm.  Pulmonary/Chest: Normal effort and positive vesicular breath sounds. No respiratory distress. No wheezes, rales or ronchi noted.  Abdomen: Soft and nontender.  Neurological: Alert and oriented.   BMET    Component Value Date/Time   NA 142 06/08/2023 0845   NA 136 04/24/2015 1326   K 3.6 06/08/2023 0845   K 2.9 (L) 04/24/2015 1326   CL 106  06/08/2023 0845   CL 104 04/24/2015 1326   CO2 21 06/08/2023 0845   CO2 25 04/24/2015 1326   GLUCOSE 141 (H) 06/08/2023 0845   GLUCOSE 89 01/30/2017 0853   GLUCOSE 177 (H) 04/24/2015 1326   BUN 8 06/08/2023 0845   BUN 5 (L) 04/24/2015 1326   CREATININE 0.76 06/08/2023 0845   CREATININE 0.61 04/24/2015 1326   CALCIUM 9.2 06/08/2023 0845   CALCIUM 8.2 (L) 04/24/2015 1326   GFRNONAA 107 04/07/2020 1636   GFRNONAA >60 04/24/2015 1326  GFRAA 124 04/07/2020 1636   GFRAA >60 04/24/2015 1326    Lipid Panel     Component Value Date/Time   CHOL 241 (H) 06/08/2023 0845   CHOL 203 (H) 08/22/2016 0814   TRIG 91 06/08/2023 0845   TRIG 85 08/22/2016 0814   HDL 49 06/08/2023 0845   VLDL 17 08/22/2016 0814   LDLCALC 176 (H) 06/08/2023 0845    CBC    Component Value Date/Time   WBC 5.5 06/08/2023 0845   WBC 7.1 01/30/2017 0853   RBC 4.73 06/08/2023 0845   RBC 4.58 01/30/2017 0853   HGB 13.8 06/08/2023 0845   HCT 42.1 06/08/2023 0845   PLT 171 06/08/2023 0845   MCV 89 06/08/2023 0845   MCV 62 (L) 04/24/2015 1326   MCH 29.2 06/08/2023 0845   MCH 29.9 01/30/2017 0853   MCHC 32.8 06/08/2023 0845   MCHC 34.0 01/30/2017 0853   RDW 14.2 06/08/2023 0845   RDW 24.4 (H) 04/24/2015 1326   LYMPHSABS 1.6 06/08/2023 0845   LYMPHSABS 1.4 04/24/2015 1326   MONOABS 0.5 01/30/2017 0853   MONOABS 0.3 04/24/2015 1326   EOSABS 0.0 06/08/2023 0845   EOSABS 0.1 04/24/2015 1326   BASOSABS 0.0 06/08/2023 0845   BASOSABS 0.0 04/24/2015 1326    Hgb A1C Lab Results  Component Value Date   HGBA1C 6.0 (H) 12/05/2023            Assessment & Plan:   Assessment and Plan    Hoarseness Hoarseness likely due to postnasal drip from allergies and reflux. Treatment targets both conditions for symptom relief. Expected improvement within a week. Recurrence after stopping medication requires restarting treatment. - Recommend over-the-counter antihistamine (Allegra, Zyrtec, or Claritin) twice daily  for 3 days, then once daily for 4 days. - Recommend over-the-counter omeprazole 20 mg once daily in the morning for 1 week. - Provide work note to return on Monday. - Advise follow-up with Doctor Laural Benes if symptoms do not improve over the weekend for further recommendations and potential short-term disability paperwork.  Allergic Rhinitis Postnasal drip from allergies contributing to hoarseness. Antihistamines to reduce symptoms and inflammation. - Recommend over-the-counter antihistamine (Allegra, Zyrtec, or Claritin) twice daily for 3 days, then once daily for 4 days.  Reflux Reflux symptoms may contribute to hoarseness. Omeprazole recommended to reduce acid production. - Recommend over-the-counter omeprazole 20 mg once daily in the morning for 1 week.       Follow-up with your PCP as previously scheduled Nicki Reaper, NP

## 2024-03-25 ENCOUNTER — Encounter: Payer: Self-pay | Admitting: Family Medicine

## 2024-03-25 ENCOUNTER — Telehealth (INDEPENDENT_AMBULATORY_CARE_PROVIDER_SITE_OTHER): Admitting: Family Medicine

## 2024-03-25 DIAGNOSIS — J040 Acute laryngitis: Secondary | ICD-10-CM

## 2024-03-25 MED ORDER — PREDNISONE 50 MG PO TABS: 50.0000 mg | ORAL_TABLET | Freq: Every day | ORAL | 0 refills | Status: DC

## 2024-03-25 NOTE — Progress Notes (Signed)
 LMP  (LMP Unknown)    Subjective:    Patient ID: Amanda Moran, female    DOB: [birth year], 53 y.o.   MRN: [id number]  HPI: Amanda Moran is a 53 y.o. female  Chief Complaint  Patient presents with   Sore Throat   Cough    Patient stated that she has had a cough and sore throat for about a week. She was seen at a sister practice on Friday and she was dx with allergies. However, she states that she has gotten worse.    UPPER RESPIRATORY TRACT INFECTION Duration: about a week Worst symptom: laryngitis Fever: no Cough: yes Shortness of breath: no Wheezing: no Chest pain: no Chest tightness: no Chest congestion: no Nasal congestion: yes Runny nose: yes Post nasal drip: yes Sneezing: no Sore throat: no Swollen glands: no Sinus pressure: yes Headache: yes Face pain: no Toothache: no Ear pain: no  Ear pressure: no  Eyes red/itching:no Eye drainage/crusting: yes  Vomiting: yes x2 over the weekend Rash: no Fatigue: yes Sick contacts: no Strep contacts: no  Context: stable- slightly better Recurrent sinusitis: no Relief with OTC cold/cough medications: no  Treatments attempted: cold/sinus, mucinex, and anti-histamine    Relevant past medical, surgical, family and social history reviewed and updated as indicated. Interim medical history since our last visit reviewed. Allergies and medications reviewed and updated.  Review of Systems  Constitutional: Negative.   Respiratory: Negative.    Cardiovascular: Negative.   Gastrointestinal: Negative.   Neurological:  Positive for numbness (on her tongue since starting her allergy medicine and the center of her bottom lip). Negative for dizziness, tremors, seizures, syncope, facial asymmetry, speech difficulty, weakness, light-headedness and headaches.  Psychiatric/Behavioral: Negative.      Per HPI unless specifically indicated above     Objective:    LMP  (LMP Unknown)   Wt Readings from Last 3 Encounters:   03/22/24 234 lb (106.1 kg)  02/19/24 237 lb (107.5 kg)  12/05/23 253 lb 12.8 oz (115.1 kg)    Physical Exam Vitals and nursing note reviewed.  Constitutional:      General: She is not in acute distress.    Appearance: Normal appearance. She is well-developed. She is obese. She is not ill-appearing, toxic-appearing or diaphoretic.  HENT:     Head: Normocephalic and atraumatic.     Right Ear: External ear normal.     Left Ear: External ear normal.     Nose: Nose normal.     Mouth/Throat:     Mouth: Mucous membranes are moist.     Pharynx: Oropharynx is clear.  Eyes:     General: No scleral icterus.       Right eye: No discharge.        Left eye: No discharge.     Conjunctiva/sclera: Conjunctivae normal.     Pupils: Pupils are equal, round, and reactive to light.  Pulmonary:     Effort: Pulmonary effort is normal. No respiratory distress.     Comments: Speaking in full sentences Musculoskeletal:        General: Normal range of motion.     Cervical back: Normal range of motion.  Skin:    Coloration: Skin is not jaundiced or pale.     Findings: No bruising, erythema, lesion or rash.  Neurological:     Mental Status: She is alert and oriented to person, place, and time. Mental status is at baseline.  Psychiatric:        Mood  and Affect: Mood normal.        Behavior: Behavior normal.        Thought Content: Thought content normal.        Judgment: Judgment normal.     Results for orders placed or performed in visit on 12/05/23  Bayer DCA Hb A1c Waived   Collection Time: 12/05/23 10:18 AM  Result Value Ref Range   HB A1C (BAYER DCA - WAIVED) 6.0 (H) 4.8 - 5.6 %      Assessment & Plan:   Problem List Items Addressed This Visit   None Visit Diagnoses       Laryngitis    -  Primary   Wil treat with prednisone burst and vocal rest. If not getting better by Friday- will send a message and we'll refer to ENT.        Follow up plan: Return if symptoms worsen or fail  to improve.   This visit was completed via video visit through MyChart due to the restrictions of the COVID-19 pandemic. All issues as above were discussed and addressed. Physical exam was done as above through visual confirmation on video through MyChart. If it was felt that the patient should be evaluated in the office, they were directed there. The patient verbally consented to this visit. Location of the patient: home Location of the provider: work Those involved with this call:  Provider: Olevia Perches, DO CMA: Wilhemena Durie, CMA Front Desk/Registration:  Servando Snare   Time spent on call:  15 minutes with patient face to face via video conference. More than 50% of this time was spent in counseling and coordination of care. 23 minutes total spent in review of patient's record and preparation of their chart.

## 2024-03-26 ENCOUNTER — Telehealth: Admitting: Family Medicine

## 2024-03-28 ENCOUNTER — Encounter: Payer: Self-pay | Admitting: Family Medicine

## 2024-04-02 MED ORDER — AZITHROMYCIN 250 MG PO TABS: ORAL_TABLET | ORAL | 0 refills | Status: AC

## 2024-04-09 ENCOUNTER — Ambulatory Visit: Payer: Self-pay | Admitting: Family Medicine

## 2024-04-12 ENCOUNTER — Ambulatory Visit: Admitting: Family Medicine

## 2024-04-12 ENCOUNTER — Encounter: Payer: Self-pay | Admitting: Family Medicine

## 2024-04-12 VITALS — BP 138/89 | HR 86 | Temp 98.2°F | Ht 63.0 in | Wt 234.6 lb

## 2024-04-12 DIAGNOSIS — J040 Acute laryngitis: Secondary | ICD-10-CM

## 2024-04-12 DIAGNOSIS — G4700 Insomnia, unspecified: Secondary | ICD-10-CM | POA: Diagnosis not present

## 2024-04-12 DIAGNOSIS — R0683 Snoring: Secondary | ICD-10-CM | POA: Diagnosis not present

## 2024-04-12 MED ORDER — FLUTICASONE PROPIONATE 50 MCG/ACT NA SUSP: 2.0000 | Freq: Every day | NASAL | 6 refills | Status: AC

## 2024-04-12 MED ORDER — TRAZODONE HCL 50 MG PO TABS: 25.0000 mg | ORAL_TABLET | Freq: Every evening | ORAL | 2 refills | Status: DC | PRN

## 2024-04-12 NOTE — Progress Notes (Signed)
 BP 138/89 (BP Location: Left Arm, Patient Position: Sitting)   Pulse 86   Temp 98.2 F (36.8 C)   Ht 5' 3 (1.6 m)   Wt 234 lb 9.6 oz (106.4 kg)   LMP  (LMP Unknown)   SpO2 98%   BMI 41.56 kg/m    Subjective:    Patient ID: Amanda Moran, female    DOB: 06/21/1971, 53 y.o.   MRN: [id number]  HPI: SHERRONDA SWEIGERT is a 53 y.o. female  Chief Complaint  Patient presents with   Laryngitis   Insomnia   Has had laryngitis for about a month- a little better with the antibiotic. Feels like it might be getting a little better, but she has not been talking because her voice continues to give out on her and then gives her a headache.   INSOMNIA Duration: since she's been out of work, for a couple of weeks Satisfied with sleep quality: no Difficulty falling asleep: no Difficulty staying asleep: yes Waking a few hours after sleep onset: yes Early morning awakenings: no Daytime hypersomnolence: no Wakes feeling refreshed: no Good sleep hygiene: yes Apnea: no Snoring: yes Depressed/anxious mood: no Recent stress: no Restless legs/nocturnal leg cramps: no Chronic pain/arthritis: no History of sleep study: no Treatments attempted:  trazadone, melatonin, uinsom, and benadryl     Relevant past medical, surgical, family and social history reviewed and updated as indicated. Interim medical history since our last visit reviewed. Allergies and medications reviewed and updated.  Review of Systems  Constitutional: Negative.   HENT:  Positive for congestion, sore throat and voice change. Negative for dental problem, drooling, ear discharge, ear pain, facial swelling, hearing loss, mouth sores, nosebleeds, postnasal drip, rhinorrhea, sinus pressure, sinus pain, sneezing, tinnitus and trouble swallowing.   Eyes:  Positive for itching. Negative for photophobia, pain, discharge, redness and visual disturbance.  Respiratory: Negative.    Cardiovascular: Negative.   Allergic/Immunologic:  Positive for environmental allergies. Negative for food allergies and immunocompromised state.  Psychiatric/Behavioral: Negative.      Per HPI unless specifically indicated above     Objective:    BP 138/89 (BP Location: Left Arm, Patient Position: Sitting)   Pulse 86   Temp 98.2 F (36.8 C)   Ht 5' 3 (1.6 m)   Wt 234 lb 9.6 oz (106.4 kg)   LMP  (LMP Unknown)   SpO2 98%   BMI 41.56 kg/m   Wt Readings from Last 3 Encounters:  04/12/24 234 lb 9.6 oz (106.4 kg)  03/22/24 234 lb (106.1 kg)  02/19/24 237 lb (107.5 kg)    Physical Exam Vitals and nursing note reviewed.  Constitutional:      General: She is not in acute distress.    Appearance: Normal appearance. She is obese. She is not ill-appearing, toxic-appearing or diaphoretic.  HENT:     Head: Normocephalic and atraumatic.     Right Ear: Tympanic membrane, ear canal and external ear normal. There is no impacted cerumen.     Left Ear: Tympanic membrane, ear canal and external ear normal. There is no impacted cerumen.     Nose: Congestion and rhinorrhea present.     Mouth/Throat:     Mouth: Mucous membranes are moist.     Pharynx: Posterior oropharyngeal erythema present. No oropharyngeal exudate.  Eyes:     General: No scleral icterus.       Right eye: No discharge.        Left eye: No discharge.  Extraocular Movements: Extraocular movements intact.     Conjunctiva/sclera: Conjunctivae normal.     Pupils: Pupils are equal, round, and reactive to light.  Cardiovascular:     Rate and Rhythm: Normal rate and regular rhythm.     Pulses: Normal pulses.     Heart sounds: Normal heart sounds. No murmur heard.    No friction rub. No gallop.  Pulmonary:     Effort: Pulmonary effort is normal. No respiratory distress.     Breath sounds: Normal breath sounds. No stridor. No wheezing, rhonchi or rales.  Chest:     Chest wall: No tenderness.  Musculoskeletal:        General: Normal range of motion.     Cervical back:  Normal range of motion and neck supple.  Skin:    General: Skin is warm and dry.     Capillary Refill: Capillary refill takes less than 2 seconds.     Coloration: Skin is not jaundiced or pale.     Findings: No bruising, erythema, lesion or rash.  Neurological:     General: No focal deficit present.     Mental Status: She is alert and oriented to person, place, and time. Mental status is at baseline.  Psychiatric:        Mood and Affect: Mood normal.        Behavior: Behavior normal.        Thought Content: Thought content normal.        Judgment: Judgment normal.     Results for orders placed or performed in visit on 12/05/23  Bayer DCA Hb A1c Waived   Collection Time: 12/05/23 10:18 AM  Result Value Ref Range   HB A1C (BAYER DCA - WAIVED) 6.0 (H) 4.8 - 5.6 %      Assessment & Plan:   Problem List Items Addressed This Visit   None Visit Diagnoses       Laryngitis    -  Primary   Not significantly better. Will get her into ENT. Continue claritin and flonase . Call with any concerns.   Relevant Orders   Ambulatory referral to ENT     Snoring       Possibly due to illness- will recheck next visit. If still having issues with snoring, will get her sleep study.     Insomnia, unspecified type       Will treat with trazadone. Call with any concerns.        Follow up plan: Return in about 6 weeks (around 05/24/2024).

## 2024-04-14 ENCOUNTER — Other Ambulatory Visit: Payer: Self-pay | Admitting: Family Medicine

## 2024-04-15 NOTE — Telephone Encounter (Signed)
 Requested Prescriptions  Pending Prescriptions Disp Refills   Semaglutide -Weight Management (WEGOVY ) 1.7 MG/0.75ML SOAJ [Pharmacy Med Name: WEGOVY  1.7 MG/0.75 ML PEN] 3 mL 1    Sig: INJECT 1.7 MG INTO THE SKIN ONCE A WEEK.     Endocrinology:  Diabetes - GLP-1 Receptor Agonists - semaglutide  Failed - 04/15/2024  2:20 PM      Failed - HBA1C in normal range and within 180 days    Hemoglobin A1C  Date Value Ref Range Status  08/22/2016 5.7  Final   HB A1C (BAYER DCA - WAIVED)  Date Value Ref Range Status  12/05/2023 6.0 (H) 4.8 - 5.6 % Final    Comment:             Prediabetes: 5.7 - 6.4          Diabetes: >6.4          Glycemic control for adults with diabetes: <7.0          Passed - Cr in normal range and within 360 days    Creatinine  Date Value Ref Range Status  04/24/2015 0.61 mg/dL Final    Comment:    1.61-0.96 NOTE: New Reference Range  03/03/15    Creatinine, Ser  Date Value Ref Range Status  06/08/2023 0.76 0.57 - 1.00 mg/dL Final         Passed - Valid encounter within last 6 months    Recent Outpatient Visits           3 days ago Laryngitis   [place] [facility] [place], Megan P, DO   3 weeks ago Laryngitis   [place] [facility] [place], [place], DO   3 weeks ago Laryngitis   [place] [facility] [place], Rankin Buzzard, NP   1 month ago Morbid obesity [facility])    [facility] [place], Malone, DO

## 2024-06-12 ENCOUNTER — Telehealth: Payer: Self-pay

## 2024-06-12 NOTE — Telephone Encounter (Signed)
 Copied from CRM [id number]. Topic: Clinical - Prescription Issue >> Jun 12, 2024 10:33 AM Felizardo Hotter wrote: Reason for CRM: Pt would like Flexeril  called in for back pain. She does not want to schedule appt. [facility] - [place], [place] - [address] [address] Phone: [phone] Fax: [phone]. Please call pt in MyChart.

## 2024-06-13 ENCOUNTER — Ambulatory Visit: Admitting: Family Medicine

## 2024-06-13 ENCOUNTER — Encounter: Payer: Self-pay | Admitting: Family Medicine

## 2024-06-13 VITALS — BP 118/83 | HR 97 | Ht 63.0 in | Wt 226.0 lb

## 2024-06-13 DIAGNOSIS — M545 Low back pain, unspecified: Secondary | ICD-10-CM

## 2024-06-13 MED ORDER — BACLOFEN 10 MG PO TABS: 5.0000 mg | ORAL_TABLET | Freq: Every evening | ORAL | 2 refills | Status: DC | PRN

## 2024-06-13 MED ORDER — ETODOLAC 500 MG PO TABS: 500.0000 mg | ORAL_TABLET | Freq: Two times a day (BID) | ORAL | 0 refills | Status: DC

## 2024-06-13 MED ORDER — KETOROLAC TROMETHAMINE 60 MG/2ML IM SOLN
60.0000 mg | Freq: Once | INTRAMUSCULAR | Status: AC
  Administered 2024-06-13: 60 mg via INTRAMUSCULAR

## 2024-06-13 NOTE — Progress Notes (Signed)
 BP 118/83 (BP Location: Left Arm, Patient Position: Sitting, Cuff Size: Large)   Pulse 97   Ht 5' 3 (1.6 m)   Wt 226 lb (102.5 kg)   LMP  (LMP Unknown)   BMI 40.03 kg/m    Subjective:    Patient ID: Amanda Moran, female    DOB: [birth year], 53 y.o.   MRN: [id number]  HPI: Amanda Moran is a 53 y.o. female  Chief Complaint  Patient presents with   Back Pain    LBP. Onset about a few weeks. Has progressively gotten worse. Can be dull, achy, sharp shooting. No known MOI.    BACK PAIN Duration: couple of weeks Mechanism of injury: no trauma Location: Low back L>R Onset: sudden Severity: 7/10 Quality: dull aching Frequency: constant Radiation: L hip Aggravating factors: moving, lifting her arms Alleviating factors: nothing Status: worse Treatments attempted: tiger balm, rest, tylenol , etodalac  Relief with NSAIDs?: no Nighttime pain:  yes Paresthesias / decreased sensation:  no Bowel / bladder incontinence:  no Fevers:  no Dysuria / urinary frequency:  no   Relevant past medical, surgical, family and social history reviewed and updated as indicated. Interim medical history since our last visit reviewed. Allergies and medications reviewed and updated.  Review of Systems  Constitutional: Negative.   Respiratory: Negative.    Cardiovascular: Negative.   Musculoskeletal:  Positive for back pain and myalgias. Negative for arthralgias, gait problem, joint swelling, neck pain and neck stiffness.  Skin: Negative.   Neurological: Negative.   Psychiatric/Behavioral: Negative.      Per HPI unless specifically indicated above     Objective:    BP 118/83 (BP Location: Left Arm, Patient Position: Sitting, Cuff Size: Large)   Pulse 97   Ht 5' 3 (1.6 m)   Wt 226 lb (102.5 kg)   LMP  (LMP Unknown)   BMI 40.03 kg/m   Wt Readings from Last 3 Encounters:  06/13/24 226 lb (102.5 kg)  04/12/24 234 lb 9.6 oz (106.4 kg)  03/22/24 234 lb (106.1 kg)    Physical  Exam Vitals and nursing note reviewed.  Constitutional:      General: She is not in acute distress.    Appearance: Normal appearance. She is not ill-appearing, toxic-appearing or diaphoretic.  HENT:     Head: Normocephalic and atraumatic.     Right Ear: External ear normal.     Left Ear: External ear normal.     Nose: Nose normal.     Mouth/Throat:     Mouth: Mucous membranes are moist.     Pharynx: Oropharynx is clear.   Eyes:     General: No scleral icterus.       Right eye: No discharge.        Left eye: No discharge.     Extraocular Movements: Extraocular movements intact.     Conjunctiva/sclera: Conjunctivae normal.     Pupils: Pupils are equal, round, and reactive to light.    Cardiovascular:     Rate and Rhythm: Normal rate and regular rhythm.     Pulses: Normal pulses.     Heart sounds: Normal heart sounds. No murmur heard.    No friction rub. No gallop.  Pulmonary:     Effort: Pulmonary effort is normal. No respiratory distress.     Breath sounds: Normal breath sounds. No stridor. No wheezing, rhonchi or rales.  Chest:     Chest wall: No tenderness.   Musculoskeletal:  General: Normal range of motion.     Cervical back: Normal range of motion and neck supple.   Skin:    General: Skin is warm and dry.     Capillary Refill: Capillary refill takes less than 2 seconds.     Coloration: Skin is not jaundiced or pale.     Findings: No bruising, erythema, lesion or rash.   Neurological:     General: No focal deficit present.     Mental Status: She is alert and oriented to person, place, and time. Mental status is at baseline.   Psychiatric:        Mood and Affect: Mood normal.        Behavior: Behavior normal.        Thought Content: Thought content normal.        Judgment: Judgment normal.     Results for orders placed or performed in visit on 12/05/23  Bayer DCA Hb A1c Waived   Collection Time: 12/05/23 10:18 AM  Result Value Ref Range   HB A1C  (BAYER DCA - WAIVED) 6.0 (H) 4.8 - 5.6 %      Assessment & Plan:   Problem List Items Addressed This Visit   None Visit Diagnoses       Acute left-sided low back pain without sciatica    -  Primary   Will treat with toradol  and baclofen  and stretches. Call if not getting better or getting worse and we'll get her into PT. Continue to monitor.   Relevant Medications   etodolac  (LODINE ) 500 MG tablet   baclofen  (LIORESAL ) 10 MG tablet   ketorolac  (TORADOL ) injection 60 mg (Start on 06/13/2024  8:30 AM)        Follow up plan: Return for As scheduled.

## 2024-07-05 ENCOUNTER — Other Ambulatory Visit: Payer: Self-pay | Admitting: Family Medicine

## 2024-07-06 NOTE — Telephone Encounter (Signed)
 Change of pharmacy Requested Prescriptions  Pending Prescriptions Disp Refills   etodolac  (LODINE ) 500 MG tablet [Pharmacy Med Name: ETODOLAC  500 MG TABLET] 120 tablet 0    Sig: TAKE 1 TABLET BY MOUTH TWICE A DAY     Analgesics:  NSAIDS Failed - 07/06/2024 12:47 PM      Failed - Manual Review: Labs are only required if the patient has taken medication for more than 8 weeks.      Failed - Cr in normal range and within 360 days    Creatinine  Date Value Ref Range Status  04/24/2015 0.61 mg/dL Final    Comment:    9.55-8.99 NOTE: New Reference Range  03/03/15    Creatinine, Ser  Date Value Ref Range Status  06/08/2023 0.76 0.57 - 1.00 mg/dL Final         Failed - HGB in normal range and within 360 days    Hemoglobin  Date Value Ref Range Status  06/08/2023 13.8 11.1 - 15.9 g/dL Final         Failed - PLT in normal range and within 360 days    Platelets  Date Value Ref Range Status  06/08/2023 171 150 - 450 x10E3/uL Final         Failed - HCT in normal range and within 360 days    Hematocrit  Date Value Ref Range Status  06/08/2023 42.1 34.0 - 46.6 % Final         Failed - eGFR is 30 or above and within 360 days    EGFR (African American)  Date Value Ref Range Status  04/24/2015 >60  Final   GFR calc Af Amer  Date Value Ref Range Status  04/07/2020 124 >59 mL/min/1.73 Final   EGFR (Non-African Amer.)  Date Value Ref Range Status  04/24/2015 >60  Final    Comment:    eGFR values <33mL/min/1.73 m2 may be an indication of chronic kidney disease (CKD). Calculated eGFR is useful in patients with stable renal function. The eGFR calculation will not be reliable in acutely ill patients when serum creatinine is changing rapidly. It is not useful in patients on dialysis. The eGFR calculation may not be applicable to patients at the low and high extremes of body sizes, pregnant women, and vegetarians.    GFR calc non Af Amer  Date Value Ref Range Status  04/07/2020  107 >59 mL/min/1.73 Final   eGFR  Date Value Ref Range Status  06/08/2023 95 >59 mL/min/1.73 Final         Passed - Patient is not pregnant      Passed - Valid encounter within last 12 months    Recent Outpatient Visits           3 weeks ago Acute left-sided low back pain without sciatica   [place] [facility] [place], Megan P, DO   2 months ago Laryngitis   [place] [facility] [place], [place], DO   3 months ago Laryngitis   [place] [facility] [place], [place], DO   3 months ago Laryngitis   [place] [facility] [place], Angeline ORN, NP   4 months ago Morbid obesity [facility])   [place] [facility] [place], [place], DO

## 2024-07-22 LAB — HM DIABETES EYE EXAM

## 2024-07-23 ENCOUNTER — Other Ambulatory Visit (HOSPITAL_COMMUNITY): Payer: Self-pay

## 2024-07-23 ENCOUNTER — Telehealth: Payer: Self-pay

## 2024-07-23 NOTE — Telephone Encounter (Signed)
 Pharmacy Patient Advocate Encounter   Received notification from CoverMyMeds that prior authorization for Wegovy  2.4MG /0.75ML auto-injectors  is required/requested.   Insurance verification completed.   The patient is insured through [facility] .   Per test claim: PA required; PA submitted to above mentioned insurance via CoverMyMeds Key/confirmation #/EOC [id number] Status is pending

## 2024-07-24 ENCOUNTER — Other Ambulatory Visit (HOSPITAL_COMMUNITY): Payer: Self-pay

## 2024-07-24 NOTE — Telephone Encounter (Signed)
 Pharmacy Patient Advocate Encounter  Received notification from [facility] that Prior Authorization for Wegovy  2.4MG /0.75ML auto-injectors  has been APPROVED from 07/24/2024 to 07/24/2025. Unable to obtain price due to refill too soon rejection, last fill date 07/24/2024 next available fill date08/20/2025.   PA #/Case ID/Reference #: [id number]

## 2024-09-16 ENCOUNTER — Encounter: Payer: Self-pay | Admitting: Family Medicine

## 2024-09-23 ENCOUNTER — Other Ambulatory Visit: Payer: Self-pay | Admitting: Family Medicine

## 2024-09-23 NOTE — Telephone Encounter (Unsigned)
 Copied from CRM #[id number]. Topic: Clinical - Medication Refill >> Sep 23, 2024 10:37 AM Berwyn MATSU wrote: Medication: nicotine  (NICODERM CQ  - DOSED IN MG/24 HOURS) 14 mg/24hr patch; patient would like to restart med has a new start date of 09/25/24.  Has the patient contacted their pharmacy? Yes (Agent: If no, request that the patient contact the pharmacy for the refill. If patient does not wish to contact the pharmacy document the reason why and proceed with request.) (Agent: If yes, when and what did the pharmacy advise?)  This is the patient's preferred pharmacy:   [id number] [place] [facility] [address] [place] [address] Phone: [phone] Fax: [phone]  Is this the correct pharmacy for this prescription? Yes If no, delete pharmacy and type the correct one.   Has the prescription been filled recently? No  Is the patient out of the medication? Yes  Has the patient been seen for an appointment in the last year OR does the patient have an upcoming appointment? Yes  Can we respond through MyChart? Yes  Agent: Please be advised that Rx refills may take up to 3 business days. We ask that you follow-up with your pharmacy.

## 2024-09-25 MED ORDER — NICOTINE 7 MG/24HR TD PT24: 7.0000 mg | MEDICATED_PATCH | Freq: Every day | TRANSDERMAL | 1 refills | Status: DC

## 2024-09-25 MED ORDER — NICOTINE 14 MG/24HR TD PT24: 14.0000 mg | MEDICATED_PATCH | Freq: Every day | TRANSDERMAL | 1 refills | Status: DC

## 2024-09-25 MED ORDER — NICOTINE 21 MG/24HR TD PT24: 21.0000 mg | MEDICATED_PATCH | Freq: Every day | TRANSDERMAL | 1 refills | Status: DC

## 2024-09-25 NOTE — Telephone Encounter (Signed)
 Requested medication (s) are due for refill today: routing for review  Requested medication (s) are on the active medication list: no  Last refill:  09/13/23  Future visit scheduled: no  Notes to clinic:  Unable to refill per protocol, Rx expired. Not on current list, patient is requesting medication.      Requested Prescriptions  Pending Prescriptions Disp Refills   nicotine  (NICODERM CQ  - DOSED IN MG/24 HR) 7 mg/24hr patch 28 patch 3    Sig: Place 1 patch (7 mg total) onto the skin daily.     Psychiatry:  Drug Dependence Therapy Passed - 09/25/2024  9:48 AM      Passed - Valid encounter within last 12 months    Recent Outpatient Visits           3 months ago Acute left-sided low back pain without sciatica   [place] [facility] [place], Megan P, DO   5 months ago Laryngitis   [place] [facility] [place], [place], DO   6 months ago Laryngitis   [place] [facility] [place], [place], DO   6 months ago Laryngitis   [place] [facility] [employer], Angeline ORN, NP   7 months ago Morbid obesity [facility])   [place] [facility] [place], [place], [place]

## 2024-09-25 NOTE — Addendum Note (Signed)
 Addended by: VICCI DUWAINE SQUIBB on: 09/25/2024 10:44 AM   Modules accepted: Orders

## 2024-09-26 ENCOUNTER — Other Ambulatory Visit: Payer: Self-pay | Admitting: Family Medicine

## 2024-09-26 NOTE — Telephone Encounter (Signed)
 Requested Prescriptions  Refused Prescriptions Disp Refills   nicotine  (NICODERM CQ  - DOSED IN MG/24 HOURS) 14 mg/24hr patch [Pharmacy Med Name: NICOTINE  14 MG/24HR PATCH] 14 patch     Sig: APPLY 1 PATCH TO CLEAN/DRY HAIRLESS SKIN EVERY DAY AT THE SAME TIME. REMOVE OLD PATCHES AND ROTATE SITES OF APPLICATION     Psychiatry:  Drug Dependence Therapy Passed - 09/26/2024 12:11 PM      Passed - Valid encounter within last 12 months    Recent Outpatient Visits           3 months ago Acute left-sided low back pain without sciatica   [place] [facility] [place], Megan P, DO   5 months ago Laryngitis   [place] [facility] [place], [place]ld[place], DO   6 months ago Laryngitis   [place] [facility] [place], [place], DO   6 months ago Laryngitis   [place] [facility] [place], Angeline ORN, NP   7 months ago Morbid obesity [facility])   [place] [facility] [place], [place], DO

## 2024-10-12 ENCOUNTER — Other Ambulatory Visit: Payer: Self-pay | Admitting: Family Medicine

## 2024-10-15 NOTE — Telephone Encounter (Signed)
 Requested medications are due for refill today.  unsure  Requested medications are on the active medications list.  Not this dosage - 1.7 is on med list  Last refill. unsure  Future visit scheduled.   no  Notes to clinic.  Unclear if pt is still taking this medication. Per request medication was d/c'd.    Requested Prescriptions  Pending Prescriptions Disp Refills   WEGOVY  2.4 MG/0.75ML SOAJ SQ injection [Pharmacy Med Name: WEGOVY  2.4 MG/0.75 ML PEN]  1    Sig: Inject 2.4 mg into the skin once a week.     Endocrinology:  Diabetes - GLP-1 Receptor Agonists - semaglutide  Failed - 10/15/2024  9:55 AM      Failed - HBA1C in normal range and within 180 days    Hemoglobin A1C  Date Value Ref Range Status  08/22/2016 5.7  Final   HB A1C (BAYER DCA - WAIVED)  Date Value Ref Range Status  12/05/2023 6.0 (H) 4.8 - 5.6 % Final    Comment:             Prediabetes: 5.7 - 6.4          Diabetes: >6.4          Glycemic control for adults with diabetes: <7.0          Failed - Cr in normal range and within 360 days    Creatinine  Date Value Ref Range Status  04/24/2015 0.61 mg/dL Final    Comment:    9.55-8.99 NOTE: New Reference Range  03/03/15    Creatinine, Ser  Date Value Ref Range Status  06/08/2023 0.76 0.57 - 1.00 mg/dL Final         Failed - Valid encounter within last 6 months    Recent Outpatient Visits           4 months ago Acute left-sided low back pain without sciatica   [place] [facility] [place], Megan P, DO   6 months ago Laryngitis   [place] [facility] [place], [place], DO   6 months ago Laryngitis   [place] [facility] [place], [place], DO   6 months ago Laryngitis   [place] [facility] [place], Angeline ORN, NP   7 months ago Morbid obesity [facility])   [facility] [place], [place], DO

## 2024-10-16 ENCOUNTER — Telehealth: Payer: Self-pay

## 2024-10-16 ENCOUNTER — Other Ambulatory Visit: Payer: Self-pay | Admitting: Family Medicine

## 2024-10-16 ENCOUNTER — Other Ambulatory Visit: Payer: Self-pay

## 2024-10-16 MED ORDER — WEGOVY 2.4 MG/0.75ML ~~LOC~~ SOAJ: 2.4000 mg | SUBCUTANEOUS | 0 refills | Status: DC

## 2024-10-16 NOTE — Telephone Encounter (Signed)
 Copied from CRM #[id number]. Topic: Clinical - Prescription Issue >> Oct 16, 2024  4:55 PM Turkey B wrote: Reason for CRM: patient called in checking status of wegovy , first requested 10/15. Patient wanting to now what the hold up is

## 2024-10-16 NOTE — Telephone Encounter (Unsigned)
 Copied from CRM [id number]. Topic: Clinical - Medication Refill >> Oct 16, 2024  9:49 AM Zebedee SAUNDERS wrote: Medication:  WEGOVY  2.4 MG/0.75ML SOAJ SQ injection  Has the patient contacted their pharmacy? Yes (Agent: If no, request that the patient contact the pharmacy for the refill. If patient does not wish to contact the pharmacy document the reason why and proceed with request.) (Agent: If yes, when and what did the pharmacy advise?)  This is the patient's preferred pharmacy:  [facility] - [place] [place] - [address] [place] [address] Phone: [phone] Fax: [phone]  [id number] [place] [place] [facility] [address] [place] [address] Phone: [phone] Fax: [phone]  Is this the correct pharmacy for this prescription? Yes If no, delete pharmacy and type the correct one.   Has the prescription been filled recently? Yes  Is the patient out of the medication? Yes  Has the patient been seen for an appointment in the last year OR does the patient have an upcoming appointment? Yes  Can we respond through MyChart? Yes  Agent: Please be advised that Rx refills may take up to 3 business days. We ask that you follow-up with your pharmacy.

## 2024-10-16 NOTE — Telephone Encounter (Signed)
 Copied from CRM #[id number]. Topic: Clinical - Prescription Issue >> Oct 15, 2024 12:41 PM Sophia H wrote: Reason for CRM: Patient states pharmacy told her that refill for WEGOVY  2.4 MG/0.75ML SOAJ SQ injection was denied. Patient states she takes her injection on Thursdays, needing filled asap. Looks like request was sent in earlier, patient just wants to make sure there won't be any issues filling it, please advise via my chart once/if sent in.   [id number] [place] [place] [facility] [address]

## 2024-10-17 ENCOUNTER — Other Ambulatory Visit: Payer: Self-pay | Admitting: Family Medicine

## 2024-10-17 NOTE — Telephone Encounter (Signed)
 Requested Prescriptions  Refused Prescriptions Disp Refills   semaglutide -weight management (WEGOVY ) 2.4 MG/0.75ML SOAJ SQ injection 3 mL 0    Sig: Inject 2.4 mg into the skin once a week.     Endocrinology:  Diabetes - GLP-1 Receptor Agonists - semaglutide  Failed - 10/17/2024  5:51 PM      Failed - HBA1C in normal range and within 180 days    Hemoglobin A1C  Date Value Ref Range Status  08/22/2016 5.7  Final   HB A1C (BAYER DCA - WAIVED)  Date Value Ref Range Status  12/05/2023 6.0 (H) 4.8 - 5.6 % Final    Comment:             Prediabetes: 5.7 - 6.4          Diabetes: >6.4          Glycemic control for adults with diabetes: <7.0          Failed - Cr in normal range and within 360 days    Creatinine  Date Value Ref Range Status  04/24/2015 0.61 mg/dL Final    Comment:    9.55-8.99 NOTE: New Reference Range  03/03/15    Creatinine, Ser  Date Value Ref Range Status  06/08/2023 0.76 0.57 - 1.00 mg/dL Final         Failed - Valid encounter within last 6 months    Recent Outpatient Visits           4 months ago Acute left-sided low back pain without sciatica   [place] [facility] [place], Megan P, DO   6 months ago Laryngitis   [place] [facility] [place], [place], DO   6 months ago Laryngitis   [place] [facility] [place], [place], DO   6 months ago Laryngitis   [place] [facility] [place], Angeline ORN, NP   8 months ago Morbid obesity [facility])   [place] [place], [place], DO

## 2024-10-17 NOTE — Telephone Encounter (Signed)
 Copied from CRM #[id number]. Topic: Clinical - Prescription Issue >> Oct 17, 2024 12:47 PM Sophia H wrote: Reason for CRM: Patient states her RX for semaglutide -weight management (WEGOVY ) 2.4 MG/0.75ML SOAJ SQ injection never made it to the pharmacy. Advised was sent to [facility]. Patient states that is not the correct pharmacy. Please correct, patient Is supposed to take injection today.   [id number] [place] [place] - [address]

## 2024-10-18 NOTE — Telephone Encounter (Signed)
 [facility]  Requested Prescriptions  Pending Prescriptions Disp Refills   semaglutide -weight management (WEGOVY ) 2.4 MG/0.75ML SOAJ SQ injection [Pharmacy Med Name: WEGOVY  2.4 MG/0.75 ML PEN] 3 mL 1    Sig: INJECT 2.4 MG INTO THE SKIN ONCE A WEEK.     Endocrinology:  Diabetes - GLP-1 Receptor Agonists - semaglutide  Failed - 10/18/2024  3:39 PM      Failed - HBA1C in normal range and within 180 days    Hemoglobin A1C  Date Value Ref Range Status  08/22/2016 5.7  Final   HB A1C (BAYER DCA - WAIVED)  Date Value Ref Range Status  12/05/2023 6.0 (H) 4.8 - 5.6 % Final    Comment:             Prediabetes: 5.7 - 6.4          Diabetes: >6.4          Glycemic control for adults with diabetes: <7.0          Failed - Cr in normal range and within 360 days    Creatinine  Date Value Ref Range Status  04/24/2015 0.61 mg/dL Final    Comment:    9.55-8.99 NOTE: New Reference Range  03/03/15    Creatinine, Ser  Date Value Ref Range Status  06/08/2023 0.76 0.57 - 1.00 mg/dL Final         Failed - Valid encounter within last 6 months    Recent Outpatient Visits           4 months ago Acute left-sided low back pain without sciatica   [place] [facility] [place], Megan P, DO   6 months ago Laryngitis   [place] [facility] [place], [place], DO   6 months ago Laryngitis   [place] [facility] [place], [place], DO   7 months ago Laryngitis   [place] [facility] [place], Angeline ORN, NP   8 months ago Morbid obesity [facility])    [facility] [place], [place], DO

## 2024-10-30 ENCOUNTER — Encounter: Payer: Self-pay | Admitting: Pediatrics

## 2024-10-30 ENCOUNTER — Ambulatory Visit: Admitting: Pediatrics

## 2024-10-30 ENCOUNTER — Ambulatory Visit: Payer: Self-pay | Admitting: Pediatrics

## 2024-10-30 ENCOUNTER — Other Ambulatory Visit: Payer: Self-pay

## 2024-10-30 VITALS — BP 145/85 | HR 79 | Temp 98.5°F | Resp 15 | Ht 62.99 in | Wt 218.8 lb

## 2024-10-30 DIAGNOSIS — J069 Acute upper respiratory infection, unspecified: Secondary | ICD-10-CM | POA: Diagnosis not present

## 2024-10-30 DIAGNOSIS — Z113 Encounter for screening for infections with a predominantly sexual mode of transmission: Secondary | ICD-10-CM | POA: Diagnosis not present

## 2024-10-30 DIAGNOSIS — N949 Unspecified condition associated with female genital organs and menstrual cycle: Secondary | ICD-10-CM | POA: Diagnosis not present

## 2024-10-30 DIAGNOSIS — R030 Elevated blood-pressure reading, without diagnosis of hypertension: Secondary | ICD-10-CM

## 2024-10-30 DIAGNOSIS — N951 Menopausal and female climacteric states: Secondary | ICD-10-CM | POA: Insufficient documentation

## 2024-10-30 DIAGNOSIS — R232 Flushing: Secondary | ICD-10-CM | POA: Diagnosis not present

## 2024-10-30 LAB — POCT RAPID STREP A (OFFICE): Rapid Strep A Screen: NEGATIVE

## 2024-10-30 LAB — POC COVID19/FLU A&B COMBO
Covid Antigen, POC: NEGATIVE
Influenza A Antigen, POC: NEGATIVE
Influenza B Antigen, POC: NEGATIVE

## 2024-10-30 LAB — WET PREP FOR TRICH, YEAST, CLUE
Clue Cell Exam: NEGATIVE
Trichomonas Exam: NEGATIVE
Yeast Exam: NEGATIVE

## 2024-10-30 MED ORDER — PAROXETINE HCL 10 MG PO TABS: 10.0000 mg | ORAL_TABLET | Freq: Every day | ORAL | 2 refills | Status: DC

## 2024-10-30 NOTE — Assessment & Plan Note (Signed)
 Severe menopausal symptoms affecting quality of life. Prefers non-hormonal treatment due to cancer risk concerns. - Prescribed low-dose Paxil for hot flashes and mood changes. - Follow up in 3-4 weeks to assess treatment response.

## 2024-10-30 NOTE — Patient Instructions (Signed)
 For hot flashes: start 10mg  paxil daily

## 2024-10-30 NOTE — Progress Notes (Signed)
 Office Visit  BP (!) 145/85 (BP Location: Left Arm, Patient Position: Sitting, Cuff Size: Large)   Pulse 79   Temp 98.5 F (36.9 C) (Oral)   Resp 15   Ht 5' 2.99 (1.6 m)   Wt 218 lb 12.8 oz (99.2 kg)   LMP  (LMP Unknown)   SpO2 98%   BMI 38.77 kg/m    Subjective:    Patient ID: Amanda Moran, female    DOB: 1971/02/12, 53 y.o.   MRN: [id number]  HPI: Amanda Moran is a 53 y.o. female  Chief Complaint  Patient presents with   Menopause    Hot flashes are pretty bad for her   Sexual health    Has been years since she had a pap and wants to also do STI testing. Unprotected sex a month ago and has since had a smell. Previous BV. Also some pain with intercourse.     Discussed the use of AI scribe software for clinical note transcription with the patient, who gave verbal consent to proceed.  History of Present Illness   Amanda Moran is a 53 year old female who presents with concerns of menopausal symptoms and possible bacterial vaginosis.  She has been experiencing severe menopausal symptoms, including hot flashes both day and night, which cause her to wake up and take cold showers. She feels extremely cold when not experiencing hot flashes. Her last menstrual period was in 2016, following her hysterectomy. Over-the-counter remedies and vitamins have not provided relief. She describes significant emotional lability, frequently crying and feeling emotional without a clear reason. No fatigue is reported, but there is increased emotional sensitivity.  She reports a persistent odor that she suspects might be bacterial vaginosis following unprotected intercourse about a month ago. She has not had a gynecological checkup since her hysterectomy in 2016. She describes a pressure-like sensation in the lower abdomen during intercourse, which occurred once recently. No vaginal dryness is reported.  She has a craving for sugar, consuming it throughout the day despite taking Wegovy , which  helps manage her appetite. She mentions being sexually active with a high libido, which contrasts with her husband's erectile dysfunction due to blood pressure medication.  She notes a scratchy throat and feeling cold, which she associates with an impending illness, and has been taking Theraflu every four hours since the previous day.      Relevant past medical, surgical, family and social history reviewed and updated as indicated. Interim medical history since our last visit reviewed. Allergies and medications reviewed and updated.  ROS per HPI unless specifically indicated above     Objective:    BP (!) 145/85 (BP Location: Left Arm, Patient Position: Sitting, Cuff Size: Large)   Pulse 79   Temp 98.5 F (36.9 C) (Oral)   Resp 15   Ht 5' 2.99 (1.6 m)   Wt 218 lb 12.8 oz (99.2 kg)   LMP  (LMP Unknown)   SpO2 98%   BMI 38.77 kg/m   Wt Readings from Last 3 Encounters:  10/30/24 218 lb 12.8 oz (99.2 kg)  06/13/24 226 lb (102.5 kg)  04/12/24 234 lb 9.6 oz (106.4 kg)     Physical Exam Constitutional:      Appearance: Normal appearance.  Pulmonary:     Effort: Pulmonary effort is normal.  Musculoskeletal:        General: Normal range of motion.  Skin:    Comments: Normal skin color  Neurological:  General: No focal deficit present.     Mental Status: She is alert. Mental status is at baseline.  Psychiatric:        Mood and Affect: Mood normal.        Behavior: Behavior normal.        Thought Content: Thought content normal.         10/30/2024   10:44 AM 06/13/2024    8:09 AM 03/22/2024    8:22 AM 02/19/2024    8:31 AM 10/23/2023    2:53 PM  Depression screen PHQ 2/9  Decreased Interest 0 1 0 0 2  Down, Depressed, Hopeless 0 0 0 0 1  PHQ - 2 Score 0 1 0 0 3  Altered sleeping  1 2 1 2   Tired, decreased energy  1 2 0 1  Change in appetite  1 2 1 2   Feeling bad or failure about yourself   0 0 0 1  Trouble concentrating  0 0 0 0  Moving slowly or fidgety/restless   0 0 0 0  Suicidal thoughts  0 0 0 0  PHQ-9 Score  4 6 2 9   Difficult doing work/chores  Not difficult at all Not difficult at all Not difficult at all Somewhat difficult       06/13/2024    8:10 AM 03/22/2024    8:22 AM 02/19/2024    8:31 AM 10/23/2023    2:54 PM  GAD 7 : Generalized Anxiety Score  Nervous, Anxious, on Edge 0 0 1 2  Control/stop worrying 0 0 1 2  Worry too much - different things 0 1 1 2   Trouble relaxing 1 1 1 2   Restless 0 0 1 1  Easily annoyed or irritable 0 1 1 1   Afraid - awful might happen 0 0 0 0  Total GAD 7 Score 1 3 6 10   Anxiety Difficulty Not difficult at all Not difficult at all Not difficult at all Somewhat difficult       Assessment & Plan:  Assessment & Plan   Vasomotor symptoms due to menopause Assessment & Plan: Severe menopausal symptoms affecting quality of life. Prefers non-hormonal treatment due to cancer risk concerns. - Prescribed low-dose Paxil for hot flashes and mood changes. - Follow up in 3-4 weeks to assess treatment response.  Orders: -     FSH/LH -     Hemoglobin A1c -     Comprehensive metabolic panel with GFR -     Lipid panel -     CBC with Differential/Platelet -     PARoxetine HCl; Take 1 tablet (10 mg total) by mouth daily.  Dispense: 30 tablet; Refill: 2 -     TSH  Vaginal discomfort Screen for STD (sexually transmitted disease) Concern for STIs after unprotected intercourse. S/p hysterectomy. Possible bacterial vaginosis or other infections causing abdominal pressure. - Perform vaginal self-swab for bacterial vaginosis and yeast infections. - Order STI screening tests. - Review results and follow up as needed. -     Chlamydia/Gonococcus/Trichomonas, NAA -     RPR w/reflex to TrepSure -     WET PREP FOR TRICH, YEAST, CLUE -     HIV Antibody (routine testing w rflx)  Elevated blood pressure reading Elevated blood pressure possibly due to recent Theraflu use. Plan to repeat at follow up.  Upper respiratory  tract infection, unspecified type Scratchy throat and potential viral upper respiratory infection. Otherwise well appearing on exam. - Perform nasal swab for influenza and  COVID-19. - Review results and follow up as needed. -     POC Covid19/Flu A&B Antigen -     POCT rapid strep A   Follow up plan: Return in about 3 weeks (around 11/20/2024).  Hadassah SHAUNNA Nett, MD

## 2024-10-31 LAB — COMPREHENSIVE METABOLIC PANEL WITH GFR
ALT: 10 IU/L (ref 0–32)
AST: 13 IU/L (ref 0–40)
Albumin: 4.3 g/dL (ref 3.8–4.9)
Alkaline Phosphatase: 56 IU/L (ref 49–135)
BUN/Creatinine Ratio: 14 (ref 9–23)
BUN: 10 mg/dL (ref 6–24)
Bilirubin Total: 0.3 mg/dL (ref 0.0–1.2)
CO2: 22 mmol/L (ref 20–29)
Calcium: 9.2 mg/dL (ref 8.7–10.2)
Chloride: 108 mmol/L — ABNORMAL HIGH (ref 96–106)
Creatinine, Ser: 0.7 mg/dL (ref 0.57–1.00)
Globulin, Total: 2.6 g/dL (ref 1.5–4.5)
Glucose: 79 mg/dL (ref 70–99)
Potassium: 3.7 mmol/L (ref 3.5–5.2)
Sodium: 145 mmol/L — ABNORMAL HIGH (ref 134–144)
Total Protein: 6.9 g/dL (ref 6.0–8.5)
eGFR: 104 mL/min/1.73 (ref >59)

## 2024-10-31 LAB — CBC WITH DIFFERENTIAL/PLATELET
Basophils Absolute: 0 x10E3/uL (ref 0.0–0.2)
Basos: 0 %
EOS (ABSOLUTE): 0.1 x10E3/uL (ref 0.0–0.4)
Eos: 1 %
Hematocrit: 39.6 % (ref 34.0–46.6)
Hemoglobin: 12.8 g/dL (ref 11.1–15.9)
Immature Grans (Abs): 0 x10E3/uL (ref 0.0–0.1)
Immature Granulocytes: 0 %
Lymphocytes Absolute: 2.1 x10E3/uL (ref 0.7–3.1)
Lymphs: 32 %
MCH: 29.5 pg (ref 26.6–33.0)
MCHC: 32.3 g/dL (ref 31.5–35.7)
MCV: 91 fL (ref 79–97)
Monocytes Absolute: 0.5 x10E3/uL (ref 0.1–0.9)
Monocytes: 7 %
Neutrophils Absolute: 4.1 x10E3/uL (ref 1.4–7.0)
Neutrophils: 60 %
Platelets: 203 x10E3/uL (ref 150–450)
RBC: 4.34 x10E6/uL (ref 3.77–5.28)
RDW: 13 % (ref 11.7–15.4)
WBC: 6.8 x10E3/uL (ref 3.4–10.8)

## 2024-10-31 LAB — LIPID PANEL
Chol/HDL Ratio: 3.9 ratio (ref 0.0–4.4)
Cholesterol, Total: 218 mg/dL — ABNORMAL HIGH (ref 100–199)
HDL: 56 mg/dL (ref >39)
LDL Chol Calc (NIH): 146 mg/dL — ABNORMAL HIGH (ref 0–99)
Triglycerides: 92 mg/dL (ref 0–149)
VLDL Cholesterol Cal: 16 mg/dL (ref 5–40)

## 2024-10-31 LAB — HEMOGLOBIN A1C
Est. average glucose Bld gHb Est-mCnc: 103 mg/dL
Hgb A1c MFr Bld: 5.2 % (ref 4.8–5.6)

## 2024-10-31 LAB — FSH/LH
FSH: 50.1 m[IU]/mL
LH: 29.2 m[IU]/mL

## 2024-10-31 LAB — RPR W/REFLEX TO TREPSURE: RPR: NONREACTIVE

## 2024-10-31 LAB — TREPONEMAL ANTIBODIES, TPPA: Treponemal Antibodies, TPPA: NONREACTIVE

## 2024-10-31 LAB — TSH: TSH: 1.3 u[IU]/mL (ref 0.450–4.500)

## 2024-10-31 LAB — HIV ANTIBODY (ROUTINE TESTING W REFLEX): HIV Screen 4th Generation wRfx: NONREACTIVE

## 2024-10-31 MED ORDER — TRAZODONE HCL 50 MG PO TABS: 25.0000 mg | ORAL_TABLET | Freq: Every evening | ORAL | 0 refills | Status: DC | PRN

## 2024-11-01 LAB — CHLAMYDIA/GONOCOCCUS/TRICHOMONAS, NAA
Chlamydia by NAA: NEGATIVE
Gonococcus by NAA: NEGATIVE
Trich vag by NAA: NEGATIVE

## 2024-11-19 ENCOUNTER — Ambulatory Visit: Admitting: Pediatrics

## 2024-11-23 ENCOUNTER — Other Ambulatory Visit: Payer: Self-pay | Admitting: Pediatrics

## 2024-11-23 DIAGNOSIS — N951 Menopausal and female climacteric states: Secondary | ICD-10-CM

## 2024-11-26 ENCOUNTER — Encounter: Payer: Self-pay | Admitting: Family Medicine

## 2024-11-26 ENCOUNTER — Ambulatory Visit: Admitting: Family Medicine

## 2024-11-26 VITALS — BP 148/90 | HR 68 | Temp 98.0°F | Ht 63.0 in | Wt 217.0 lb

## 2024-11-26 DIAGNOSIS — Z23 Encounter for immunization: Secondary | ICD-10-CM

## 2024-11-26 DIAGNOSIS — N951 Menopausal and female climacteric states: Secondary | ICD-10-CM

## 2024-11-26 DIAGNOSIS — R7301 Impaired fasting glucose: Secondary | ICD-10-CM

## 2024-11-26 DIAGNOSIS — R030 Elevated blood-pressure reading, without diagnosis of hypertension: Secondary | ICD-10-CM | POA: Diagnosis not present

## 2024-11-26 DIAGNOSIS — Z0000 Encounter for general adult medical examination without abnormal findings: Secondary | ICD-10-CM

## 2024-11-26 LAB — MICROALBUMIN, URINE WAIVED
Creatinine, Urine Waived: 300 mg/dL (ref 10–300)
Microalb, Ur Waived: 80 mg/L — ABNORMAL HIGH (ref 0–19)

## 2024-11-26 LAB — BAYER DCA HB A1C WAIVED: HB A1C (BAYER DCA - WAIVED): 5.1 % (ref 4.8–5.6)

## 2024-11-26 MED ORDER — TRAZODONE HCL 50 MG PO TABS: 25.0000 mg | ORAL_TABLET | Freq: Every evening | ORAL | 0 refills | Status: AC | PRN

## 2024-11-26 MED ORDER — PAROXETINE HCL 20 MG PO TABS: 20.0000 mg | ORAL_TABLET | Freq: Every day | ORAL | 2 refills | Status: AC

## 2024-11-26 MED ORDER — NALTREXONE HCL 50 MG PO TABS: 25.0000 mg | ORAL_TABLET | Freq: Every day | ORAL | 1 refills | Status: AC

## 2024-11-26 MED ORDER — BUPROPION HCL ER (SR) 150 MG PO TB12: ORAL_TABLET | ORAL | 1 refills | Status: AC

## 2024-11-26 NOTE — Progress Notes (Signed)
 BP (!) 148/90   Pulse 68   Temp 98 F (36.7 C) (Oral)   Ht 5' 3 (1.6 m)   Wt 217 lb (98.4 kg)   LMP  (LMP Unknown)   SpO2 97%   BMI 38.44 kg/m    Subjective:    Patient ID: Amanda Moran, female    DOB: Sep 03, 1971, 53 y.o.   MRN: [id number]  HPI: Amanda Moran is a 53 y.o. female presenting on 11/26/2024 for comprehensive medical examination. Current medical complaints include:  OBESITY Duration: chronic Previous attempts at weight loss: has been cutting back on calories, eating a calorie deficit, has been walking 30 minutes 1-2x a day  Complications of obesity: IFG Peak weight: 310lbs (253 when starting wegovy ) Weight loss goal: 200lbs Weight loss to date: 93lbs (36 since starting wegovy ) Requesting obesity pharmacotherapy: yes Current weight loss supplements/medications: yes Previous weight loss supplements/meds: yes- contrave  and wegovy   MENOPAUSAL SYMPTOMS Duration: chronic  Status: uncontrolled Symptom severity: severe Hot flashes: yes Night sweats: yes Sleep disturbances: yes Vaginal dryness: yes Dyspareunia:yes Decreased libido: no Emotional lability: yes Stress incontinence: yes Previous HRT/pharmacotherapy: no Hysterectomy: yes Absolute Contraindications to Hormonal Therapy:     Undiagnosed vaginal bleeding: no    Breast cancer: no    Endometrial cancer: no    Coronary disease: no    Cerebrovascular disease: no    Venous thromboembolic disease: no  Menopausal Symptoms: yes  Depression Screen done today and results listed below:     11/26/2024    2:16 PM 10/30/2024   10:44 AM 06/13/2024    8:09 AM 03/22/2024    8:22 AM 02/19/2024    8:31 AM  Depression screen PHQ 2/9  Decreased Interest 1 0 1 0 0  Down, Depressed, Hopeless 1 0 0 0 0  PHQ - 2 Score 2 0 1 0 0  Altered sleeping 2  1 2 1   Tired, decreased energy 1  1 2  0  Change in appetite 0  1 2 1   Feeling bad or failure about yourself  0  0 0 0  Trouble concentrating 1  0 0 0  Moving slowly  or fidgety/restless 0  0 0 0  Suicidal thoughts 0  0 0 0  PHQ-9 Score 6  4  6  2    Difficult doing work/chores Somewhat difficult  Not difficult at all Not difficult at all Not difficult at all     Data saved with a previous flowsheet row definition     Past Medical History:  Past Medical History:  Diagnosis Date   Abnormal uterine bleeding 05/20/2015   Overview:  Hgb 7.9 on 5/19 preoperatively. At [facility] preop.    Acanthosis nigricans    Anemia    Anxiety    Diabetes mellitus without complication (HCC)    patient unaware   Fibroids    Fibroids    Headache    Iron deficiency anemia     Surgical History:  Past Surgical History:  Procedure Laterality Date   ABDOMINAL HYSTERECTOMY  05/29/2015   Procedure: HYSTERECTOMY ABDOMINAL;  Surgeon: Alm Ozell Eagles, MD;  Location: [facility];  Service: Gynecology;;   ACHILLES TENDON SURGERY Left 06/07/2019   Procedure: ACHILLES REPAIR SECONDARY LEFT;  Surgeon: Ashley Soulier, DPM;  Location: [facility];  Service: Podiatry;  Laterality: Left;   ACHILLES TENDON SURGERY Right 08/30/2019   Procedure: ACHILLES REPAIR SECONDARY RIGHT;  Surgeon: Ashley Soulier, DPM;  Location: [facility];  Service: Podiatry;  Laterality: Right;  BREAST BIOPSY Left 10/04/2017   us  bx /clip-neg   CALCANEAL OSTEOTOMY Right 08/30/2019   Procedure: HAGLUNDS/RETROCALCANEAL (OSTECTOMY) RIGHT;  Surgeon: Ashley Soulier, DPM;  Location: [facility];  Service: Podiatry;  Laterality: Right;   CHOLECYSTECTOMY     COLONOSCOPY WITH PROPOFOL  N/A 07/12/2023   Procedure: COLONOSCOPY WITH PROPOFOL ;  Surgeon: Jinny Carmine, MD;  Location: [facility];  Service: Endoscopy;  Laterality: N/A;   KNEE ARTHROSCOPY Right    KNEE ARTHROSCOPY WITH ANTERIOR CRUCIATE LIGAMENT (ACL) REPAIR WITH HAMSTRING GRAFT Right 02/14/2017   Procedure: KNEE ARTHROSCOPY WITH ANTERIOR CRUCIATE LIGAMENT (ACL) REPAIR WITH HAMSTRING GRAFT;  Surgeon: Franky Cranker, MD;  Location: [facility];  Service: Orthopedics;   Laterality: Right;   LAPAROSCOPIC SUPRACERVICAL HYSTERECTOMY N/A 05/29/2015   Procedure: LAPAROSCOPIC SUPRACERVICAL HYSTERECTOMY;  Surgeon: Alm Ozell Eagles, MD;  Location: [facility];  Service: Gynecology;  Laterality: N/A;  attempted   OSTECTOMY Left 06/07/2019   Procedure: HAGLUNDS/RETROCALCANEAL OSTECTOMY;  Surgeon: Ashley Soulier, DPM;  Location: [facility];  Service: Podiatry;  Laterality: Left;   POLYPECTOMY  07/12/2023   Procedure: POLYPECTOMY;  Surgeon: Jinny Carmine, MD;  Location: [facility];  Service: Endoscopy;;   TUBAL LIGATION      Medications:  Current Outpatient Medications on File Prior to Visit  Medication Sig   diclofenac  Sodium (VOLTAREN ) 1 % GEL Apply 4 g topically 4 (four) times daily.   etodolac  (LODINE ) 500 MG tablet TAKE 1 TABLET BY MOUTH TWICE A DAY   fluticasone  (FLONASE ) 50 MCG/ACT nasal spray Place 2 sprays into both nostrils daily.   hydrOXYzine  (ATARAX ) 25 MG tablet TAKE 1 TABLET BY MOUTH EVERY 8 HOURS AS NEEDED FOR ANXIETY   semaglutide -weight management (WEGOVY ) 2.4 MG/0.75ML SOAJ SQ injection INJECT 2.4 MG INTO THE SKIN ONCE A WEEK.   No current facility-administered medications on file prior to visit.    Allergies:  No Known Allergies  Social History:  Social History   Socioeconomic History   Marital status: Married    Spouse name: Not on file   Number of children: Not on file   Years of education: Not on file   Highest education level: Not on file  Occupational History   Not on file  Tobacco Use   Smoking status: Former    Current packs/day: 1.50    Types: Cigarettes   Smokeless tobacco: Never  Vaping Use   Vaping status: Never Used  Substance and Sexual Activity   Alcohol use: Yes    Comment: socially   Drug use: No   Sexual activity: Yes  Other Topics Concern   Not on file  Social History Narrative   Not on file   Social Drivers of Health   Financial Resource Strain: Not on file  Food Insecurity: Not on file   Transportation Needs: Not on file  Physical Activity: Not on file  Stress: Not on file  Social Connections: Not on file  Intimate Partner Violence: Not on file   Social History   Tobacco Use  Smoking Status Former   Current packs/day: 1.50   Types: Cigarettes  Smokeless Tobacco Never   Social History   Substance and Sexual Activity  Alcohol Use Yes   Comment: socially    Family History:  Family History  Problem Relation Age of Onset   Alcohol abuse Father    Arthritis Father    Diabetes Father    Drug abuse Father    Cancer Father        pancreatic   Diabetes Brother  Cancer Mother        cervical   Breast cancer Maternal Aunt    Breast cancer Cousin        mat cousins    Past medical history, surgical history, medications, allergies, family history and social history reviewed with patient today and changes made to appropriate areas of the chart.   Review of Systems  Constitutional:  Positive for diaphoresis. Negative for chills, fever, malaise/fatigue and weight loss.  HENT: Negative.    Eyes: Negative.   Respiratory: Negative.    Cardiovascular: Negative.   Gastrointestinal: Negative.   Genitourinary: Negative.   Musculoskeletal: Negative.   Skin: Negative.   Neurological: Negative.   Endo/Heme/Allergies: Negative.   Psychiatric/Behavioral:  Positive for depression. Negative for hallucinations, memory loss, substance abuse and suicidal ideas. The patient is not nervous/anxious and does not have insomnia.    All other ROS negative except what is listed above and in the HPI.      Objective:    BP (!) 148/90   Pulse 68   Temp 98 F (36.7 C) (Oral)   Ht 5' 3 (1.6 m)   Wt 217 lb (98.4 kg)   LMP  (LMP Unknown)   SpO2 97%   BMI 38.44 kg/m   Wt Readings from Last 3 Encounters:  11/26/24 217 lb (98.4 kg)  10/30/24 218 lb 12.8 oz (99.2 kg)  06/13/24 226 lb (102.5 kg)    Physical Exam Vitals and nursing note reviewed.  Constitutional:       General: She is not in acute distress.    Appearance: Normal appearance. She is not ill-appearing, toxic-appearing or diaphoretic.  HENT:     Head: Normocephalic and atraumatic.     Right Ear: Tympanic membrane, ear canal and external ear normal. There is no impacted cerumen.     Left Ear: Tympanic membrane, ear canal and external ear normal. There is no impacted cerumen.     Nose: Nose normal. No congestion or rhinorrhea.     Mouth/Throat:     Mouth: Mucous membranes are moist.     Pharynx: Oropharynx is clear. No oropharyngeal exudate or posterior oropharyngeal erythema.  Eyes:     General: No scleral icterus.       Right eye: No discharge.        Left eye: No discharge.     Extraocular Movements: Extraocular movements intact.     Conjunctiva/sclera: Conjunctivae normal.     Pupils: Pupils are equal, round, and reactive to light.  Neck:     Vascular: No carotid bruit.  Cardiovascular:     Rate and Rhythm: Normal rate and regular rhythm.     Pulses: Normal pulses.     Heart sounds: No murmur heard.    No friction rub. No gallop.  Pulmonary:     Effort: Pulmonary effort is normal. No respiratory distress.     Breath sounds: Normal breath sounds. No stridor. No wheezing, rhonchi or rales.  Chest:     Chest wall: No tenderness.  Abdominal:     General: Abdomen is flat. Bowel sounds are normal. There is no distension.     Palpations: Abdomen is soft. There is no mass.     Tenderness: There is no abdominal tenderness. There is no right CVA tenderness, left CVA tenderness, guarding or rebound.     Hernia: No hernia is present.  Genitourinary:    Comments: Breast and pelvic exams deferred with shared decision making Musculoskeletal:  General: No swelling, tenderness, deformity or signs of injury.     Cervical back: Normal range of motion and neck supple. No rigidity. No muscular tenderness.     Right lower leg: No edema.     Left lower leg: No edema.  Lymphadenopathy:      Cervical: No cervical adenopathy.  Skin:    General: Skin is warm and dry.     Capillary Refill: Capillary refill takes less than 2 seconds.     Coloration: Skin is not jaundiced or pale.     Findings: No bruising, erythema, lesion or rash.  Neurological:     General: No focal deficit present.     Mental Status: She is alert and oriented to person, place, and time. Mental status is at baseline.     Cranial Nerves: No cranial nerve deficit.     Sensory: No sensory deficit.     Motor: No weakness.     Coordination: Coordination normal.     Gait: Gait normal.     Deep Tendon Reflexes: Reflexes normal.  Psychiatric:        Mood and Affect: Mood normal.        Behavior: Behavior normal.        Thought Content: Thought content normal.        Judgment: Judgment normal.     Results for orders placed or performed in visit on 11/26/24  Bayer DCA Hb A1c Waived   Collection Time: 11/26/24  2:20 PM  Result Value Ref Range   HB A1C (BAYER DCA - WAIVED) 5.1 4.8 - 5.6 %  Microalbumin, Urine Waived   Collection Time: 11/26/24  3:00 PM  Result Value Ref Range   Microalb, Ur Waived 80 (H) 0 - 19 mg/L   Creatinine, Urine Waived 300 10 - 300 mg/dL   Microalb/Creat Ratio 30-300 (H) <30 mg/g      Assessment & Plan:   Problem List Items Addressed This Visit       Endocrine   IFG (impaired fasting glucose)   A1c doing great at 5.1. Continue diet and exercise. Continue to monitor. Call with any concerns.       Relevant Orders   Bayer DCA Hb A1c Waived (Completed)     Other   Morbid obesity (HCC)   Insurance will no longer cover her wegovy . Will finish her current supply and then start on wellbutrin  and contrave . Recheck in about 8 weeks. Call with any concerns.       Vasomotor symptoms due to menopause   Will increase her paxil  to 20mg  and recheck in about 8 weeks. Call with any concerns.       Relevant Medications   PARoxetine  (PAXIL ) 20 MG tablet   Other Visit Diagnoses        Routine general medical examination at a health care facility    -  Primary   Vaccines updated. Screening labs checked today. Pap N/A. Mammo and colonoscopy up to date. Continue diet and exercise. Call with any concerns.   Relevant Orders   Comprehensive metabolic panel with GFR   Hepatitis B surface antibody,quantitative   Bayer DCA Hb A1c Waived (Completed)     Elevated blood pressure reading without diagnosis of hypertension       Will work on [employer] and weight loss for 2 months. If BP still high given +microalbumin will start medication. Call with any cocnerns.   Relevant Orders   Microalbumin, Urine Waived (Completed)     Needs  flu shot       Flu shot given today.   Relevant Orders   Flu vaccine trivalent PF, 6mos and older(Flulaval,Afluria,Fluarix,Fluzone) (Completed)     Need for pneumococcal 20-valent conjugate vaccination       Prevnar given today.   Relevant Orders   Pneumococcal conjugate vaccine 20-valent (Prevnar 20) (Completed)        Follow up plan: Return in about 2 months (around 01/27/2025).   LABORATORY TESTING:  - Pap smear: not applicable  IMMUNIZATIONS:   - Tdap: Tetanus vaccination status reviewed: last tetanus booster within 10 years. - Influenza: Administered today - Prevnar: Administered today - COVID: Refused - HPV: Not applicable - Shingrix  vaccine: Up to date  SCREENING: -Mammogram: Up to date  - Colonoscopy: Up to date   PATIENT COUNSELING:   Advised to take 1 mg of folate supplement per day if capable of pregnancy.   Sexuality: Discussed sexually transmitted diseases, partner selection, use of condoms, avoidance of unintended pregnancy  and contraceptive alternatives.   Advised to avoid cigarette smoking.  I discussed with the patient that most people either abstain from alcohol or drink within safe limits (<=14/week and <=4 drinks/occasion for males, <=7/weeks and <= 3 drinks/occasion for females) and that the risk for alcohol disorders  and other health effects rises proportionally with the number of drinks per week and how often a drinker exceeds daily limits.  Discussed cessation/primary prevention of drug use and availability of treatment for abuse.   Diet: Encouraged to adjust caloric intake to maintain  or achieve ideal body weight, to reduce intake of dietary saturated fat and total fat, to limit sodium intake by avoiding high sodium foods and not adding table salt, and to maintain adequate dietary potassium and calcium preferably from fresh fruits, vegetables, and low-fat dairy products.    stressed the importance of regular exercise  Injury prevention: Discussed safety belts, safety helmets, smoke detector, smoking near bedding or upholstery.   Dental health: Discussed importance of regular tooth brushing, flossing, and dental visits.    NEXT PREVENTATIVE PHYSICAL DUE IN 1 YEAR. Return in about 2 months (around 01/27/2025).

## 2024-11-26 NOTE — Assessment & Plan Note (Signed)
 A1c doing great at 5.1. Continue diet and exercise. Continue to monitor. Call with any concerns.

## 2024-11-26 NOTE — Assessment & Plan Note (Addendum)
 Will increase her paxil  to 20mg  and recheck in about 8 weeks. Call with any concerns.

## 2024-11-26 NOTE — Assessment & Plan Note (Signed)
 Insurance will no longer cover her wegovy . Will finish her current supply and then start on wellbutrin and contrave . Recheck in about 8 weeks. Call with any concerns.

## 2024-11-27 ENCOUNTER — Ambulatory Visit: Payer: Self-pay | Admitting: Family Medicine

## 2024-11-27 LAB — COMPREHENSIVE METABOLIC PANEL WITH GFR
ALT: 10 IU/L (ref 0–32)
AST: 15 IU/L (ref 0–40)
Albumin: 4.2 g/dL (ref 3.8–4.9)
Alkaline Phosphatase: 55 IU/L (ref 49–135)
BUN/Creatinine Ratio: 16 (ref 9–23)
BUN: 12 mg/dL (ref 6–24)
Bilirubin Total: 0.3 mg/dL (ref 0.0–1.2)
CO2: 24 mmol/L (ref 20–29)
Calcium: 9.2 mg/dL (ref 8.7–10.2)
Chloride: 105 mmol/L (ref 96–106)
Creatinine, Ser: 0.73 mg/dL (ref 0.57–1.00)
Globulin, Total: 2.7 g/dL (ref 1.5–4.5)
Glucose: 89 mg/dL (ref 70–99)
Potassium: 3.7 mmol/L (ref 3.5–5.2)
Sodium: 143 mmol/L (ref 134–144)
Total Protein: 6.9 g/dL (ref 6.0–8.5)
eGFR: 99 mL/min/1.73 (ref >59)

## 2024-11-27 LAB — HEPATITIS B SURFACE ANTIBODY, QUANTITATIVE: Hepatitis B Surf Ab Quant: 82.2 m[IU]/mL

## 2024-12-24 ENCOUNTER — Telehealth: Payer: Self-pay

## 2024-12-24 NOTE — Telephone Encounter (Signed)
 Patient is not with this practice.

## 2024-12-24 NOTE — Telephone Encounter (Signed)
 Copied from CRM #[id number]. Topic: Clinical - Medication Question >> Dec 24, 2024 12:58 PM Rachelle R wrote: Reason for CRM: Patient would like to know if she should wait to take up the medication she was given. Patient has 2 more shots of wegovy  left, should she take the pills now or wait and take them after the wegovy  is out.  Patient can be reached at [phone] or okay to send message in MyChart

## 2025-01-03 ENCOUNTER — Other Ambulatory Visit: Payer: Self-pay | Admitting: Family Medicine

## 2025-01-03 NOTE — Telephone Encounter (Signed)
 Requested Prescriptions  Pending Prescriptions Disp Refills   WEGOVY  2.4 MG/0.75ML SOAJ SQ injection [Pharmacy Med Name: WEGOVY  2.4 MG/0.75 ML PEN] 3 mL 1    Sig: INJECT 2.4 MG INTO THE SKIN ONCE A WEEK.     Endocrinology:  Diabetes - GLP-1 Receptor Agonists - semaglutide  Passed - 01/03/2025  3:26 PM      Passed - HBA1C in normal range and within 180 days    Hemoglobin A1C  Date Value Ref Range Status  08/22/2016 5.7  Final   HB A1C (BAYER DCA - WAIVED)  Date Value Ref Range Status  11/26/2024 5.1 4.8 - 5.6 % Final    Comment:             Prediabetes: 5.7 - 6.4          Diabetes: >6.4          Glycemic control for adults with diabetes: <7.0          Passed - Cr in normal range and within 360 days    Creatinine  Date Value Ref Range Status  04/24/2015 0.61 mg/dL Final    Comment:    9.55-8.99 NOTE: New Reference Range  03/03/15    Creatinine, Ser  Date Value Ref Range Status  11/26/2024 0.73 0.57 - 1.00 mg/dL Final         Passed - Valid encounter within last 6 months    Recent Outpatient Visits           1 month ago Routine general medical examination at a health care facility   [facility] [place], Megan P, DO   2 months ago Vasomotor symptoms due to menopause   [place] [facility] Hadassah SQUIBB, MD   6 months ago Acute left-sided low back pain without sciatica   [facility] [place], Megan P, DO   8 months ago Laryngitis   [facility] [place], Megan P, DO   9 months ago Laryngitis   [place] [place], [place], [place]

## 2025-01-27 ENCOUNTER — Ambulatory Visit: Admitting: Family Medicine

## 2025-01-31 ENCOUNTER — Other Ambulatory Visit: Payer: Self-pay | Admitting: Family Medicine

## 2025-03-17 ENCOUNTER — Ambulatory Visit: Admitting: Family Medicine
